# Patient Record
Sex: Female | Born: 1980
Health system: Southern US, Community
[De-identification: ages and names within clinical notes are randomized; demographics above are authoritative.]

## PROBLEM LIST (undated history)

## (undated) DIAGNOSIS — F419 Anxiety disorder, unspecified: Secondary | ICD-10-CM

## (undated) DIAGNOSIS — F329 Major depressive disorder, single episode, unspecified: Secondary | ICD-10-CM

## (undated) DIAGNOSIS — I10 Essential (primary) hypertension: Secondary | ICD-10-CM

## (undated) DIAGNOSIS — F32A Depression, unspecified: Secondary | ICD-10-CM

## (undated) HISTORY — PX: SPINE SURGERY: SHX786

## (undated) HISTORY — DX: Major depressive disorder, single episode, unspecified: F32.9

## (undated) HISTORY — DX: Depression, unspecified: F32.A

## (undated) HISTORY — PX: DILATION AND CURETTAGE OF UTERUS: SHX78

## (undated) HISTORY — DX: Essential (primary) hypertension: I10

## (undated) HISTORY — DX: Anxiety disorder, unspecified: F41.9

---
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
## 2004-01-18 ENCOUNTER — Ambulatory Visit: Payer: Self-pay | Admitting: Family Medicine

## 2004-05-09 ENCOUNTER — Ambulatory Visit: Payer: Self-pay | Admitting: Family Medicine

## 2004-05-12 ENCOUNTER — Ambulatory Visit: Payer: Self-pay | Admitting: *Deleted

## 2004-08-19 ENCOUNTER — Ambulatory Visit: Payer: Self-pay | Admitting: Family Medicine

## 2004-10-01 ENCOUNTER — Ambulatory Visit: Payer: Self-pay | Admitting: Internal Medicine

## 2004-10-03 ENCOUNTER — Ambulatory Visit (HOSPITAL_COMMUNITY): Admission: RE | Admit: 2004-10-03 | Discharge: 2004-10-03 | Payer: Self-pay | Admitting: Internal Medicine

## 2004-10-10 ENCOUNTER — Ambulatory Visit: Payer: Self-pay | Admitting: Family Medicine

## 2005-03-05 ENCOUNTER — Other Ambulatory Visit: Admission: RE | Admit: 2005-03-05 | Discharge: 2005-03-05 | Payer: Self-pay | Admitting: Obstetrics and Gynecology

## 2005-04-24 ENCOUNTER — Encounter (INDEPENDENT_AMBULATORY_CARE_PROVIDER_SITE_OTHER): Payer: Self-pay | Admitting: *Deleted

## 2005-04-24 ENCOUNTER — Ambulatory Visit (HOSPITAL_COMMUNITY): Admission: RE | Admit: 2005-04-24 | Discharge: 2005-04-24 | Payer: Self-pay | Admitting: Obstetrics and Gynecology

## 2005-08-25 ENCOUNTER — Ambulatory Visit: Payer: Self-pay | Admitting: Family Medicine

## 2005-10-22 ENCOUNTER — Ambulatory Visit: Payer: Self-pay | Admitting: Family Medicine

## 2006-02-10 ENCOUNTER — Ambulatory Visit: Payer: Self-pay | Admitting: Internal Medicine

## 2006-02-12 ENCOUNTER — Ambulatory Visit: Payer: Self-pay | Admitting: Family Medicine

## 2006-12-23 ENCOUNTER — Ambulatory Visit: Payer: Self-pay | Admitting: Family Medicine

## 2007-09-23 ENCOUNTER — Ambulatory Visit (HOSPITAL_COMMUNITY): Admission: RE | Admit: 2007-09-23 | Discharge: 2007-09-23 | Payer: Self-pay | Admitting: Obstetrics and Gynecology

## 2007-10-06 ENCOUNTER — Ambulatory Visit (HOSPITAL_COMMUNITY): Admission: RE | Admit: 2007-10-06 | Discharge: 2007-10-06 | Payer: Self-pay | Admitting: Obstetrics and Gynecology

## 2007-10-06 ENCOUNTER — Encounter (HOSPITAL_COMMUNITY): Payer: Self-pay | Admitting: Obstetrics and Gynecology

## 2008-06-22 ENCOUNTER — Emergency Department (HOSPITAL_COMMUNITY): Admission: EM | Admit: 2008-06-22 | Discharge: 2008-06-22 | Payer: Self-pay | Admitting: Family Medicine

## 2008-08-06 ENCOUNTER — Emergency Department (HOSPITAL_COMMUNITY): Admission: EM | Admit: 2008-08-06 | Discharge: 2008-08-06 | Payer: Self-pay | Admitting: Family Medicine

## 2009-02-04 ENCOUNTER — Emergency Department (HOSPITAL_COMMUNITY): Admission: EM | Admit: 2009-02-04 | Discharge: 2009-02-04 | Payer: Self-pay | Admitting: Family Medicine

## 2010-04-20 LAB — POCT URINALYSIS DIP (DEVICE)
Bilirubin Urine: NEGATIVE
Glucose, UA: NEGATIVE mg/dL
Hgb urine dipstick: NEGATIVE
Ketones, ur: NEGATIVE mg/dL
Nitrite: NEGATIVE
Protein, ur: NEGATIVE mg/dL
Specific Gravity, Urine: 1.025 (ref 1.005–1.030)
Urobilinogen, UA: 0.2 mg/dL (ref 0.0–1.0)
pH: 5.5 (ref 5.0–8.0)

## 2010-04-20 LAB — TSH: TSH: 2.052 u[IU]/mL (ref 0.350–4.500)

## 2010-04-20 LAB — DIFFERENTIAL
Basophils Absolute: 0 10*3/uL (ref 0.0–0.1)
Basophils Relative: 1 % (ref 0–1)
Eosinophils Absolute: 0.4 10*3/uL (ref 0.0–0.7)
Eosinophils Relative: 9 % — ABNORMAL HIGH (ref 0–5)
Lymphocytes Relative: 41 % (ref 12–46)
Lymphs Abs: 2.1 10*3/uL (ref 0.7–4.0)
Monocytes Absolute: 0.4 10*3/uL (ref 0.1–1.0)
Monocytes Relative: 9 % (ref 3–12)
Neutro Abs: 2.1 10*3/uL (ref 1.7–7.7)
Neutrophils Relative %: 41 % — ABNORMAL LOW (ref 43–77)

## 2010-04-20 LAB — CBC
HCT: 38.3 % (ref 36.0–46.0)
Hemoglobin: 13 g/dL (ref 12.0–15.0)
MCHC: 34 g/dL (ref 30.0–36.0)
MCV: 88.7 fL (ref 78.0–100.0)
Platelets: 214 10*3/uL (ref 150–400)
RBC: 4.32 MIL/uL (ref 3.87–5.11)
RDW: 13.9 % (ref 11.5–15.5)
WBC: 5.1 10*3/uL (ref 4.0–10.5)

## 2010-04-20 LAB — GC/CHLAMYDIA PROBE AMP, GENITAL
Chlamydia, DNA Probe: NEGATIVE
GC Probe Amp, Genital: NEGATIVE

## 2010-04-20 LAB — WET PREP, GENITAL
Clue Cells Wet Prep HPF POC: NONE SEEN
Trich, Wet Prep: NONE SEEN
WBC, Wet Prep HPF POC: NONE SEEN
Yeast Wet Prep HPF POC: NONE SEEN

## 2010-04-20 LAB — POCT PREGNANCY, URINE: Preg Test, Ur: NEGATIVE

## 2010-05-11 LAB — GC/CHLAMYDIA PROBE AMP, GENITAL
Chlamydia, DNA Probe: NEGATIVE
GC Probe Amp, Genital: POSITIVE — AB

## 2010-05-11 LAB — POCT PREGNANCY, URINE: Preg Test, Ur: NEGATIVE

## 2010-05-11 LAB — WET PREP, GENITAL: Trich, Wet Prep: NONE SEEN

## 2010-05-13 LAB — POCT PREGNANCY, URINE: Preg Test, Ur: NEGATIVE

## 2010-05-13 LAB — POCT RAPID STREP A (OFFICE): Streptococcus, Group A Screen (Direct): NEGATIVE

## 2010-06-17 NOTE — Op Note (Signed)
NAMESHAVON, ASHMORE                 ACCOUNT NO.:  [id number]   MEDICAL RECORD NO.:  [id number]          PATIENT TYPE:  AMB   LOCATION:  SDC                           FACILITY:  [facility]   PHYSICIAN:  Zelphia Cairo, MD    DATE OF BIRTH:  01/21/1981   DATE OF PROCEDURE:  DATE OF DISCHARGE:                               OPERATIVE REPORT   PREOPERATIVE DIAGNOSIS:  Anembryonic pregnancy.   POSTOPERATIVE DIAGNOSIS:  Anembryonic pregnancy.   PROCEDURES:  1. Cervical block.  2. Suction dilation and evacuation.   SURGEON:  Zelphia Cairo, MD   ANESTHESIA:  MAC and local.   SPECIMENS:  Products of conception to Pathology.   ESTIMATED BLOOD LOSS:  Minimal.   COMPLICATIONS:  None.   CONDITION:  Stable to recovery room.   PROCEDURE IN DETAIL:  The patient was taken to the operating room where  anesthesia was found to be adequate.  She was placed in the dorsal  lithotomy position using Allen stirrups. She was prepped and draped in  sterile fashion and a red rubber catheter was used to drain her bladder  for approximately 30 mL of clear urine.  Bivalve speculum was placed in  the vagina and a single-tooth tenaculum was placed on the anterior lip  of the cervix.  Cervical block was provided using Nesacaine.  The cervix  was then serially dilated with Shawnie Pons dilators and a 7-French suction  catheter was inserted into the uterine cavity and products of conception  were suctioned from the uterus.  A general curetting was then performed  to ensure uterine cry.  Suction catheter was reinserted to clear any  clots and debris.  Single-tooth tenaculum was removed from the cervix.  The cervix was found to be hemostatic.  Speculum was removed and the  patient was taken to the recovery room in stable condition.      Zelphia Cairo, MD  Electronically Signed     GA/MEDQ  D:  10/06/2007  T:  10/07/2007  Job:  [id number]

## 2010-06-20 NOTE — Op Note (Signed)
[name], [name]                 ACCOUNT NO.:  [id number]   MEDICAL RECORD NO.:  [id number]          PATIENT TYPE:  AMB   LOCATION:  SDC                           [facility]ACILITY:  [facility]   PHYSICIAN:  Zelphia Cairo, MD    DATE OF BIRTH:  08-05-1980   DATE OF PROCEDURE:  04/24/2005  DATE OF DISCHARGE:  04/24/2005                                 OPERATIVE REPORT   PREOPERATIVE DIAGNOSES:  1.  Menorrhagia.  2.  Endometrial polyp.   POSTOPERATIVE DIAGNOSES:  1.  Menorrhagia.  2.  Endometrial polyp.   OPERATION/PROCEDURE:  1.  Removal of NovaRing.  2.  Hysteroscopy.  3.  Dilatation and curettage.  4.  Polypectomy.   SURGEON:  Zelphia Cairo, M.D.   ASSISTANT:  None.   ANESTHESIA:  General.   SPECIMENS:  Endometrial curettage.   ESTIMATED BLOOD LOSS:  Minimal.   COMPLICATIONS:  None.   CONDITION:  Stable to recovery room.   DESCRIPTION OF PROCEDURE:  The patient was taken to the operating room where  MAC anesthesia was obtained.  During the sterile prep, the patient continued  to move and so general anesthesia was easily obtained with mask only.  She  was prepped in sterile fashion in the dorsal lithotomy position with Allen  stirrups.  She was sterilely draped and speculum was placed in the vagina  and NovaRing was visualized and removed.  Single-tooth tenaculum was placed  on the anterior lip of the cervix and the cervix sounded to 7.5 cm.  The  cervix was serially dilated using Pratt dilators.  Hysteroscope was then  inserted and the endometrial cavity was visualized.  There were two small  endometrial polyp noted on the lower cavity.  There were also several other  smaller polyps noted near the right fundus.  Both ostia were visualized and  appeared normal.  The hysteroscope was then removed and a curet was used to  do a D&C.  The hysteroscope was then reinserted.  There was one polyp  remaining towards the left.  The  hysteroscope was removed and the polyp forceps were  used to remove the polyp  without difficulty.  The patient tolerated the procedure well. Tenaculum and  speculum were removed from the vagina.  Excellent hemostasis was noted at  the tenaculum site.  She was taken to the recovery room in satisfactory  condition.      Zelphia Cairo, MD  Electronically Signed     GA/MEDQ  D:  04/24/2005  T:  04/27/2005  Job:  [id number]

## 2010-11-05 LAB — CBC
HCT: 32.8 — ABNORMAL LOW
Hemoglobin: 11 — ABNORMAL LOW
MCHC: 33.5
MCV: 87.4
Platelets: 215
RBC: 3.76 — ABNORMAL LOW
RDW: 14.5
WBC: 5.9

## 2011-11-11 ENCOUNTER — Ambulatory Visit: Payer: Self-pay | Admitting: Internal Medicine

## 2011-11-25 ENCOUNTER — Ambulatory Visit (INDEPENDENT_AMBULATORY_CARE_PROVIDER_SITE_OTHER): Payer: 59 | Admitting: Internal Medicine

## 2011-11-25 ENCOUNTER — Other Ambulatory Visit: Payer: Self-pay | Admitting: Internal Medicine

## 2011-11-25 ENCOUNTER — Encounter: Payer: Self-pay | Admitting: Internal Medicine

## 2011-11-25 ENCOUNTER — Ambulatory Visit (HOSPITAL_BASED_OUTPATIENT_CLINIC_OR_DEPARTMENT_OTHER)
Admission: RE | Admit: 2011-11-25 | Discharge: 2011-11-25 | Disposition: A | Discharge status: Home / Self Care | Payer: 59 | Source: Ambulatory Visit | Attending: Internal Medicine | Admitting: Internal Medicine

## 2011-11-25 VITALS — BP 170/118 | HR 99 | Temp 99.4°F | Resp 16 | Wt 165.0 lb

## 2011-11-25 DIAGNOSIS — E042 Nontoxic multinodular goiter: Secondary | ICD-10-CM

## 2011-11-25 DIAGNOSIS — L309 Dermatitis, unspecified: Secondary | ICD-10-CM

## 2011-11-25 DIAGNOSIS — N841 Polyp of cervix uteri: Secondary | ICD-10-CM

## 2011-11-25 DIAGNOSIS — E041 Nontoxic single thyroid nodule: Secondary | ICD-10-CM | POA: Insufficient documentation

## 2011-11-25 DIAGNOSIS — G4700 Insomnia, unspecified: Secondary | ICD-10-CM

## 2011-11-25 DIAGNOSIS — F418 Other specified anxiety disorders: Secondary | ICD-10-CM

## 2011-11-25 DIAGNOSIS — R3915 Urgency of urination: Secondary | ICD-10-CM

## 2011-11-25 DIAGNOSIS — Z23 Encounter for immunization: Secondary | ICD-10-CM

## 2011-11-25 DIAGNOSIS — L259 Unspecified contact dermatitis, unspecified cause: Secondary | ICD-10-CM

## 2011-11-25 DIAGNOSIS — I10 Essential (primary) hypertension: Secondary | ICD-10-CM

## 2011-11-25 DIAGNOSIS — F411 Generalized anxiety disorder: Secondary | ICD-10-CM

## 2011-11-25 LAB — CBC WITH DIFFERENTIAL/PLATELET
Basophils Absolute: 0 10*3/uL (ref 0.0–0.1)
Basophils Relative: 1 % (ref 0–1)
Eosinophils Absolute: 0.5 10*3/uL (ref 0.0–0.7)
Eosinophils Relative: 11 % — ABNORMAL HIGH (ref 0–5)
HCT: 35 % — ABNORMAL LOW (ref 36.0–46.0)
Hemoglobin: 12 g/dL (ref 12.0–15.0)
Lymphocytes Relative: 37 % (ref 12–46)
Lymphs Abs: 1.8 10*3/uL (ref 0.7–4.0)
MCH: 29.3 pg (ref 26.0–34.0)
MCHC: 34.3 g/dL (ref 30.0–36.0)
MCV: 85.6 fL (ref 78.0–100.0)
Monocytes Absolute: 0.3 10*3/uL (ref 0.1–1.0)
Monocytes Relative: 7 % (ref 3–12)
Neutro Abs: 2.2 10*3/uL (ref 1.7–7.7)
Neutrophils Relative %: 44 % (ref 43–77)
Platelets: 217 10*3/uL (ref 150–400)
RBC: 4.09 MIL/uL (ref 3.87–5.11)
RDW: 12.9 % (ref 11.5–15.5)
WBC: 4.9 10*3/uL (ref 4.0–10.5)

## 2011-11-25 LAB — COMPREHENSIVE METABOLIC PANEL
ALT: 12 U/L (ref 0–35)
AST: 15 U/L (ref 0–37)
Albumin: 4.2 g/dL (ref 3.5–5.2)
Alkaline Phosphatase: 45 U/L (ref 39–117)
BUN: 21 mg/dL (ref 6–23)
CO2: 26 mEq/L (ref 19–32)
Calcium: 9.4 mg/dL (ref 8.4–10.5)
Chloride: 110 mEq/L (ref 96–112)
Creat: 0.63 mg/dL (ref 0.50–1.10)
Glucose, Bld: 79 mg/dL (ref 70–99)
Potassium: 3.9 mEq/L (ref 3.5–5.3)
Sodium: 141 mEq/L (ref 135–145)
Total Bilirubin: 0.3 mg/dL (ref 0.3–1.2)
Total Protein: 7.1 g/dL (ref 6.0–8.3)

## 2011-11-25 LAB — T3, FREE: T3, Free: 2.6 pg/mL (ref 2.3–4.2)

## 2011-11-25 LAB — POCT URINALYSIS DIPSTICK
Bilirubin, UA: NEGATIVE
Blood, UA: NEGATIVE
Glucose, UA: NEGATIVE
Ketones, UA: NEGATIVE
Leukocytes, UA: NEGATIVE
Nitrite, UA: NEGATIVE
Protein, UA: NEGATIVE
Spec Grav, UA: 1.015
Urobilinogen, UA: NEGATIVE
pH, UA: 6

## 2011-11-25 LAB — LIPID PANEL
Cholesterol: 203 mg/dL — ABNORMAL HIGH (ref 0–200)
HDL: 56 mg/dL (ref >39)
LDL Cholesterol: 137 mg/dL — ABNORMAL HIGH (ref 0–99)
Total CHOL/HDL Ratio: 3.6 Ratio
Triglycerides: 48 mg/dL (ref <150)
VLDL: 10 mg/dL (ref 0–40)

## 2011-11-25 LAB — TSH: TSH: 0.772 u[IU]/mL (ref 0.350–4.500)

## 2011-11-25 LAB — T4, FREE: Free T4: 1.13 ng/dL (ref 0.80–1.80)

## 2011-11-25 MED ORDER — DILTIAZEM HCL ER COATED BEADS 300 MG PO CP24: 300.0000 mg | ORAL_CAPSULE | Freq: Every day | ORAL | Status: DC

## 2011-11-25 NOTE — Progress Notes (Signed)
  Subjective:    Patient ID: Candace [name], female    DOB: [birth year], 31 y.o.   MRN: [id number]  HPI New pt here for first visit.  Bernadene works for [employer] Former care Dr. Renaldo Fiddler GYN.  PMH of situational anxiety, insomnia, urinary urgency, eczema, and cervical polyp  Pt is concerned over elevated BP.  She denies chest pain, headache, dizziness or Le edema.  She reports she has had her BP checked at work and the "top number has been 160-170"  Father and MGM both have htn.  She also has urinary urgency but no dysuria.  No flank pain  She also needs a pap smear.  She has a new sexual partner and has been tested neg for HIV   No Known Allergies Past Medical History  Diagnosis Date  . Depression   . Anxiety   . Hypertension    Past Surgical History  Procedure Date  . Dilation and curettage of uterus    History   Social History  . Marital Status: Single    Spouse Name: N/A    Number of Children: N/A  . Years of Education: N/A   Occupational History  . Not on file.   Social History Main Topics  . Smoking status: Never Smoker   . Smokeless tobacco: Not on file  . Alcohol Use: No  . Drug Use: No  . Sexually Active: Yes    Birth Control/ Protection: IUD   Other Topics Concern  . Not on file   Social History Narrative  . No narrative on file   Family History  Problem Relation Age of Onset  . Diabetes Father   . Hypertension Father   . Hypertension Sister   . Diabetes Maternal Grandmother   . Hypertension Maternal Grandmother    Patient Active Problem List  Diagnosis  . Essential hypertension, benign   Current Outpatient Prescriptions on File Prior to Visit  Medication Sig Dispense Refill  . zolpidem (AMBIEN) 5 MG tablet Take 5 mg by mouth at bedtime as needed.            Review of Systems    see HPI Objective:   Physical Exam  Physical Exam  Nursing note and vitals reviewed.  Constitutional: She is oriented to person, place, and time. She appears  well-developed and well-nourished.  HENT:  Head: Normocephalic and atraumatic.  Neck: slight R sided thyroid enlargement.  No palpable nodules Cardiovascular: Normal rate and regular rhythm. Exam reveals no gallop and no friction rub.  No murmur heard.  Pulmonary/Chest: Breath sounds normal. She has no wheezes. She has no rales.  Neurological: She is alert and oriented to person, place, and time.  Skin: Skin is warm and dry.  Psychiatric: She has a normal mood and affect. Her behavior is normal.  Ext:  No edema            Assessment & Plan:  HTN:  New onset  Will start Cardizem  300 mg daily.  Advised DASH diet.  Recheck in 4-6 weeks.  Will need EKG at that time  Urinary urgency  Urine exam today is normal  R sided thyroid enlargement will check all labs today and get thryroid ultrasound  Eczema  Situational anxiety inactive now.    Insomnia  Likely related to above  Will need CPE .  Advised with cervical polyps to see her GYN and sign for those notes  Flu vaccine today

## 2011-11-25 NOTE — Patient Instructions (Addendum)
Flu vaccine given today  Take your BP pill everyday  See me in 2-3 weeks for pap smear

## 2011-11-26 ENCOUNTER — Telehealth: Payer: Self-pay | Admitting: Internal Medicine

## 2011-11-26 DIAGNOSIS — R3915 Urgency of urination: Secondary | ICD-10-CM | POA: Insufficient documentation

## 2011-11-26 DIAGNOSIS — F418 Other specified anxiety disorders: Secondary | ICD-10-CM | POA: Insufficient documentation

## 2011-11-26 DIAGNOSIS — N841 Polyp of cervix uteri: Secondary | ICD-10-CM | POA: Insufficient documentation

## 2011-11-26 DIAGNOSIS — G4700 Insomnia, unspecified: Secondary | ICD-10-CM | POA: Insufficient documentation

## 2011-11-26 DIAGNOSIS — E042 Nontoxic multinodular goiter: Secondary | ICD-10-CM | POA: Insufficient documentation

## 2011-11-26 DIAGNOSIS — L309 Dermatitis, unspecified: Secondary | ICD-10-CM | POA: Insufficient documentation

## 2011-11-26 LAB — VITAMIN D 25 HYDROXY (VIT D DEFICIENCY, FRACTURES): Vit D, 25-Hydroxy: 18 ng/mL — ABNORMAL LOW (ref 30–89)

## 2011-11-26 LAB — RPR

## 2011-11-26 NOTE — Telephone Encounter (Signed)
Candace [name]  Call labs and add an RPR to labs done yesterday

## 2011-11-27 NOTE — Telephone Encounter (Signed)
RPR added to labs

## 2011-11-30 ENCOUNTER — Telehealth: Payer: Self-pay | Admitting: *Deleted

## 2011-11-30 ENCOUNTER — Encounter: Payer: Self-pay | Admitting: *Deleted

## 2011-11-30 NOTE — Telephone Encounter (Signed)
Message copied by Mathews Robinsons on Mon Nov 30, 2011  4:24 PM ------      Message from: Raechel Chute D      Created: Fri Nov 27, 2011 10:18 AM       Ok to mail labs to pt            Write on letter that pt should take 2000 units of vitamin  D daily and I will recheck when she comes in for her physical

## 2011-11-30 NOTE — Telephone Encounter (Signed)
Copy of labs sent to pts home address with instructions to take Vit D 2000 units daily

## 2011-12-01 ENCOUNTER — Telehealth: Payer: Self-pay | Admitting: Internal Medicine

## 2011-12-01 DIAGNOSIS — E042 Nontoxic multinodular goiter: Secondary | ICD-10-CM | POA: Insufficient documentation

## 2011-12-01 NOTE — Telephone Encounter (Signed)
Left message on home number to return call

## 2011-12-01 NOTE — Telephone Encounter (Signed)
Spoke with pt. And informed of all labs tests and thyroid ultrasound  Will check ultrasound once a year  Advised pt to get her BP med today as she has not started on medication as yet.  Advised to see me in 4-6 weeks  She voices understanding

## 2011-12-29 ENCOUNTER — Telehealth: Payer: Self-pay | Admitting: *Deleted

## 2011-12-29 NOTE — Telephone Encounter (Signed)
Immunization record faxed to [facility] Art Nursing Secretary will take to pt

## 2012-01-20 ENCOUNTER — Encounter: Payer: Self-pay | Admitting: *Deleted

## 2012-01-21 NOTE — Progress Notes (Signed)
Patient attended the [facility]: Hypertension/High Cholesterol nutrition class on 01/20/12.  Topics covered include:   1. Complications of Hyperlipidemia and/or Hypertension. 2. Ways to reduce risk of heart disease.  3. Identifying fat and sodium content on food labels. 4. Ways to decrease sodium intake. 5. Optimal amount of daily saturated fat intake. 6. Optimal amount of daily sodium intake.  7. Foods to limit/avoid on a heart healthy diet.  Patient to follow-up with [facility] prn. 

## 2012-02-18 ENCOUNTER — Telehealth: Payer: Self-pay | Admitting: Internal Medicine

## 2012-02-18 NOTE — Telephone Encounter (Signed)
Pt states she has questions about a medication that prescribe to her.. She ask if someone can call her on her cell [phone] (feel free to leave a message)... Or you can try her at work.. [phone]

## 2012-02-22 ENCOUNTER — Telehealth: Payer: Self-pay | Admitting: *Deleted

## 2012-02-22 NOTE — Telephone Encounter (Signed)
Made appt for pt to discuss BP meds

## 2012-02-24 ENCOUNTER — Ambulatory Visit: Payer: 59 | Admitting: Internal Medicine

## 2012-03-02 ENCOUNTER — Ambulatory Visit: Payer: 59 | Admitting: Internal Medicine

## 2012-04-27 ENCOUNTER — Encounter: Payer: Self-pay | Admitting: Internal Medicine

## 2012-04-27 ENCOUNTER — Ambulatory Visit (INDEPENDENT_AMBULATORY_CARE_PROVIDER_SITE_OTHER): Payer: 59 | Admitting: Internal Medicine

## 2012-04-27 VITALS — BP 150/100 | HR 84 | Temp 98.0°F | Resp 18 | Wt 174.0 lb

## 2012-04-27 DIAGNOSIS — I10 Essential (primary) hypertension: Secondary | ICD-10-CM

## 2012-04-27 DIAGNOSIS — R600 Localized edema: Secondary | ICD-10-CM | POA: Insufficient documentation

## 2012-04-27 DIAGNOSIS — R609 Edema, unspecified: Secondary | ICD-10-CM

## 2012-04-27 MED ORDER — LOSARTAN POTASSIUM-HCTZ 50-12.5 MG PO TABS: 1.0000 | ORAL_TABLET | Freq: Every day | ORAL | Status: DC

## 2012-04-27 NOTE — Progress Notes (Signed)
  Subjective:    Patient ID: Candace [name], female    DOB: 08-13-1980, 32 y.o.   MRN: [id number]  HPI [name] is here for follow up  I have not seen her since October.  She reports she took 3 days of her Cardizem and felt that it gave her a headache so she quit taking it.  She has not seen me since despite my counsel to return for recheck  She denies headache, chest pain  .  She does have LE bilateral edema off and on  No Known Allergies Past Medical History  Diagnosis Date  . Depression   . Anxiety   . Hypertension    Past Surgical History  Procedure Laterality Date  . Dilation and curettage of uterus    . Spine surgery     History   Social History  . Marital Status: Single    Spouse Name: N/A    Number of Children: N/A  . Years of Education: N/A   Occupational History  . Not on file.   Social History Main Topics  . Smoking status: Never Smoker   . Smokeless tobacco: Not on file  . Alcohol Use: No  . Drug Use: No  . Sexually Active: Yes    Birth Control/ Protection: IUD   Other Topics Concern  . Not on file   Social History Narrative  . No narrative on file   Family History  Problem Relation Age of Onset  . Diabetes Father   . Hypertension Father   . Hypertension Sister   . Diabetes Maternal Grandmother   . Hypertension Maternal Grandmother    Patient Active Problem List  Diagnosis  . Essential hypertension, benign  . Situational anxiety  . Insomnia  . Urinary urgency  . Cervical polyp  . Eczema  . Multiple thyroid nodules  . Multinodular thyroid   Current Outpatient Prescriptions on File Prior to Visit  Medication Sig Dispense Refill  . ALPRAZolam (XANAX) 1 MG tablet Take 1 mg by mouth at bedtime as needed.      Marland Kitchen BEE POLLEN PO Take 83 mg by mouth 2 (two) times daily.      . Multiple Vitamin (MULTIVITAMIN) capsule Take 1 capsule by mouth daily.      Marland Kitchen zolpidem (AMBIEN) 5 MG tablet Take 5 mg by mouth at bedtime as needed.       No current  facility-administered medications on file prior to visit.       Review of Systems See HPI    Objective:   Physical Exam Physical Exam  Nursing note and vitals reviewed.  Constitutional: She is oriented to person, place, and time. She appears well-developed and well-nourished.  HENT:  Head: Normocephalic and atraumatic.  Cardiovascular: Normal rate and regular rhythm. Exam reveals no gallop and no friction rub.  No murmur heard.  Pulmonary/Chest: Breath sounds normal. She has no wheezes. She has no rales.  Neurological: She is alert and oriented to person, place, and time.  Skin: Skin is warm and dry.  Ext  Trace edema Psychiatric: She has a normal mood and affect. Her behavior is normal.        Assessment & Plan:  HTN   Will give Hyzaar 50/12.5  Again counseled of complications of untreated HTN including death and the importance of taking daily meds  LE edema  See above  She is to see me in 3-4 weeks

## 2012-05-18 ENCOUNTER — Ambulatory Visit: Payer: 59 | Admitting: Internal Medicine

## 2012-06-15 ENCOUNTER — Encounter: Payer: Self-pay | Admitting: Internal Medicine

## 2012-06-15 ENCOUNTER — Ambulatory Visit (INDEPENDENT_AMBULATORY_CARE_PROVIDER_SITE_OTHER): Payer: 59 | Admitting: Internal Medicine

## 2012-06-15 VITALS — BP 138/86 | HR 88 | Temp 98.7°F | Resp 16 | Wt 179.0 lb

## 2012-06-15 DIAGNOSIS — E042 Nontoxic multinodular goiter: Secondary | ICD-10-CM

## 2012-06-15 DIAGNOSIS — I10 Essential (primary) hypertension: Secondary | ICD-10-CM

## 2012-06-15 NOTE — Progress Notes (Signed)
Subjective:    Patient ID: Candace [name], female    DOB: 05-Mar-1980, 32 y.o.   MRN: [id number]  HPI  [name] is here for follow up.  I last saw her in March and placed her on Losartan/HCTZ.  She tells me she that she has been on her medication for 3-4 weeks only.  She does not keep her appointments as advised.  She tells me "it is probably my fault as I work a lot of overtime"    She denies headache, visual changes, numbness or muscle weakness  No Known Allergies Past Medical History  Diagnosis Date  . Depression   . Anxiety   . Hypertension    Past Surgical History  Procedure Laterality Date  . Dilation and curettage of uterus    . Spine surgery     History   Social History  . Marital Status: Single    Spouse Name: N/A    Number of Children: N/A  . Years of Education: N/A   Occupational History  . Not on file.   Social History Main Topics  . Smoking status: Never Smoker   . Smokeless tobacco: Not on file  . Alcohol Use: No  . Drug Use: No  . Sexually Active: Yes    Birth Control/ Protection: IUD   Other Topics Concern  . Not on file   Social History Narrative  . No narrative on file   Family History  Problem Relation Age of Onset  . Diabetes Father   . Hypertension Father   . Hypertension Sister   . Diabetes Maternal Grandmother   . Hypertension Maternal Grandmother    Patient Active Problem List   Diagnosis Date Noted  . Edema extremities 04/27/2012  . Multinodular thyroid 12/01/2011  . Situational anxiety 11/26/2011  . Insomnia 11/26/2011  . Urinary urgency 11/26/2011  . Cervical polyp 11/26/2011  . Eczema 11/26/2011  . Multiple thyroid nodules 11/26/2011  . Essential hypertension, benign 11/25/2011   Current Outpatient Prescriptions on File Prior to Visit  Medication Sig Dispense Refill  . ALPRAZolam (XANAX) 1 MG tablet Take 1 mg by mouth at bedtime as needed.      Marland Kitchen BEE POLLEN PO Take 83 mg by mouth 2 (two) times daily.      Marland Kitchen  losartan-hydrochlorothiazide (HYZAAR) 50-12.5 MG per tablet Take 1 tablet by mouth daily.  90 tablet  3  . zolpidem (AMBIEN) 5 MG tablet Take 5 mg by mouth at bedtime as needed.      . Multiple Vitamin (MULTIVITAMIN) capsule Take 1 capsule by mouth daily.       No current facility-administered medications on file prior to visit.      Review of Systems See HPI    Objective:   Physical Exam Physical Exam  Nursing note and vitals reviewed.  Constitutional: She is oriented to person, place, and time. She appears well-developed and well-nourished.  HENT:  Head: Normocephalic and atraumatic.  Cardiovascular: Normal rate and regular rhythm. Exam reveals no gallop and no friction rub.  No murmur heard.  Pulmonary/Chest: Breath sounds normal. She has no wheezes. She has no rales.  Neurological: She is alert and oriented to person, place, and time.  Skin: Skin is warm and dry.  Psychiatric: She has a normal mood and affect. Her behavior is normal.         Assessment & Plan:  HTN:  EKG today  Shows LAE  Counseled again to take medication every day. Continue meds See me in  3-4 months  Left atrial enlargement on EKG

## 2012-09-14 ENCOUNTER — Ambulatory Visit: Payer: 59 | Admitting: Internal Medicine

## 2012-09-14 DIAGNOSIS — Z09 Encounter for follow-up examination after completed treatment for conditions other than malignant neoplasm: Secondary | ICD-10-CM

## 2012-11-21 ENCOUNTER — Telehealth: Payer: Self-pay | Admitting: *Deleted

## 2012-11-21 ENCOUNTER — Ambulatory Visit (INDEPENDENT_AMBULATORY_CARE_PROVIDER_SITE_OTHER): Payer: 59 | Admitting: Internal Medicine

## 2012-11-21 ENCOUNTER — Encounter: Payer: Self-pay | Admitting: Internal Medicine

## 2012-11-21 VITALS — BP 148/96 | HR 88 | Temp 98.4°F | Resp 16 | Wt 192.0 lb

## 2012-11-21 DIAGNOSIS — F418 Other specified anxiety disorders: Secondary | ICD-10-CM

## 2012-11-21 DIAGNOSIS — E041 Nontoxic single thyroid nodule: Secondary | ICD-10-CM

## 2012-11-21 DIAGNOSIS — F411 Generalized anxiety disorder: Secondary | ICD-10-CM

## 2012-11-21 DIAGNOSIS — F4321 Adjustment disorder with depressed mood: Secondary | ICD-10-CM | POA: Insufficient documentation

## 2012-11-21 DIAGNOSIS — I10 Essential (primary) hypertension: Secondary | ICD-10-CM

## 2012-11-21 LAB — CBC WITH DIFFERENTIAL/PLATELET
Basophils Absolute: 0 10*3/uL (ref 0.0–0.1)
Basophils Relative: 1 % (ref 0–1)
Eosinophils Absolute: 0.5 10*3/uL (ref 0.0–0.7)
Eosinophils Relative: 10 % — ABNORMAL HIGH (ref 0–5)
HCT: 37.6 % (ref 36.0–46.0)
Hemoglobin: 13 g/dL (ref 12.0–15.0)
Lymphocytes Relative: 32 % (ref 12–46)
Lymphs Abs: 1.5 10*3/uL (ref 0.7–4.0)
MCH: 30.1 pg (ref 26.0–34.0)
MCHC: 34.6 g/dL (ref 30.0–36.0)
MCV: 87 fL (ref 78.0–100.0)
Monocytes Absolute: 0.4 10*3/uL (ref 0.1–1.0)
Monocytes Relative: 8 % (ref 3–12)
Neutro Abs: 2.3 10*3/uL (ref 1.7–7.7)
Neutrophils Relative %: 49 % (ref 43–77)
Platelets: 247 10*3/uL (ref 150–400)
RBC: 4.32 MIL/uL (ref 3.87–5.11)
RDW: 13.4 % (ref 11.5–15.5)
WBC: 4.6 10*3/uL (ref 4.0–10.5)

## 2012-11-21 LAB — COMPREHENSIVE METABOLIC PANEL
ALT: 10 U/L (ref 0–35)
AST: 14 U/L (ref 0–37)
Albumin: 4.3 g/dL (ref 3.5–5.2)
Alkaline Phosphatase: 54 U/L (ref 39–117)
BUN: 20 mg/dL (ref 6–23)
CO2: 26 mEq/L (ref 19–32)
Calcium: 9.2 mg/dL (ref 8.4–10.5)
Chloride: 105 mEq/L (ref 96–112)
Creat: 0.75 mg/dL (ref 0.50–1.10)
Glucose, Bld: 90 mg/dL (ref 70–99)
Potassium: 3.9 mEq/L (ref 3.5–5.3)
Sodium: 138 mEq/L (ref 135–145)
Total Bilirubin: 0.3 mg/dL (ref 0.3–1.2)
Total Protein: 7.3 g/dL (ref 6.0–8.3)

## 2012-11-21 LAB — LIPID PANEL
Cholesterol: 179 mg/dL (ref 0–200)
HDL: 58 mg/dL (ref >39)
LDL Cholesterol: 110 mg/dL — ABNORMAL HIGH (ref 0–99)
Total CHOL/HDL Ratio: 3.1 Ratio
Triglycerides: 54 mg/dL (ref <150)
VLDL: 11 mg/dL (ref 0–40)

## 2012-11-21 MED ORDER — METOPROLOL-HYDROCHLOROTHIAZIDE 50-25 MG PO TABS: 1.0000 | ORAL_TABLET | Freq: Every day | ORAL | Status: DC

## 2012-11-21 MED ORDER — ESZOPICLONE 2 MG PO TABS: ORAL_TABLET | ORAL | Status: DC

## 2012-11-21 MED ORDER — BUPROPION HCL 75 MG PO TABS: ORAL_TABLET | ORAL | Status: DC

## 2012-11-21 NOTE — Patient Instructions (Addendum)
Take your blood pressure pill every day  Metoprolol/HCTZ    Dr. Evelene Croon :  [phone] Dr. Nolen Mu  [phone]  Can ask for appt with nurse practitioner  Vonna Kotyk :  [phone]  Therapist Jeannie Done:  [phone]  Therapist  See me in 2 weeks 30 min  Stop by xray to schedule thyroid ultrasound

## 2012-11-21 NOTE — Telephone Encounter (Signed)
Notified pt of Dr Lonell Face offer to refer pt to [facility] and to write a doctors note pt will call back with an answer

## 2012-11-21 NOTE — Telephone Encounter (Signed)
Pt states that she has called and that appt's she has tried are 4-5 weeks. And she feels like she needs some time off from work. Pt would like for Dr Constance Goltz would reconsider signing FMLA papers

## 2012-11-21 NOTE — Progress Notes (Signed)
Subjective:    Patient ID: Candace [name], female    DOB: May 22, 1980, 32 y.o.   MRN: [id number]  HPI Candace [name] is here for follow up  She has not had her BP pill in 2 months  She is asymptomatic  She reports symptoms of depression. Grandmother died approx 4 weeks ago of advanced dementia.  Conflict with mother - mother not speaking to her.  Daily sad mood for the past 2-3 weeks,  Difficulty concentrating at work,  Feels guilt,  Insomnia  And "cannot get started - don't want to go to work"  No S/H  Ideation, plan or intent  She works on [employer] team and does not want to see an MD affilliated with behavioral health  She is aware she can connect with hospice bereavement team  She is due for a repeat thyroid ultraosund  No Known Allergies Past Medical History  Diagnosis Date  . Depression   . Anxiety   . Hypertension    Past Surgical History  Procedure Laterality Date  . Dilation and curettage of uterus    . Spine surgery     History   Social History  . Marital Status: Single    Spouse Name: N/A    Number of Children: N/A  . Years of Education: N/A   Occupational History  . Not on file.   Social History Main Topics  . Smoking status: Never Smoker   . Smokeless tobacco: Not on file  . Alcohol Use: No  . Drug Use: No  . Sexual Activity: Yes    Birth Control/ Protection: IUD   Other Topics Concern  . Not on file   Social History Narrative  . No narrative on file   Family History  Problem Relation Age of Onset  . Diabetes Father   . Hypertension Father   . Hypertension Sister   . Diabetes Maternal Grandmother   . Hypertension Maternal Grandmother    Patient Active Problem List   Diagnosis Date Noted  . Edema extremities 04/27/2012  . Multinodular thyroid 12/01/2011  . Situational anxiety 11/26/2011  . Insomnia 11/26/2011  . Urinary urgency 11/26/2011  . Cervical polyp 11/26/2011  . Eczema 11/26/2011  . Multiple thyroid nodules 11/26/2011  .  Essential hypertension, benign 11/25/2011   Current Outpatient Prescriptions on File Prior to Visit  Medication Sig Dispense Refill  . ALPRAZolam (XANAX) 1 MG tablet Take 1 mg by mouth at bedtime as needed.      Marland Kitchen BEE POLLEN PO Take 83 mg by mouth 2 (two) times daily.      Marland Kitchen losartan-hydrochlorothiazide (HYZAAR) 50-12.5 MG per tablet Take 1 tablet by mouth daily.  90 tablet  3  . Multiple Vitamin (MULTIVITAMIN) capsule Take 1 capsule by mouth daily.      Marland Kitchen zolpidem (AMBIEN) 5 MG tablet Take 5 mg by mouth at bedtime as needed.       No current facility-administered medications on file prior to visit.       Review of Systems See HPI    Objective:   Physical Exam Physical Exam  Nursing note and vitals reviewed.  Constitutional: She is oriented to person, place, and time. She appears well-developed and well-nourished.  HENT:  Head: Normocephalic and atraumatic.  Cardiovascular: Normal rate and regular rhythm. Exam reveals no gallop and no friction rub.  No murmur heard.  Pulmonary/Chest: Breath sounds normal. She has no wheezes. She has no rales.  Neurological: She is alert and oriented to person, place, and  time.  Skin: Skin is warm and dry.  Psychiatric: She has a normal mood and affect. Her behavior is normal.        Assessment & Plan:  Situational depression/Bereavement    She has no history of  Seizure disorder and would like to try Wellbutrin.  Will start at 75 mg dose for one week and then increase to 150 mg.  I gave number to both Dr. Evelene Croon and Dr. Nolen Mu and advised pt to call for appt.  I also gave number to two therapists and advised pt to call along with calling local Hospice for bereavement counseling.    Check TSH and all labs today  2)  HTN  I again counseled of the importance of not running out of med   3)  Nodular thyroid  Will schedule thyroid U/S  4)  Insomnia related to #1  Will give Lunesta 2 mg  #12 advised to take 2-3 times per week and no more  often  See me in two weeks

## 2012-11-21 NOTE — Telephone Encounter (Signed)
Karen Kitchens  Let pt know that we can refer her to [facility] if this is quicker for her.    If she has worsening symptoms she needs to be evaluated by urgent care or ER.   I can write an MD note that she can have a few days off of work  But I do not do psychiatric FMLA

## 2012-11-22 ENCOUNTER — Encounter: Payer: Self-pay | Admitting: *Deleted

## 2012-11-22 LAB — TSH: TSH: 1.991 u[IU]/mL (ref 0.350–4.500)

## 2012-11-22 LAB — VITAMIN D 25 HYDROXY (VIT D DEFICIENCY, FRACTURES): Vit D, 25-Hydroxy: 23 ng/mL — ABNORMAL LOW (ref 30–89)

## 2012-11-30 ENCOUNTER — Telehealth: Payer: Self-pay | Admitting: *Deleted

## 2012-11-30 NOTE — Telephone Encounter (Signed)
Called and left Evan a message

## 2012-11-30 NOTE — Telephone Encounter (Signed)
Called Candace [name] and left her a message about the follow up US of her neck that was ordered and needs to be scheduled.  I left her Radiology's # to call and schedule her appt at her convenience.

## 2012-12-07 ENCOUNTER — Ambulatory Visit: Payer: 59 | Admitting: Internal Medicine

## 2012-12-14 ENCOUNTER — Ambulatory Visit: Payer: 59 | Admitting: Internal Medicine

## 2013-01-01 ENCOUNTER — Encounter: Payer: Self-pay | Admitting: Internal Medicine

## 2013-01-02 ENCOUNTER — Telehealth: Payer: Self-pay | Admitting: Internal Medicine

## 2013-01-02 NOTE — Telephone Encounter (Signed)
Candace [name]  Call pt and give her a follow up appt for her depression - I note she cancelled her last two follow up appts  I referred her to a psychiatrist  -  Document in chart if she made an appt  I also ordered a  thyroid ultrasound  ( this is a repeat exam to follow from 1 year ago)  I do not see where an appt was made  .   Please schedule one for her

## 2013-01-03 NOTE — Telephone Encounter (Signed)
Called pt to remind her of follow up appt regarding her thyroid US and follow up/ depression

## 2013-02-08 ENCOUNTER — Encounter (HOSPITAL_COMMUNITY): Payer: Self-pay | Admitting: Pharmacist

## 2013-02-08 ENCOUNTER — Encounter (HOSPITAL_COMMUNITY): Payer: Self-pay | Admitting: *Deleted

## 2013-02-08 ENCOUNTER — Ambulatory Visit: Payer: 59 | Admitting: Internal Medicine

## 2013-02-09 MED ORDER — DEXTROSE 5 % IV SOLN
2.0000 g | INTRAVENOUS | Status: AC
  Administered 2013-02-10: 2 g via INTRAVENOUS
  Filled 2013-02-09: qty 2

## 2013-02-09 NOTE — H&P (Addendum)
[age] yo G3P0 with missed Ab presents for surgical mngt  PMHx: CHTN,  PSHx: D&E, polypectomy All:  None Meds:  Losartan, wellbutrin, ambien SHx: neg tobacco, + etoh FHx: n/c  AF, VSS Gen - NAD ABd - soft, NT CV - RRR Lungs - clear PV - deferred  US - IUP without FHT, 7wks by CRL  A/P:  Missed Ab D&E R/b/a discussed, questions answered, informed consent

## 2013-02-10 ENCOUNTER — Ambulatory Visit (HOSPITAL_COMMUNITY): Payer: 59

## 2013-02-10 ENCOUNTER — Ambulatory Visit (HOSPITAL_COMMUNITY)
Admission: RE | Admit: 2013-02-10 | Discharge: 2013-02-10 | Disposition: A | Discharge status: Home / Self Care | Payer: 59 | Source: Ambulatory Visit | Attending: Obstetrics and Gynecology | Admitting: Obstetrics and Gynecology

## 2013-02-10 ENCOUNTER — Encounter (HOSPITAL_COMMUNITY)
Admission: RE | Disposition: A | Discharge status: Home / Self Care | Payer: Self-pay | Source: Ambulatory Visit | Attending: Obstetrics and Gynecology

## 2013-02-10 ENCOUNTER — Encounter (HOSPITAL_COMMUNITY): Payer: Self-pay | Admitting: *Deleted

## 2013-02-10 ENCOUNTER — Ambulatory Visit (HOSPITAL_COMMUNITY): Payer: 59 | Admitting: Anesthesiology

## 2013-02-10 ENCOUNTER — Encounter (HOSPITAL_COMMUNITY): Payer: 59 | Admitting: Anesthesiology

## 2013-02-10 DIAGNOSIS — O021 Missed abortion: Secondary | ICD-10-CM | POA: Insufficient documentation

## 2013-02-10 DIAGNOSIS — F17200 Nicotine dependence, unspecified, uncomplicated: Secondary | ICD-10-CM | POA: Insufficient documentation

## 2013-02-10 HISTORY — PX: DILATION AND EVACUATION: SHX1459

## 2013-02-10 LAB — BASIC METABOLIC PANEL
BUN: 17 mg/dL (ref 6–23)
CO2: 25 mEq/L (ref 19–32)
Calcium: 9.9 mg/dL (ref 8.4–10.5)
Chloride: 100 mEq/L (ref 96–112)
Creatinine, Ser: 0.66 mg/dL (ref 0.50–1.10)
GFR calc Af Amer: >90 mL/min (ref >90)
GFR calc non Af Amer: >90 mL/min (ref >90)
Glucose, Bld: 103 mg/dL — ABNORMAL HIGH (ref 70–99)
Potassium: 3.8 mEq/L (ref 3.7–5.3)
Sodium: 138 mEq/L (ref 137–147)

## 2013-02-10 LAB — CBC
HCT: 35.6 % — ABNORMAL LOW (ref 36.0–46.0)
Hemoglobin: 12.1 g/dL (ref 12.0–15.0)
MCH: 29.2 pg (ref 26.0–34.0)
MCHC: 34 g/dL (ref 30.0–36.0)
MCV: 85.8 fL (ref 78.0–100.0)
Platelets: 202 10*3/uL (ref 150–400)
RBC: 4.15 MIL/uL (ref 3.87–5.11)
RDW: 12.9 % (ref 11.5–15.5)
WBC: 4.8 10*3/uL (ref 4.0–10.5)

## 2013-02-10 SURGERY — DILATION AND EVACUATION, UTERUS
Anesthesia: Monitor Anesthesia Care

## 2013-02-10 MED ORDER — FENTANYL CITRATE 0.05 MG/ML IJ SOLN
INTRAMUSCULAR | Status: DC | PRN
  Administered 2013-02-10: 25 ug via INTRAVENOUS
  Administered 2013-02-10 (×2): 50 ug via INTRAVENOUS
  Administered 2013-02-10: 25 ug via INTRAVENOUS

## 2013-02-10 MED ORDER — PROMETHAZINE HCL 25 MG/ML IJ SOLN: 6.2500 mg | INTRAMUSCULAR | Status: DC | PRN

## 2013-02-10 MED ORDER — HYDROCODONE-IBUPROFEN 7.5-200 MG PO TABS: 1.0000 | ORAL_TABLET | Freq: Four times a day (QID) | ORAL | Status: DC | PRN

## 2013-02-10 MED ORDER — GLYCOPYRROLATE 0.2 MG/ML IJ SOLN
INTRAMUSCULAR | Status: DC | PRN
  Administered 2013-02-10 (×2): 0.1 mg via INTRAVENOUS

## 2013-02-10 MED ORDER — FENTANYL CITRATE 0.05 MG/ML IJ SOLN
INTRAMUSCULAR | Status: AC
  Filled 2013-02-10: qty 5

## 2013-02-10 MED ORDER — MIDAZOLAM HCL 2 MG/2ML IJ SOLN
INTRAMUSCULAR | Status: AC
  Filled 2013-02-10: qty 2

## 2013-02-10 MED ORDER — LACTATED RINGERS IV SOLN
INTRAVENOUS | Status: DC
  Administered 2013-02-10 (×2): via INTRAVENOUS

## 2013-02-10 MED ORDER — MEPERIDINE HCL 25 MG/ML IJ SOLN: 6.2500 mg | INTRAMUSCULAR | Status: DC | PRN

## 2013-02-10 MED ORDER — ONDANSETRON HCL 4 MG/2ML IJ SOLN
INTRAMUSCULAR | Status: DC | PRN
  Administered 2013-02-10: 4 mg via INTRAVENOUS

## 2013-02-10 MED ORDER — MIDAZOLAM HCL 2 MG/2ML IJ SOLN
INTRAMUSCULAR | Status: DC | PRN
  Administered 2013-02-10: 2 mg via INTRAVENOUS

## 2013-02-10 MED ORDER — KETOROLAC TROMETHAMINE 30 MG/ML IJ SOLN
INTRAMUSCULAR | Status: DC | PRN
  Administered 2013-02-10: 30 mg via INTRAVENOUS

## 2013-02-10 MED ORDER — PROPOFOL 10 MG/ML IV EMUL
INTRAVENOUS | Status: AC
  Filled 2013-02-10: qty 40

## 2013-02-10 MED ORDER — METHYLERGONOVINE MALEATE 0.2 MG PO TABS: 0.2000 mg | ORAL_TABLET | Freq: Three times a day (TID) | ORAL | Status: DC

## 2013-02-10 MED ORDER — LIDOCAINE HCL (CARDIAC) 20 MG/ML IV SOLN
INTRAVENOUS | Status: AC
  Filled 2013-02-10: qty 5

## 2013-02-10 MED ORDER — ONDANSETRON HCL 4 MG/2ML IJ SOLN
INTRAMUSCULAR | Status: AC
  Filled 2013-02-10: qty 2

## 2013-02-10 MED ORDER — CHLOROPROCAINE HCL 1 % IJ SOLN
INTRAMUSCULAR | Status: AC
  Filled 2013-02-10: qty 30

## 2013-02-10 MED ORDER — KETOROLAC TROMETHAMINE 30 MG/ML IJ SOLN: 15.0000 mg | Freq: Once | INTRAMUSCULAR | Status: DC | PRN

## 2013-02-10 MED ORDER — FENTANYL CITRATE 0.05 MG/ML IJ SOLN: 25.0000 ug | INTRAMUSCULAR | Status: DC | PRN

## 2013-02-10 MED ORDER — CHLOROPROCAINE HCL 1 % IJ SOLN
INTRAMUSCULAR | Status: DC | PRN
  Administered 2013-02-10: 10 mL

## 2013-02-10 MED ORDER — PROPOFOL 10 MG/ML IV BOLUS
INTRAVENOUS | Status: DC | PRN
  Administered 2013-02-10 (×7): 20 mg via INTRAVENOUS
  Administered 2013-02-10: 10 mg via INTRAVENOUS
  Administered 2013-02-10 (×3): 20 mg via INTRAVENOUS

## 2013-02-10 MED ORDER — KETOROLAC TROMETHAMINE 30 MG/ML IJ SOLN
INTRAMUSCULAR | Status: AC
  Filled 2013-02-10: qty 1

## 2013-02-10 MED ORDER — MIDAZOLAM HCL 2 MG/2ML IJ SOLN: 0.5000 mg | Freq: Once | INTRAMUSCULAR | Status: DC | PRN

## 2013-02-10 MED ORDER — DEXAMETHASONE SODIUM PHOSPHATE 10 MG/ML IJ SOLN
INTRAMUSCULAR | Status: AC
  Filled 2013-02-10: qty 1

## 2013-02-10 SURGICAL SUPPLY — 20 items
CATH ROBINSON RED A/P 16FR (CATHETERS) ×2 IMPLANT
CLOTH BEACON ORANGE TIMEOUT ST (SAFETY) ×2 IMPLANT
DECANTER SPIKE VIAL GLASS SM (MISCELLANEOUS) ×2 IMPLANT
GLOVE BIO SURGEON STRL SZ 6.5 (GLOVE) ×2 IMPLANT
GLOVE BIOGEL PI IND STRL 7.0 (GLOVE) ×1 IMPLANT
GLOVE BIOGEL PI INDICATOR 7.0 (GLOVE) ×1
GOWN STRL REIN XL XLG (GOWN DISPOSABLE) ×4 IMPLANT
KIT BERKELEY 1ST TRIMESTER 3/8 (MISCELLANEOUS) ×2 IMPLANT
NEEDLE SPNL 22GX3.5 QUINCKE BK (NEEDLE) ×2 IMPLANT
NS IRRIG 1000ML POUR BTL (IV SOLUTION) ×2 IMPLANT
PACK VAGINAL MINOR WOMEN LF (CUSTOM PROCEDURE TRAY) ×2 IMPLANT
PAD OB MATERNITY 4.3X12.25 (PERSONAL CARE ITEMS) ×2 IMPLANT
PAD PREP 24X48 CUFFED NSTRL (MISCELLANEOUS) ×2 IMPLANT
SET BERKELEY SUCTION TUBING (SUCTIONS) ×2 IMPLANT
SYR CONTROL 10ML LL (SYRINGE) ×2 IMPLANT
TOWEL OR 17X24 6PK STRL BLUE (TOWEL DISPOSABLE) ×4 IMPLANT
VACURETTE 10 RIGID CVD (CANNULA) IMPLANT
VACURETTE 7MM CVD STRL WRAP (CANNULA) IMPLANT
VACURETTE 8 RIGID CVD (CANNULA) ×2 IMPLANT
VACURETTE 9 RIGID CVD (CANNULA) IMPLANT

## 2013-02-10 NOTE — Anesthesia Postprocedure Evaluation (Signed)
Anesthesia Post Note  Patient: Candace [name]  Procedure(s) Performed: Procedure(s) (LRB): DILATATION AND EVACUATION (N/A)  Anesthesia type: MAC  Patient location: PACU  Post pain: Pain level controlled  Post assessment: Post-op Vital signs reviewed  Last Vitals:  Filed Vitals:   02/10/13 0607  Pulse: 81  Temp: 36.8 C  Resp: 20    Post vital signs: Reviewed  Level of consciousness: sedated  Complications: No apparent anesthesia complications

## 2013-02-10 NOTE — Op Note (Signed)
Candace [name], Candace [name]                 ACCOUNT NO.:  [id number]  MEDICAL RECORD NO.:  [id number]  LOCATION:  [facility]                          FACILITY:  [facility]  PHYSICIAN:  Zelphia CairoGretchen Dilynn Munroe, MD    DATE OF BIRTH:  [birth year]  DATE OF PROCEDURE:  02/10/2013 DATE OF DISCHARGE:  02/10/2013                              OPERATIVE REPORT   PREOPERATIVE DIAGNOSIS:  Missed abortion.  POSTOPERATIVE DIAGNOSIS:  Missed abortion.  PROCEDURE: 1. Cervical block. 2. Dilation and evacuation with ultrasound guidance.  PHYSICIAN:  Zelphia CairoGretchen Tonna Palazzi, MD  SPECIMEN:  Products of conception.  DESCRIPTION OF PROCEDURE:  The patient was taken to the operating room. After informed consent was obtained, she was given MAC anesthesia, placed in the dorsal lithotomy position using Allen stirrups.  She was prepped and draped in sterile fashion.  Bivalve speculum was placed in the vagina and 1% Nesacaine was used to perform a cervical block. Single-tooth tenaculum was attached to the anterior lip of the cervix and the cervix was serially dilated using Pratt dilators.  An 8-French suction catheter was inserted into the endometrial cavity, however, because she was significantly anteverted, I had difficulty getting to the fundus.  Because of my concern for perforation, I asked ultrasound to come into the room to help aid with visualization.  Abdominal ultrasound was performed during the procedure.  An 8-French suction catheter was used for suction curettage, products of conception, and then a gentle curetting was performed with a ring curette.  Once uterine cry was noted, suction curette was reintroduced one last time to remove any clots and debris.  The patient tolerated the procedure well.  All instruments were removed from the cervix and the uterus.  Cervix was hemostatic.  Speculum was removed.  She was taken to the recovery room in stable condition.  Sponge, lap, needle, and instrument counts were correct  x2.     Zelphia CairoGretchen Marlinda Miranda, MD     GA/MEDQ  D:  02/10/2013  T:  02/10/2013  Job:  [id number]

## 2013-02-10 NOTE — Discharge Instructions (Signed)
[facility] office 2-3 weeks for postop appointment.  Call the office [phone] for an appointment. ° °Personal Hygiene: °Use pads not tampons x 1week °You may shower, no tub baths or pools for 2-3 weeks °Wipe from front to back when using restroom ° °Activity: °Do not drive or operate any equipment for 24 hrs.   °Do not rest in bed all day °Walking is encouraged °Walk up and down stairs slowly °You may return to your normal activity in 1-2 days ° °Sexual Activity:  No intercourse for 2 weeks after the procedure. ° °Diet: Eat a light meal as desired this evening.  You may resume your usual diet tomorrow. ° °Return to work:  You may resume your work activities after 1-2 days ° °What to expect:  Expect to have vaginal bleeding/discharge for 2-3 days and spotting for 10-14 days.  It is not unusual to have soreness for 1-2 weeks.  You may have a slight burning sensation when you urinate for the first few days.  You may start your menses in 2-6 weeks.  Mild cramps may continue for a couple of days.   ° °Call your doctor:   °Excessive bleeding, saturating a pad every hour °Inability to urinate 6 hours after discharge °Pain not relieved with pain medications °Fever of 100.4 or greater °DISCHARGE INSTRUCTIONS: D&C / D&E °The following instructions have been prepared to help you care for yourself upon your return home. °  °Personal hygiene: °• Use sanitary pads for vaginal drainage, not tampons. °• Shower the day after your procedure. °• NO tub baths, pools or Jacuzzis for 2-3 weeks. °• Wipe front to back after using the bathroom. ° °Activity and limitations: °• Do NOT drive or operate any equipment for 24 hours. The effects of anesthesia are still present and drowsiness may result. °• Do NOT rest in bed all day. °• Walking is encouraged. °• Walk up and down stairs slowly. °• You may resume your normal activity in one to two days or as indicated by your physician. ° °Sexual activity: NO intercourse for at least 2 weeks after the  procedure, or as indicated by your physician. ° °Diet: Eat a light meal as desired this evening. You may resume your usual diet tomorrow. ° °Return to work: You may resume your work activities in one to two days or as indicated by your doctor. ° °What to expect after your surgery: Expect to have vaginal bleeding/discharge for 2-3 days and spotting for up to 10 days. It is not unusual to have soreness for up to 1-2 weeks. You may have a slight burning sensation when you urinate for the first day. Mild cramps may continue for a couple of days. You may have a regular period in 2-6 weeks. ° °Call your doctor for any of the following: °• Excessive vaginal bleeding, saturating and changing one pad every hour. °• Inability to urinate 6 hours after discharge from hospital. °• Pain not relieved by pain medication. °• Fever of 100.4° F or greater. °• Unusual vaginal discharge or odor. ° ° Call for an appointment:  ° ° °Patient’s signature: ______________________ ° °Nurse’s signature ________________________ ° °Support person's signature______________________ ° ° ° °

## 2013-02-10 NOTE — Progress Notes (Signed)
Per Dr. Renaldo FiddlerAdkins no abo needed.. Pt is b+

## 2013-02-10 NOTE — Transfer of Care (Signed)
Immediate Anesthesia Transfer of Care Note  Patient: Candace [name]  Procedure(s) Performed: Procedure(s) with comments: DILATATION AND EVACUATION (N/A) - with intra operative us  Patient Location: PACU  Anesthesia Type:MAC  Level of Consciousness: awake, oriented and patient cooperative  Airway & Oxygen Therapy: Patient Spontanous Breathing and Patient connected to nasal cannula oxygen  Post-op Assessment: Report given to PACU RN and Post -op Vital signs reviewed and stable  Post vital signs: stable  Complications: No apparent anesthesia complications

## 2013-02-10 NOTE — Anesthesia Preprocedure Evaluation (Signed)
Anesthesia Evaluation  Patient identified by MRN, date of birth, ID band Patient awake    Reviewed: Allergy & Precautions, H&P , Patient's Chart, lab work & pertinent test results, reviewed documented beta blocker date and time   History of Anesthesia Complications Negative for: history of anesthetic complications  Airway Mallampati: II TM Distance: >3 FB Neck ROM: full    Dental   Pulmonary  breath sounds clear to auscultation        Cardiovascular Exercise Tolerance: Good hypertension, Rhythm:regular Rate:Normal     Neuro/Psych negative psych ROS   GI/Hepatic   Endo/Other    Renal/GU      Musculoskeletal   Abdominal   Peds  Hematology   Anesthesia Other Findings   Reproductive/Obstetrics                           Anesthesia Physical Anesthesia Plan  ASA: II  Anesthesia Plan: MAC   Post-op Pain Management:    Induction:   Airway Management Planned:   Additional Equipment:   Intra-op Plan:   Post-operative Plan:   Informed Consent: I have reviewed the patients History and Physical, chart, labs and discussed the procedure including the risks, benefits and alternatives for the proposed anesthesia with the patient or authorized representative who has indicated his/her understanding and acceptance.   Dental Advisory Given  Plan Discussed with: CRNA, Surgeon and Anesthesiologist  Anesthesia Plan Comments:         Anesthesia Quick Evaluation

## 2013-02-13 ENCOUNTER — Encounter (HOSPITAL_COMMUNITY): Payer: Self-pay | Admitting: Obstetrics and Gynecology

## 2013-03-15 ENCOUNTER — Ambulatory Visit: Payer: 59 | Admitting: Internal Medicine

## 2013-04-12 ENCOUNTER — Ambulatory Visit (INDEPENDENT_AMBULATORY_CARE_PROVIDER_SITE_OTHER): Payer: 59 | Admitting: Internal Medicine

## 2013-04-12 ENCOUNTER — Encounter: Payer: Self-pay | Admitting: Internal Medicine

## 2013-04-12 VITALS — BP 152/96 | HR 86 | Temp 99.0°F | Resp 16 | Wt 191.0 lb

## 2013-04-12 DIAGNOSIS — I10 Essential (primary) hypertension: Secondary | ICD-10-CM

## 2013-04-12 DIAGNOSIS — E042 Nontoxic multinodular goiter: Secondary | ICD-10-CM

## 2013-04-12 MED ORDER — METOPROLOL-HYDROCHLOROTHIAZIDE 50-25 MG PO TABS: 1.0000 | ORAL_TABLET | Freq: Every day | ORAL | Status: DC

## 2013-04-12 MED ORDER — ESZOPICLONE 3 MG PO TABS: ORAL_TABLET | ORAL | Status: DC

## 2013-04-12 NOTE — Patient Instructions (Signed)
See me in 4 weeks  30 min  To have thyroid ultrasound stop by xray today

## 2013-04-12 NOTE — Progress Notes (Signed)
Subjective:    Patient ID: Candace [name], female    DOB: 1980/07/03, 33 y.o.   MRN: [id number]  HPI  Candace [name] is here for follow up.  I have not seen her in quite some time.   She has cancelled her last 4 follow up appts with me.   Since last visit with me she has had a missed abortion S/P D & C Dr. Renaldo FiddlerAdkins  See BP  Pt reports she has not taken her BP pill.    She has rare headache otherwise asymtpomatic.  When asked why she is not taking her medications ,  Pt justs shrugs her shoulders and states  "I don't know"  She did not see  A psychiatrist as recommended.   "I took some time off work"  She still has occasional insomnia.    She has not gone for her follow up thyroid ultrasound for multiple nodules.  This was ordered back in October.    No Known Allergies Past Medical History  Diagnosis Date  . Hypertension     does not take BP med regularly, instructed to take med nest 3 days.  . Anxiety     no meds  . Depression     no meds   Past Surgical History  Procedure Laterality Date  . Dilation and curettage of uterus      x 2 polyps removed  . Spine surgery      patient denies having spine surgery  . Dilation and evacuation N/A 02/10/2013    Procedure: DILATATION AND EVACUATION;  Surgeon: Zelphia CairoGretchen Adkins, MD;  Location: [facility];  Service: Gynecology;  Laterality: N/A;  with intra operative us   History   Social History  . Marital Status: Single    Spouse Name: N/A    Number of Children: N/A  . Years of Education: N/A   Occupational History  . Not on file.   Social History Main Topics  . Smoking status: Never Smoker   . Smokeless tobacco: Never Used  . Alcohol Use: 1.5 oz/week    3 drink(s) per week     Comment: weekends - mixed drinks  . Drug Use: No  . Sexual Activity: Yes    Birth Control/ Protection: None     Comment: [redacted] weeks gestation   Other Topics Concern  . Not on file   Social History Narrative  . No narrative on file   Family History  Problem  Relation Age of Onset  . Diabetes Father   . Hypertension Father   . Hypertension Sister   . Diabetes Maternal Grandmother   . Hypertension Maternal Grandmother    Patient Active Problem List   Diagnosis Date Noted  . Situational depression 11/21/2012  . Edema extremities 04/27/2012  . Multinodular thyroid 12/01/2011  . Situational anxiety 11/26/2011  . Insomnia 11/26/2011  . Urinary urgency 11/26/2011  . Cervical polyp 11/26/2011  . Eczema 11/26/2011  . Multiple thyroid nodules 11/26/2011  . Essential hypertension, benign 11/25/2011   Current Outpatient Prescriptions on File Prior to Visit  Medication Sig Dispense Refill  . metoprolol-hydrochlorothiazide (LOPRESSOR HCT) 50-25 MG per tablet Take 1 tablet by mouth daily.  30 tablet  1  . Prenatal Vit-Fe Fumarate-FA (MULTIVITAMIN-PRENATAL) 27-0.8 MG TABS tablet Take 1 tablet by mouth daily at 12 noon.       No current facility-administered medications on file prior to visit.    a   Review of Systems See HPI    Objective:   Physical  Exam Physical Exam  Nursing note and vitals reviewed.  Constitutional: She is oriented to person, place, and time. She appears well-developed and well-nourished.  HENT:  Head: Normocephalic and atraumatic.  Cardiovascular: Normal rate and regular rhythm. Exam reveals no gallop and no friction rub.  No murmur heard.  Pulmonary/Chest: Breath sounds normal. She has no wheezes. She has no rales.  Neurological: She is alert and oriented to person, place, and time.  Skin: Skin is warm and dry.  Psychiatric: She has a normal mood and affect. Her behavior is normal.         Assessment & Plan:  HTN uncontrolled:  I again stressed the life threatening complications of untreated HTN.  Not sure what is behind her non-adherence.  Advised to take med daily and re-ordered medication.  Clonidine 0.1 mg gibven in office  Thyroid nodule mm size:  Advised pt to go to xray to day to schedule her ultrasound   I re-ordered this test  Insomnia  Ok to take [place] but only 3 times per week  She is to see me in 4 weeks

## 2013-05-10 ENCOUNTER — Ambulatory Visit: Payer: 59 | Admitting: Internal Medicine

## 2013-05-10 DIAGNOSIS — Z09 Encounter for follow-up examination after completed treatment for conditions other than malignant neoplasm: Secondary | ICD-10-CM

## 2013-10-22 ENCOUNTER — Ambulatory Visit (INDEPENDENT_AMBULATORY_CARE_PROVIDER_SITE_OTHER): Payer: 59 | Admitting: Family Medicine

## 2013-10-22 VITALS — BP 135/92 | HR 84 | Temp 98.1°F | Resp 16 | Ht 64.5 in | Wt 195.0 lb

## 2013-10-22 DIAGNOSIS — R21 Rash and other nonspecific skin eruption: Secondary | ICD-10-CM

## 2013-10-22 MED ORDER — DOXYCYCLINE HYCLATE 100 MG PO TABS: 100.0000 mg | ORAL_TABLET | Freq: Two times a day (BID) | ORAL | Status: DC

## 2013-10-22 MED ORDER — FLUCONAZOLE 150 MG PO TABS: 150.0000 mg | ORAL_TABLET | Freq: Once | ORAL | Status: DC

## 2013-10-22 NOTE — Patient Instructions (Signed)
MRSA Infection MRSA stands for methicillin-resistant Staphylococcus aureus. This type of infection is caused by Staphylococcus aureus bacteria that are no longer affected by the medicines used to kill them (drug resistant). Staphylococcus (staph) bacteria are normally found on the skin or in the nose of healthy people. In most cases, these bacteria do not cause infection. But if these resistant bacteria enter your body through a cut or sore, they can cause a serious infection on your skin or in other parts of your body. There is a slight chance that the staph on your skin or in your nose is MRSA. There are two types of MRSA infections:  Hospital-acquired MRSA is bacteria that you get in the hospital.  Community-acquired MRSA is bacteria that you get somewhere other than in a hospital. RISK FACTORS Hospital-acquired MRSA is more common. You could be at risk for this infection if you are in the hospital and you:  Have surgery or a procedure.  Have an IV access or a catheter tube placed in your body.  Have weak resistance to germs (weakened immune system).  Are elderly.  Are on kidney dialysis. You could be at risk for community-acquired MRSA if you have a break in your skin and come into contact with MRSA. This may happen if you:  Play sports where there is skin-to-skin contact.  Live in a crowded setting, like a dormitory or a military barracks.  Share towels, razors, or sports equipment with other people. SYMPTOMS  Symptoms of hospital-acquired MRSA depend on where MRSA has spread. Symptoms may include:  Wound infection.  Skin infection.  Rash.  Pneumonia.  Fever and chills.  Difficulty breathing.  Chest pain. Community-acquired MRSA is most likely to start as a scratch or cut that becomes infected. Symptoms may include:  A pus-filled pimple.  A boil on your skin.  Pus draining from your skin.  A sore (abscess) under your skin or somewhere in your body.  Fever  with or without chills. DIAGNOSIS  The diagnosis of MRSA is made by taking a sample from an infected area and sending it to a lab for testing. A lab technician can grow (culture) MRSA and check it under a microscope. The cultured MRSA can be tested to see which type of antibiotic medicine will work to treat it. Newer tests can identify MRSA more quickly by testing bacteria samples for MRSA genes. Your health care provider can diagnose MRSA using samples from:   Cuts or wounds in infected areas.  Nasal swabs.  Saliva or cough specimens from deep in the lungs (sputum).  Urine.  Blood. You may also have:  Imaging studies (such as X-ray or MRI) to check if the infection has spread to the lungs, bones, or joints.  A culture and sensitivity test of blood or fluids from inside the joints. TREATMENT  Treatment depends on how severe, deep, or extensive the infection is. Very bad infections may require a hospital stay.  Some skin infections, such as a small boil or sore (abscess), may be treated by draining pus from the site of the infection.  More extensive surgery to drain pus may be necessary for deeper or more widespread soft tissue infections.  You may then have to take antibiotic medicine given by mouth or through a vein. You may start antibiotic treatment right away or after testing can be done to see what antibiotic medicine should be used. HOME CARE INSTRUCTIONS   Take your antibiotics as directed by your health care provider. Take   the medicine as prescribed until it is finished.  Avoid close contact with those around you as much as possible. Do not use towels, razors, toothbrushes, bedding, or other items that will be used by others.  Wash your hands frequently for 15 seconds with soap and water. Dry your hands with a clean or disposable towel.  When you are not able to wash your hands, use hand sanitizer that is more than 60 percent alcohol.  Wash towels, sheets, or clothes in  the washing machine with detergent and hot water. Dry them in a hot dryer.  Follow your health care provider's instructions for wound care. Wash your hands before and after changing your bandages.  Always shower after exercising.  Keep all cuts and scrapes clean and covered with a bandage.  Be sure to tell all your health care providers that you have MRSA so they are aware of your infection. SEEK MEDICAL CARE IF:  You have a cut, scrape, pimple, or boil that becomes red, swollen, or painful or has pus in it.  You have pus draining from your skin.  You have an abscess under your skin or somewhere in your body. SEEK IMMEDIATE MEDICAL CARE IF:   You have symptoms of a skin infection with a fever or chills.  You have trouble breathing.  You have chest pain.  You have a skin wound and you become nauseous or start vomiting. MAKE SURE YOU:  Understand these instructions.  Will watch your condition.  Will get help right away if you are not doing well or get worse. Document Released: 01/19/2005 Document Revised: 01/24/2013 Document Reviewed: 11/11/2012 Exit[employer] Patient Information 2015 [employer]. This information is not intended to replace advice given to you by your health care provider. Make sure you discuss any questions you have with your health care provider.  

## 2013-10-22 NOTE — Progress Notes (Signed)
33 year old woman who works in [employer], is accompanied by her boyfriend today, complains of rash on her 4 extremities and her back. She developed this after spending a week at [place]. The area on her back is festering and draining a little bit. The others are more like raised indurated papules. They are itchy. She has used hydrocortisone cream on them.  Objective: No acute distress Examination of the rash reveals crusting papules diffusely. She has a half centimeter open draining furuncle on her right shoulder.  Assessment: Rash Doxy and diflucan in case Signed, Elvina Sidle, MD

## 2013-12-18 ENCOUNTER — Encounter: Payer: Self-pay | Admitting: Internal Medicine

## 2013-12-18 ENCOUNTER — Ambulatory Visit (INDEPENDENT_AMBULATORY_CARE_PROVIDER_SITE_OTHER): Payer: 59 | Admitting: Internal Medicine

## 2013-12-18 VITALS — BP 138/84 | HR 90 | Resp 16 | Ht 64.0 in | Wt 197.0 lb

## 2013-12-18 DIAGNOSIS — I10 Essential (primary) hypertension: Secondary | ICD-10-CM

## 2013-12-18 LAB — LIPID PANEL
Cholesterol: 186 mg/dL (ref 0–200)
HDL: 59 mg/dL (ref >39)
LDL Cholesterol: 117 mg/dL — ABNORMAL HIGH (ref 0–99)
Total CHOL/HDL Ratio: 3.2 Ratio
Triglycerides: 52 mg/dL (ref <150)
VLDL: 10 mg/dL (ref 0–40)

## 2013-12-18 LAB — COMPREHENSIVE METABOLIC PANEL
ALT: 11 U/L (ref 0–35)
AST: 13 U/L (ref 0–37)
Albumin: 4.3 g/dL (ref 3.5–5.2)
Alkaline Phosphatase: 55 U/L (ref 39–117)
BUN: 15 mg/dL (ref 6–23)
CO2: 27 mEq/L (ref 19–32)
Calcium: 9.2 mg/dL (ref 8.4–10.5)
Chloride: 102 mEq/L (ref 96–112)
Creat: 0.73 mg/dL (ref 0.50–1.10)
Glucose, Bld: 95 mg/dL (ref 70–99)
Potassium: 3.8 mEq/L (ref 3.5–5.3)
Sodium: 136 mEq/L (ref 135–145)
Total Bilirubin: 0.4 mg/dL (ref 0.2–1.2)
Total Protein: 7.8 g/dL (ref 6.0–8.3)

## 2013-12-18 LAB — CBC WITH DIFFERENTIAL/PLATELET
Basophils Absolute: 0 10*3/uL (ref 0.0–0.1)
Basophils Relative: 1 % (ref 0–1)
Eosinophils Absolute: 0.5 10*3/uL (ref 0.0–0.7)
Eosinophils Relative: 11 % — ABNORMAL HIGH (ref 0–5)
HCT: 37.8 % (ref 36.0–46.0)
Hemoglobin: 12.9 g/dL (ref 12.0–15.0)
Lymphocytes Relative: 38 % (ref 12–46)
Lymphs Abs: 1.7 10*3/uL (ref 0.7–4.0)
MCH: 29.3 pg (ref 26.0–34.0)
MCHC: 34.1 g/dL (ref 30.0–36.0)
MCV: 85.9 fL (ref 78.0–100.0)
MPV: 10.4 fL (ref 9.4–12.4)
Monocytes Absolute: 0.2 10*3/uL (ref 0.1–1.0)
Monocytes Relative: 5 % (ref 3–12)
Neutro Abs: 2 10*3/uL (ref 1.7–7.7)
Neutrophils Relative %: 45 % (ref 43–77)
Platelets: 216 10*3/uL (ref 150–400)
RBC: 4.4 MIL/uL (ref 3.87–5.11)
RDW: 13.6 % (ref 11.5–15.5)
WBC: 4.5 10*3/uL (ref 4.0–10.5)

## 2013-12-18 LAB — TSH: TSH: 1.95 u[IU]/mL (ref 0.350–4.500)

## 2013-12-18 MED ORDER — METOPROLOL-HYDROCHLOROTHIAZIDE 50-25 MG PO TABS: 1.0000 | ORAL_TABLET | Freq: Every day | ORAL | Status: DC

## 2013-12-18 NOTE — Progress Notes (Signed)
   Subjective:    Patient ID: Candace [name], female    DOB: [birth year], 33 y.o.   MRN: [id number]    HPI Candace [name] is here for follow up.  Still working in [employer]   HTN:  She tells me she ran out of her lopressor/hctz so is taking an old RX of BP med  She did not get her repeat thyroid U/S due to finances     Review of Systems     Objective:   Physical Exam  Physical Exam  Nursing note and vitals reviewed.   Repeat  138/84 Constitutional: She is oriented to person, place, and time. She appears well-developed and well-nourished.  HENT:  Head: Normocephalic and atraumatic.  Cardiovascular: Normal rate and regular rhythm. Exam reveals no gallop and no friction rub.  No murmur heard.  Pulmonary/Chest: Breath sounds normal. She has no wheezes. She has no rales.  Neurological: She is alert and oriented to person, place, and time.  Skin: Skin is warm and dry.  Psychiatric: She has a normal mood and affect. Her behavior is normal.            Assessment & Plan:  HTN  Repeat bp improved advised daily medication  Will refill check all labs today .  Advised birth control to discuss with GYN  She prefers to get her CPE with her GYN   Will check alll labs today   Mm sized thyroid nodules.  Did not get U/S due to expense  Will re-order    She declines CPE as she has one scheduled with Dr. Renaldo FiddlerAdkins.

## 2013-12-19 ENCOUNTER — Other Ambulatory Visit: Payer: Self-pay | Admitting: Internal Medicine

## 2013-12-19 LAB — VITAMIN D 25 HYDROXY (VIT D DEFICIENCY, FRACTURES): Vit D, 25-Hydroxy: 19 ng/mL — ABNORMAL LOW (ref 30–100)

## 2013-12-19 MED ORDER — VITAMIN D (ERGOCALCIFEROL) 1.25 MG (50000 UNIT) PO CAPS: ORAL_CAPSULE | ORAL | Status: DC

## 2013-12-19 NOTE — Progress Notes (Signed)
Patient is aware of her lab results. I mailed a copy to her home.-eh

## 2013-12-20 ENCOUNTER — Ambulatory Visit (HOSPITAL_BASED_OUTPATIENT_CLINIC_OR_DEPARTMENT_OTHER): Admission: RE | Admit: 2013-12-20 | Payer: 59 | Source: Ambulatory Visit

## 2013-12-21 ENCOUNTER — Other Ambulatory Visit: Payer: Self-pay | Admitting: Obstetrics and Gynecology

## 2013-12-25 LAB — CYTOLOGY - PAP

## 2014-01-30 ENCOUNTER — Ambulatory Visit (HOSPITAL_BASED_OUTPATIENT_CLINIC_OR_DEPARTMENT_OTHER)
Admission: RE | Admit: 2014-01-30 | Discharge: 2014-01-30 | Disposition: A | Discharge status: Home / Self Care | Payer: 59 | Source: Ambulatory Visit | Attending: Internal Medicine | Admitting: Internal Medicine

## 2014-01-30 DIAGNOSIS — E042 Nontoxic multinodular goiter: Secondary | ICD-10-CM | POA: Diagnosis present

## 2014-02-05 ENCOUNTER — Telehealth: Payer: Self-pay | Admitting: *Deleted

## 2014-02-05 NOTE — Telephone Encounter (Signed)
I spoke with Candace [name]  And gave her the Thyroid U/S results-eh

## 2014-02-05 NOTE — Telephone Encounter (Signed)
-----   Message from Kendrick Ranch, MD sent at 02/01/2014  9:22 AM EST ----- Call pt and let her know that her thyroid nodules are stable is size and have not increased.

## 2014-03-05 HISTORY — PX: WISDOM TOOTH EXTRACTION: SHX21

## 2014-04-25 ENCOUNTER — Encounter: Payer: 59 | Admitting: Internal Medicine

## 2014-05-16 ENCOUNTER — Other Ambulatory Visit: Payer: Self-pay | Admitting: Internal Medicine

## 2014-05-16 NOTE — Telephone Encounter (Signed)
Refill request

## 2014-05-27 NOTE — Progress Notes (Signed)
Subjective:    Patient ID: Candace [name], female    DOB: [birth year], 34 y.o.   MRN: [id number]  HPI  12/2013 note Assessment & Plan:  HTN Repeat bp improved advised daily medication Will refill check all labs today . Advised birth control to discuss with GYN She prefers to get her CPE with her GYN   Will check alll labs today   Mm sized thyroid nodules. Did not get U/S due to expense Will re-order   She declines CPE as she has one scheduled with Dr. Renaldo Fiddler.        TODAY  Candace [name] is here for CPE  HM  < 1 pack year smoker while in college  Pap done by [facility]  02/2013 had miscarriage  Pt reports unplanned pregnancy  HTN   Reports she remembers to take her medication most weekdays and forgets on weekends  MNG  Tiny bilateral thyroid nodules that have benign radiologic appearance.  On recent U/S   Pt reports easy distractibility  Wondering about adult ADD      No Known Allergies Past Medical History  Diagnosis Date  . Hypertension     does not take BP med regularly, instructed to take med nest 3 days.  . Anxiety     no meds  . Depression     no meds   Past Surgical History  Procedure Laterality Date  . Dilation and curettage of uterus      x 2 polyps removed  . Spine surgery      patient denies having spine surgery  . Dilation and evacuation N/A 02/10/2013    Procedure: DILATATION AND EVACUATION;  Surgeon: Zelphia Cairo, MD;  Location: [facility];  Service: Gynecology;  Laterality: N/A;  with intra operative [place]   History   Social History  . Marital Status: Single    Spouse Name: N/A  . Number of Children: N/A  . Years of Education: N/A   Occupational History  . Not on file.   Social History Main Topics  . Smoking status: Never Smoker   . Smokeless tobacco: Never Used  . Alcohol Use: 1.5 oz/week    3 drink(s) per week     Comment: weekends - mixed drinks  . Drug Use: No  . Sexual Activity: Yes    Birth Control/ Protection: None     Comment: [redacted]  weeks gestation   Other Topics Concern  . Not on file   Social History Narrative   Family History  Problem Relation Age of Onset  . Diabetes Father   . Hypertension Father   . Hypertension Sister   . Diabetes Maternal Grandmother   . Hypertension Maternal Grandmother    Patient Active Problem List   Diagnosis Date Noted  . Situational depression 11/21/2012  . Edema extremities 04/27/2012  . Multinodular thyroid 12/01/2011  . Situational anxiety 11/26/2011  . Insomnia 11/26/2011  . Urinary urgency 11/26/2011  . Cervical polyp 11/26/2011  . Eczema 11/26/2011  . Multiple thyroid nodules 11/26/2011  . Essential hypertension, benign 11/25/2011   Current Outpatient Prescriptions on File Prior to Visit  Medication Sig Dispense Refill  . buPROPion (WELLBUTRIN) 75 MG tablet TAKE 1 TABLET BY MOUTH EVERY MORNING FOR 7 DAYS, THEN TAKE 2 TABLETS EVERY MORNING. 180 tablet 1  . metoprolol-hydrochlorothiazide (LOPRESSOR HCT) 50-25 MG per tablet Take 1 tablet by mouth daily. 90 tablet 2  . Omega-3 Fatty Acids (FISH OIL) 1000 MG CAPS Take by mouth.    . Prenatal  Vit-Fe Fumarate-FA (MULTIVITAMIN-PRENATAL) 27-0.8 MG TABS tablet Take 1 tablet by mouth daily at 12 noon.    . Vitamin D, Ergocalciferol, (DRISDOL) 50000 UNITS CAPS capsule Take one capsule once a week for 12 weeks then 1000 units daily 12 capsule 0   No current facility-administered medications on file prior to visit.      Review of Systems  Respiratory: Negative for cough, choking, chest tightness, shortness of breath and wheezing.   Cardiovascular: Negative for chest pain, palpitations and leg swelling.       Objective:   Physical Exam  Physical Exam  Nursing note and vitals reviewed.  Constitutional: She is oriented to person, place, and time. She appears well-developed and well-nourished.  HENT:  Head: Normocephalic and atraumatic.  Right Ear: Tympanic membrane and ear canal normal. No drainage. Tympanic membrane is  not injected and not erythematous.  Left Ear: Tympanic membrane and ear canal normal. No drainage. Tympanic membrane is not injected and not erythematous.  Nose: Nose normal. Right sinus exhibits no maxillary sinus tenderness and no frontal sinus tenderness. Left sinus exhibits no maxillary sinus tenderness and no frontal sinus tenderness.  Mouth/Throat: Oropharynx is clear and moist. No oral lesions. No oropharyngeal exudate.  Eyes: Conjunctivae and EOM are normal. Pupils are equal, round, and reactive to light.  Neck: Normal range of motion. Neck supple. No JVD present. Carotid bruit is not present. No mass and no thyromegaly present.  Cardiovascular: Normal rate, regular rhythm, S1 normal, S2 normal and intact distal pulses. Exam reveals no gallop and no friction rub.  No murmur heard.  Pulses:  Carotid pulses are 2+ on the right side, and 2+ on the left side.  Dorsalis pedis pulses are 2+ on the right side, and 2+ on the left side.  No carotid bruit. No LE edema  Pulmonary/Chest: Breath sounds normal. She has no wheezes. She has no rales. She exhibits no tenderness.   Breast no discrete mass no nipple discharge no axillary adenopathy bilaterally Abdominal: Soft. Bowel sounds are normal. She exhibits no distension and no mass. There is no hepatosplenomegaly. There is no tenderness. There is no CVA tenderness.  Musculoskeletal: Normal range of motion.  No active synovitis to joints.  Lymphadenopathy:  She has no cervical adenopathy.  She has no axillary adenopathy.  Right: No inguinal and no supraclavicular adenopathy present.  Left: No inguinal and no supraclavicular adenopathy present.  Neurological: She is alert and oriented to person, place, and time. She has normal strength and normal reflexes. She displays no tremor. No cranial nerve deficit or sensory deficit. Coordination and gait normal.  Skin: Skin is warm and dry. No rash noted. No cyanosis. Nails show no clubbing.  Psychiatric:  She has a normal mood and affect. Her speech is normal and behavior is normal. Cognition and memory are normal.         Assessment & Plan:  HM advised 3D mm age 34  < 1 pack yer smoker  Pap done GYN office  HTN advised to be sure to take meds daily  Bilateral thyroid nodules.  Minimal risk for Thyroid CA  No FH,  No irradiation  Ok to repeat in 2-3 years   Vitamin D defic  Completed high dose  Advised 1000 units daily  Recheck today   Disractibility  Given number to Dr. Matheny SinkBraden psychiatry for eval   Pt has received letter of my departure and advised to make appt with PCP of choice

## 2014-05-28 ENCOUNTER — Ambulatory Visit (INDEPENDENT_AMBULATORY_CARE_PROVIDER_SITE_OTHER): Payer: 59 | Admitting: Internal Medicine

## 2014-05-28 ENCOUNTER — Encounter: Payer: Self-pay | Admitting: Internal Medicine

## 2014-05-28 VITALS — BP 136/88 | HR 82 | Resp 16 | Ht 64.0 in | Wt 193.0 lb

## 2014-05-28 DIAGNOSIS — E559 Vitamin D deficiency, unspecified: Secondary | ICD-10-CM | POA: Diagnosis not present

## 2014-05-28 DIAGNOSIS — Z0000 Encounter for general adult medical examination without abnormal findings: Secondary | ICD-10-CM

## 2014-05-28 DIAGNOSIS — I10 Essential (primary) hypertension: Secondary | ICD-10-CM

## 2014-05-28 DIAGNOSIS — E042 Nontoxic multinodular goiter: Secondary | ICD-10-CM

## 2014-05-28 LAB — POCT URINALYSIS DIPSTICK
Bilirubin, UA: NEGATIVE
Blood, UA: NEGATIVE
Glucose, UA: NEGATIVE
Ketones, UA: NEGATIVE
Leukocytes, UA: NEGATIVE
Nitrite, UA: NEGATIVE
Protein, UA: NEGATIVE
Spec Grav, UA: 1.01
Urobilinogen, UA: NEGATIVE
pH, UA: 6.5

## 2014-05-29 ENCOUNTER — Encounter: Payer: Self-pay | Admitting: *Deleted

## 2014-06-11 DIAGNOSIS — L659 Nonscarring hair loss, unspecified: Secondary | ICD-10-CM | POA: Insufficient documentation

## 2014-08-03 ENCOUNTER — Ambulatory Visit: Payer: 59 | Admitting: Family Medicine

## 2014-08-12 ENCOUNTER — Ambulatory Visit (INDEPENDENT_AMBULATORY_CARE_PROVIDER_SITE_OTHER): Payer: 59 | Admitting: Family Medicine

## 2014-08-12 VITALS — BP 118/82 | HR 97 | Temp 98.3°F | Resp 16 | Ht 64.5 in | Wt 190.0 lb

## 2014-08-12 DIAGNOSIS — R109 Unspecified abdominal pain: Secondary | ICD-10-CM | POA: Diagnosis not present

## 2014-08-12 DIAGNOSIS — R350 Frequency of micturition: Secondary | ICD-10-CM | POA: Diagnosis not present

## 2014-08-12 DIAGNOSIS — N390 Urinary tract infection, site not specified: Secondary | ICD-10-CM | POA: Diagnosis not present

## 2014-08-12 DIAGNOSIS — R8281 Pyuria: Secondary | ICD-10-CM

## 2014-08-12 LAB — CBC WITH DIFFERENTIAL/PLATELET
Basophils Absolute: 0.1 10*3/uL (ref 0.0–0.1)
Basophils Relative: 1 % (ref 0–1)
Eosinophils Absolute: 0.4 10*3/uL (ref 0.0–0.7)
Eosinophils Relative: 7 % — ABNORMAL HIGH (ref 0–5)
HCT: 38.5 % (ref 36.0–46.0)
Hemoglobin: 13.3 g/dL (ref 12.0–15.0)
Lymphocytes Relative: 37 % (ref 12–46)
Lymphs Abs: 1.9 10*3/uL (ref 0.7–4.0)
MCH: 29.6 pg (ref 26.0–34.0)
MCHC: 34.5 g/dL (ref 30.0–36.0)
MCV: 85.7 fL (ref 78.0–100.0)
MPV: 10.6 fL (ref 8.6–12.4)
Monocytes Absolute: 0.5 10*3/uL (ref 0.1–1.0)
Monocytes Relative: 9 % (ref 3–12)
Neutro Abs: 2.4 10*3/uL (ref 1.7–7.7)
Neutrophils Relative %: 46 % (ref 43–77)
Platelets: 219 10*3/uL (ref 150–400)
RBC: 4.49 MIL/uL (ref 3.87–5.11)
RDW: 13.3 % (ref 11.5–15.5)
WBC: 5.2 10*3/uL (ref 4.0–10.5)

## 2014-08-12 LAB — COMPLETE METABOLIC PANEL WITH GFR
ALT: 9 U/L (ref 0–35)
AST: 13 U/L (ref 0–37)
Albumin: 4.3 g/dL (ref 3.5–5.2)
Alkaline Phosphatase: 52 U/L (ref 39–117)
BUN: 15 mg/dL (ref 6–23)
CO2: 29 mEq/L (ref 19–32)
Calcium: 9.1 mg/dL (ref 8.4–10.5)
Chloride: 97 mEq/L (ref 96–112)
Creat: 0.85 mg/dL (ref 0.50–1.10)
GFR, Est African American: >89 mL/min
GFR, Est Non African American: >89 mL/min
Glucose, Bld: 97 mg/dL (ref 70–99)
Potassium: 3.1 mEq/L — ABNORMAL LOW (ref 3.5–5.3)
Sodium: 133 mEq/L — ABNORMAL LOW (ref 135–145)
Total Bilirubin: 0.7 mg/dL (ref 0.2–1.2)
Total Protein: 7.2 g/dL (ref 6.0–8.3)

## 2014-08-12 LAB — POCT UA - MICROSCOPIC ONLY
Casts, Ur, LPF, POC: NEGATIVE
Crystals, Ur, HPF, POC: NEGATIVE
Mucus, UA: NEGATIVE
Yeast, UA: NEGATIVE

## 2014-08-12 LAB — POCT URINALYSIS DIPSTICK
Bilirubin, UA: NEGATIVE
Glucose, UA: NEGATIVE
Ketones, UA: NEGATIVE
Nitrite, UA: POSITIVE
Protein, UA: >=300
Spec Grav, UA: 1.02
Urobilinogen, UA: 1
pH, UA: 6

## 2014-08-12 MED ORDER — HYDROCODONE-ACETAMINOPHEN 5-325 MG PO TABS: 1.0000 | ORAL_TABLET | Freq: Four times a day (QID) | ORAL | Status: DC | PRN

## 2014-08-12 MED ORDER — CIPROFLOXACIN HCL 500 MG PO TABS: 500.0000 mg | ORAL_TABLET | Freq: Two times a day (BID) | ORAL | Status: DC

## 2014-08-12 NOTE — Patient Instructions (Signed)
I suspect you have a kidney stone and a urinary infection. You have evidence for both.  I'm expecting that he'll feel much better in 24 hours. I will you start the antibiotic medicine right away and return if pain persists or worsens over the next 48 hours.  Any vomiting, high fever or uncontrollable pain or signs that you needed to go to the emergency room.

## 2014-08-12 NOTE — Progress Notes (Signed)
Subjective:    Patient ID: Candace [name], female    DOB: February 21, 1980, 34 y.o.   MRN: [id number] This chart was scribed for Candace Sidle, MD by Littie Deeds, Medical Scribe. This patient was seen in Room 12 and the patient's care was started at 8:37 AM.   HPI HPI Comments: Candace [name] is a 34 y.o. female who presents to the [facility] complaining of sudden onset left flank pain that started yesterday afternoon around 3:00 PM. She has had some difficulty sleeping last night due to the pain. The pain is worsened with ambulating, which she notes caused the pain to become a shooting pain. She did take some hydrocodone leftover from a prior surgery with relief to the pain. Patient reports having associated nausea and urinary urgency that started yesterday as well as urinary frequency that started about 2 weeks ago. She has taken OTC Azo for her symptoms. Patient denies hematuria and vomiting. She reports history of hypertension.  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  Review of Systems  Gastrointestinal: Positive for nausea. Negative for vomiting.  Genitourinary: Positive for urgency, frequency and flank pain. Negative for hematuria.       Objective:   Physical Exam CONSTITUTIONAL: Well developed/well nourished HEAD: Normocephalic/atraumatic EYES: EOM/PERRL ENMT: Mucous membranes moist NECK: supple no meningeal signs SPINE: entire spine nontender CV: S1/S2 noted, no murmurs/rubs/gallops noted LUNGS: Lungs are clear to auscultation bilaterally, no apparent distress Abdomen: Patient has mild tenderness in the left lower quadrant as well as left CVA area. There are no masses, no guarding, no rebound, no HSM NEURO: Pt is awake/alert, moves all extremitiesx4 EXTREMITIES: pulses normal, full ROM SKIN: warm, color normal PSYCH: no abnormalities of mood noted  Results for orders placed or performed in visit on  08/12/14  POCT UA - Microscopic Only  Result Value Ref Range   WBC, Ur, HPF, POC TNTC    RBC, urine, microscopic TNTC    Bacteria, U Microscopic 2+    Mucus, UA neg    Epithelial cells, urine per micros 2-4    Crystals, Ur, HPF, POC neg    Casts, Ur, LPF, POC neg    Yeast, UA neg   POCT urinalysis dipstick  Result Value Ref Range   Color, UA orange    Clarity, UA sl cloudy    Glucose, UA neg    Bilirubin, UA neg    Ketones, UA neg    Spec Grav, UA 1.020    Blood, UA large    pH, UA 6.0    Protein, UA >=300    Urobilinogen, UA 1.0    Nitrite, UA positive    Leukocytes, UA large (3+) (A) Negative       Assessment & Plan:   This chart was scribed in my presence and reviewed by me personally.    ICD-9-CM ICD-10-CM   1. Urination frequency 788.41 R35.0 POCT UA - Microscopic Only     POCT urinalysis dipstick     Urine culture     CBC with Differential/Platelet     COMPLETE METABOLIC PANEL WITH GFR     ciprofloxacin (CIPRO) 500 MG tablet  2. Left flank pain 789.09 R10.9 POCT UA - Microscopic Only     POCT urinalysis dipstick     Urine culture     CBC with Differential/Platelet     COMPLETE METABOLIC PANEL WITH GFR     HYDROcodone-acetaminophen (NORCO) 5-325  MG per tablet  3. Pyuria 791.9 N39.0 ciprofloxacin (CIPRO) 500 MG tablet   I suspect patient does have a stone given the blood in your urine. She's been instructed to push fluids and return if pain worsens or does not resolve in 2 days.  Signed, Candace SidleKurt Lauenstein, MD

## 2014-08-14 ENCOUNTER — Other Ambulatory Visit: Payer: Self-pay | Admitting: Family Medicine

## 2014-08-14 LAB — URINE CULTURE: Colony Count: >=100000

## 2014-08-14 MED ORDER — POTASSIUM CHLORIDE CRYS ER 20 MEQ PO TBCR: 20.0000 meq | EXTENDED_RELEASE_TABLET | Freq: Two times a day (BID) | ORAL | Status: DC

## 2014-12-25 ENCOUNTER — Ambulatory Visit (INDEPENDENT_AMBULATORY_CARE_PROVIDER_SITE_OTHER): Payer: 59 | Admitting: Family Medicine

## 2014-12-25 VITALS — BP 130/90 | HR 88 | Temp 99.0°F | Resp 17 | Ht 64.0 in | Wt 197.0 lb

## 2014-12-25 DIAGNOSIS — L089 Local infection of the skin and subcutaneous tissue, unspecified: Secondary | ICD-10-CM | POA: Diagnosis not present

## 2014-12-25 DIAGNOSIS — B372 Candidiasis of skin and nail: Secondary | ICD-10-CM | POA: Diagnosis not present

## 2014-12-25 DIAGNOSIS — I10 Essential (primary) hypertension: Secondary | ICD-10-CM | POA: Diagnosis not present

## 2014-12-25 DIAGNOSIS — R21 Rash and other nonspecific skin eruption: Secondary | ICD-10-CM

## 2014-12-25 DIAGNOSIS — L309 Dermatitis, unspecified: Secondary | ICD-10-CM

## 2014-12-25 DIAGNOSIS — T148XXA Other injury of unspecified body region, initial encounter: Secondary | ICD-10-CM

## 2014-12-25 MED ORDER — TRIAMCINOLONE ACETONIDE 0.1 % EX CREA
1.0000 | TOPICAL_CREAM | Freq: Two times a day (BID) | CUTANEOUS | Status: DC | PRN
Start: 2014-12-25 — End: 2015-12-30

## 2014-12-25 MED ORDER — NYSTATIN 100000 UNIT/GM EX POWD: Freq: Two times a day (BID) | CUTANEOUS | Status: DC | PRN

## 2014-12-25 MED ORDER — MUPIROCIN 2 % EX OINT: TOPICAL_OINTMENT | CUTANEOUS | Status: DC

## 2014-12-25 NOTE — Progress Notes (Signed)
Chief Complaint:  Chief Complaint  Patient presents with  . armpit rash    HPI: Candace [name] is a 34 y.o. female who reports to [facility] today complaining of rash bilaterally under armpits.  She has had this for several days and has tried shaving and also using under arm deodorant and it hurts . She has tried using her triamcinolone cream which she has for eczema and it lso burn. She denies DM She has been scratching it. No fevers or chills but there has been some clear dc . No pus, no LAD.  + HTN , she has been on metoprolol and HCTZ. She doe snot take the medicine regular. She works in [employer] and depending where she is at she ahs to urinate and she does not have control over it. She did take her medicine today. She does not want to try anything new, no norvasc due to swelling. She was on what sounds like an ACEI but was taken off due to concerns of childbearing age but she is not palnnign tohave a baby, currently does not want to change.    Past Medical History  Diagnosis Date  . Hypertension     does not take BP med regularly, instructed to take med nest 3 days.  . Anxiety     no meds  . Depression     no meds   Past Surgical History  Procedure Laterality Date  . Dilation and curettage of uterus      x 2 polyps removed  . Spine surgery      patient denies having spine surgery  . Dilation and evacuation N/A 02/10/2013    Procedure: DILATATION AND EVACUATION;  Surgeon: Marylynn Pearson, MD;  Location: [facility];  Service: Gynecology;  Laterality: N/A;  with intra operative [place]  . Wisdom tooth extraction  03/2014   Social History   Social History  . Marital Status: Single    Spouse Name: N/A  . Number of Children: N/A  . Years of Education: N/A   Social History Main Topics  . Smoking status: Never Smoker   . Smokeless tobacco: Never Used  . Alcohol Use: 1.5 oz/week    3 drink(s) per week     Comment: weekends - mixed drinks  . Drug Use: No  . Sexual Activity: Yes    Birth  Control/ Protection: None     Comment: [redacted] weeks gestation   Other Topics Concern  . None   Social History Narrative   Family History  Problem Relation Age of Onset  . Diabetes Father   . Hypertension Father   . Hypertension Sister   . Diabetes Maternal Grandmother   . Hypertension Maternal Grandmother    No Known Allergies Prior to Admission medications   Medication Sig Start Date End Date Taking? Authorizing Provider  metoprolol-hydrochlorothiazide (LOPRESSOR HCT) 50-25 MG per tablet Take 1 tablet by mouth daily. 12/18/13  Yes Lanice Shirts, MD  Probiotic Product (PROBIOTIC DAILY PO) Take by mouth.   Yes Historical Provider, MD  HYDROcodone-acetaminophen (NORCO) 5-325 MG per tablet Take 1 tablet by mouth every 6 (six) hours as needed for moderate pain. Patient not taking: Reported on 12/25/2014 08/12/14   Robyn Haber, MD  Omega-3 Fatty Acids (FISH OIL) 1000 MG CAPS Take by mouth.    Historical Provider, MD     ROS: The patient denies fevers, chills, night sweats, unintentional weight loss, chest pain, palpitations, wheezing, dyspnea on exertion, nausea, vomiting, abdominal  pain, dysuria, hematuria, melena, numbness, weakness, or tingling.   All other systems have been reviewed and were otherwise negative with the exception of those mentioned in the HPI and as above.    PHYSICAL EXAM: Filed Vitals:   12/25/14 2011  BP: 130/90  Pulse: 88  Temp: 99 F (37.2 C)  Resp: 17   Body mass index is 33.8 kg/(m^2).   General: Alert, no acute distress, overweight HEENT:  Normocephalic, atraumatic, oropharynx patent. EOMI, PERRLA Cardiovascular:  Regular rate and rhythm, no rubs murmurs or gallops.  No Carotid bruits, radial pulse intact. No pedal edema.  Respiratory: Clear to auscultation bilaterally.  No wheezes, rales, or rhonchi.  No cyanosis, no use of accessory musculature Abdominal: No organomegaly, abdomen is soft and non-tender, positive bowel sounds. No  masses. Skin: + skin tear along axilla, no e/o cellulitis or abscess, she has some minmal candida along breast /bra line, + eczema  Neurologic: Facial musculature symmetric. Psychiatric: Patient acts appropriately throughout our interaction. Lymphatic: No cervical or submandibular lymphadenopathy Musculoskeletal: Gait intact. No edema, tenderness   LABS: Results for orders placed or performed in visit on 08/12/14  Urine culture  Result Value Ref Range   Culture ESCHERICHIA COLI    Colony Count >=100,000 COLONIES/ML    Organism ID, Bacteria ESCHERICHIA COLI       Susceptibility   Escherichia coli -  (no method available)    AMPICILLIN <=2 Sensitive     AMOX/CLAVULANIC <=2 Sensitive     AMPICILLIN/SULBACTAM <=2 Sensitive     PIP/TAZO <=4 Sensitive     IMIPENEM <=0.25 Sensitive     CEFAZOLIN <=4 Sensitive     CEFTRIAXONE <=1 Sensitive     CEFTAZIDIME <=1 Sensitive     CEFEPIME <=1 Sensitive     GENTAMICIN <=1 Sensitive     TOBRAMYCIN <=1 Sensitive     CIPROFLOXACIN <=0.25 Sensitive     LEVOFLOXACIN <=0.12 Sensitive     NITROFURANTOIN 32 Sensitive     TRIMETH/SULFA <=20 Sensitive   CBC with Differential/Platelet  Result Value Ref Range   WBC 5.2 4.0 - 10.5 K/uL   RBC 4.49 3.87 - 5.11 MIL/uL   Hemoglobin 13.3 12.0 - 15.0 g/dL   HCT 38.5 36.0 - 46.0 %   MCV 85.7 78.0 - 100.0 fL   MCH 29.6 26.0 - 34.0 pg   MCHC 34.5 30.0 - 36.0 g/dL   RDW 13.3 11.5 - 15.5 %   Platelets 219 150 - 400 K/uL   MPV 10.6 8.6 - 12.4 fL   Neutrophils Relative % 46 43 - 77 %   Neutro Abs 2.4 1.7 - 7.7 K/uL   Lymphocytes Relative 37 12 - 46 %   Lymphs Abs 1.9 0.7 - 4.0 K/uL   Monocytes Relative 9 3 - 12 %   Monocytes Absolute 0.5 0.1 - 1.0 K/uL   Eosinophils Relative 7 (H) 0 - 5 %   Eosinophils Absolute 0.4 0.0 - 0.7 K/uL   Basophils Relative 1 0 - 1 %   Basophils Absolute 0.1 0.0 - 0.1 K/uL   Smear Review Criteria for review not met   COMPLETE METABOLIC PANEL WITH GFR  Result Value Ref Range    Sodium 133 (L) 135 - 145 mEq/L   Potassium 3.1 (L) 3.5 - 5.3 mEq/L   Chloride 97 96 - 112 mEq/L   CO2 29 19 - 32 mEq/L   Glucose, Bld 97 70 - 99 mg/dL   BUN 15 6 - 23 mg/dL  Creat 0.85 0.50 - 1.10 mg/dL   Total Bilirubin 0.7 0.2 - 1.2 mg/dL   Alkaline Phosphatase 52 39 - 117 U/L   AST 13 0 - 37 U/L   ALT 9 0 - 35 U/L   Total Protein 7.2 6.0 - 8.3 g/dL   Albumin 4.3 3.5 - 5.2 g/dL   Calcium 9.1 8.4 - 10.5 mg/dL   GFR, Est African American >89 mL/min   GFR, Est Non African American >89 mL/min  POCT UA - Microscopic Only  Result Value Ref Range   WBC, Ur, HPF, POC TNTC    RBC, urine, microscopic TNTC    Bacteria, U Microscopic 2+    Mucus, UA neg    Epithelial cells, urine per micros 2-4    Crystals, Ur, HPF, POC neg    Casts, Ur, LPF, POC neg    Yeast, UA neg   POCT urinalysis dipstick  Result Value Ref Range   Color, UA orange    Clarity, UA sl cloudy    Glucose, UA neg    Bilirubin, UA neg    Ketones, UA neg    Spec Grav, UA 1.020    Blood, UA large    pH, UA 6.0    Protein, UA >=300    Urobilinogen, UA 1.0    Nitrite, UA positive    Leukocytes, UA large (3+) (A) Negative     EKG/XRAY:   Primary read interpreted by Dr. Marin Comment at [facility].   ASSESSMENT/PLAN: Encounter Diagnoses  Name Primary?  . Rash Yes  . Essential hypertension   . Eczema   . Infected skin tear   . Candidal intertrigo     Advise to monitor BP and pulse, consider changing meds to ACEI/ARB and chlorthalidon eif patient interested, she is not at this time. Does not want to try it at thsi time. She is noncompliant with meds due to diuretics Rx Nystatin powder for breast Rx bactroban since localized skin tears in axilla Rx Triamcinolone cream for eczema Fu prn    Gross sideeffects, risk and benefits, and alternatives of medications d/w patient. Patient is aware that all medications have potential sideeffects and we are unable to predict every sideeffect or drug-drug interaction that may  occur.  Thao Le DO  12/26/2014 9:13 AM

## 2015-02-20 ENCOUNTER — Telehealth: Payer: Self-pay | Admitting: Family Medicine

## 2015-02-20 NOTE — Telephone Encounter (Signed)
VM from pt 1/17 5:00pm to est as new pt with our office, left msg for her to call us back to schedule

## 2015-02-22 DIAGNOSIS — H10413 Chronic giant papillary conjunctivitis, bilateral: Secondary | ICD-10-CM | POA: Diagnosis not present

## 2015-02-22 DIAGNOSIS — H179 Unspecified corneal scar and opacity: Secondary | ICD-10-CM | POA: Diagnosis not present

## 2015-02-22 DIAGNOSIS — H1013 Acute atopic conjunctivitis, bilateral: Secondary | ICD-10-CM | POA: Diagnosis not present

## 2015-02-22 MED FILL — LOTEMAX 0.5% EYE DROPS: 0.5 | 12 days supply | Qty: 5 | Fill #0

## 2015-03-15 DIAGNOSIS — H10413 Chronic giant papillary conjunctivitis, bilateral: Secondary | ICD-10-CM | POA: Diagnosis not present

## 2015-03-15 DIAGNOSIS — H04123 Dry eye syndrome of bilateral lacrimal glands: Secondary | ICD-10-CM | POA: Diagnosis not present

## 2015-03-15 DIAGNOSIS — H1013 Acute atopic conjunctivitis, bilateral: Secondary | ICD-10-CM | POA: Diagnosis not present

## 2015-03-15 MED FILL — LOTEMAX 0.5% EYE DROPS: 0.5 | 12 days supply | Qty: 5 | Fill #0

## 2015-04-26 MED FILL — LIDOCAINE 5% OINTMENT: 5 | 10 days supply | Qty: 30 | Fill #0

## 2015-04-26 MED FILL — LOTEMAX 0.5% EYE DROPS: 0.5 | 12 days supply | Qty: 5 | Fill #0

## 2015-05-03 DIAGNOSIS — H10413 Chronic giant papillary conjunctivitis, bilateral: Secondary | ICD-10-CM | POA: Diagnosis not present

## 2015-05-17 MED FILL — ESZOPICLONE 3 MG TABLET: 3 | 30 days supply | Qty: 30 | Fill #0

## 2015-05-17 MED FILL — DEXTROAMP-AMPHET ER 10 MG C: 10 | 30 days supply | Qty: 60 | Fill #0

## 2015-05-17 MED FILL — DUREZOL 0.05% EYE DROPS: 0.05 | 16 days supply | Qty: 5 | Fill #0

## 2015-05-20 DIAGNOSIS — H10413 Chronic giant papillary conjunctivitis, bilateral: Secondary | ICD-10-CM | POA: Diagnosis not present

## 2015-06-21 MED FILL — ESZOPICLONE 3 MG TABLET: 3 | 30 days supply | Qty: 30 | Fill #1

## 2015-06-21 MED FILL — TRIAMCINOLONE 0.1% CREAM: 0.1 | 14 days supply | Qty: 30 | Fill #1

## 2015-06-21 MED FILL — metroNIDAZOLE 0.75 % GEL: 0.75 | 5 days supply | Qty: 70 | Fill #1

## 2015-07-15 ENCOUNTER — Ambulatory Visit (INDEPENDENT_AMBULATORY_CARE_PROVIDER_SITE_OTHER): Payer: 59 | Admitting: Physician Assistant

## 2015-07-15 VITALS — BP 134/90 | HR 97 | Temp 98.3°F | Resp 18 | Ht 64.0 in | Wt 189.4 lb

## 2015-07-15 DIAGNOSIS — R350 Frequency of micturition: Secondary | ICD-10-CM | POA: Diagnosis not present

## 2015-07-15 DIAGNOSIS — I10 Essential (primary) hypertension: Secondary | ICD-10-CM

## 2015-07-15 DIAGNOSIS — R109 Unspecified abdominal pain: Secondary | ICD-10-CM | POA: Diagnosis not present

## 2015-07-15 DIAGNOSIS — B373 Candidiasis of vulva and vagina: Secondary | ICD-10-CM

## 2015-07-15 DIAGNOSIS — R1030 Lower abdominal pain, unspecified: Secondary | ICD-10-CM | POA: Diagnosis not present

## 2015-07-15 DIAGNOSIS — B3731 Acute candidiasis of vulva and vagina: Secondary | ICD-10-CM

## 2015-07-15 DIAGNOSIS — R3915 Urgency of urination: Secondary | ICD-10-CM | POA: Diagnosis not present

## 2015-07-15 LAB — POCT WET + KOH PREP
Trich by wet prep: ABSENT
Yeast by wet prep: ABSENT

## 2015-07-15 LAB — POCT URINALYSIS DIP (MANUAL ENTRY)
Bilirubin, UA: NEGATIVE
Blood, UA: NEGATIVE
Glucose, UA: NEGATIVE
Leukocytes, UA: NEGATIVE
Nitrite, UA: NEGATIVE
Protein Ur, POC: NEGATIVE
Spec Grav, UA: 1.02
Urobilinogen, UA: 0.2
pH, UA: 6

## 2015-07-15 LAB — POCT CBC
Granulocyte percent: 53.3 %G (ref 37–80)
HCT, POC: 36.2 % — AB (ref 37.7–47.9)
Hemoglobin: 12.4 g/dL (ref 12.2–16.2)
Lymph, poc: 2.3 (ref 0.6–3.4)
MCH, POC: 29.2 pg (ref 27–31.2)
MCHC: 34.2 g/dL (ref 31.8–35.4)
MCV: 85.3 fL (ref 80–97)
MID (cbc): 0.4 (ref 0–0.9)
MPV: 8.6 fL (ref 0–99.8)
POC Granulocyte: 3 (ref 2–6.9)
POC LYMPH PERCENT: 39.9 %L (ref 10–50)
POC MID %: 6.8 %M (ref 0–12)
Platelet Count, POC: 179 10*3/uL (ref 142–424)
RBC: 4.24 M/uL (ref 4.04–5.48)
RDW, POC: 12.6 %
WBC: 5.7 10*3/uL (ref 4.6–10.2)

## 2015-07-15 LAB — POC MICROSCOPIC URINALYSIS (UMFC): Mucus: ABSENT

## 2015-07-15 LAB — POCT URINE PREGNANCY: Preg Test, Ur: NEGATIVE

## 2015-07-15 MED ORDER — FLUCONAZOLE 150 MG PO TABS: 150.0000 mg | ORAL_TABLET | Freq: Once | ORAL | Status: DC

## 2015-07-15 MED ORDER — LISINOPRIL 5 MG PO TABS: 5.0000 mg | ORAL_TABLET | Freq: Every day | ORAL | Status: DC

## 2015-07-15 NOTE — Patient Instructions (Addendum)
If pain becomes constant, worsens or fever or vomiting develop go to the ED. If pain continues after treatment return to this office and we will order pelvic US.    IF you received an x-ray today, you will receive an invoice from [facility]. Please contact [facility] at [phone] with questions or concerns regarding your invoice.   IF you received labwork today, you will receive an invoice from [facility]. Please contact [facility] at [phone] with questions or concerns regarding your invoice.   Our billing staff will not be able to assist you with questions regarding bills from these companies.  You will be contacted with the lab results as soon as they are available. The fastest way to get your results is to activate your My Chart account. Instructions are located on the last page of this paperwork. If you have not heard from us regarding the results in 2 weeks, please contact this office.

## 2015-07-15 NOTE — Progress Notes (Signed)
Subjective:    Patient ID: Candace [name], female    DOB: [birth year], 35 y.o.   MRN: [id number]  HPI  Patient is a 35 yo female who presents today with complaints of RLQ pain.  She describes a 8/10 sharp pain, which started  2 nights ago and was constant throughout the night until the next morning, it has been intermittent since that time, occuring 3-4x hour.  Admits it is worse with eating, but she was still able to walk 3 mi today.  Denies fever, nausea, vomiting, diarrhea, constipation, headache, chest pain, heartburn, anorexia, dysuria, hematuria, and discharge. Lat BM yesterday.  Currently sexual active with one female partner, does not use contraception, does not believe she is pregnant or could have an STI.    Admits to UTI 3 weeks ago, "I could feel my bladder contracting", was prescribed Macrobid x5days, only took 3days worth b/c she felt improved.  History chronic bacterial vaginosis, associated with menses, prescribed Flaygl by GYN, 1 dose to be taken when symptoms arise.  History of urinary urgency, nocturia 3-4x nightly for years, GYN prescirbed something for it, can not remember what, she notes no improvement so stopped taking it.  Stopped taking hypertension medication because it caused urinary incontinence, "As soon as it hit me that I had to go , I would go."  History of ovarian cyst found on [place], no treatment necessary,; history of cervical polys, surgically removed; history of 2 first trimester miscarriages, requiring D&C.  LMP:06/20/15 - History of irregular cycle.    Review of Systems All others negative except those listed in HPI.  Patient Active Problem List   Diagnosis Date Noted  . Alopecia 06/11/2014  . Situational depression 11/21/2012  . Edema extremities 04/27/2012  . Multinodular thyroid 12/01/2011  . Situational anxiety 11/26/2011  . Insomnia 11/26/2011  . Urinary urgency 11/26/2011  . Cervical polyp 11/26/2011  . Eczema 11/26/2011  . Multiple thyroid  nodules 11/26/2011  . Essential hypertension, benign 11/25/2011   Current Outpatient Prescriptions on File Prior to Visit  Medication Sig Dispense Refill  . mupirocin ointment (BACTROBAN) 2 % aplly to affected area BID prn 30 g 1  . nystatin (MYCOSTATIN) powder Apply topically 2 (two) times daily as needed. 30 g 0  . triamcinolone cream (KENALOG) 0.1 % Apply 1 application topically 2 (two) times daily as needed. Not more than 2 weeks at a time 30 g 5   No current facility-administered medications on file prior to visit.   No Known Allergies     Objective:   Physical Exam  Constitutional: She is oriented to person, place, and time. She appears well-developed and well-nourished.  Cardiovascular: Normal rate, regular rhythm, normal heart sounds and intact distal pulses.   Pulmonary/Chest: Effort normal and breath sounds normal.  Abdominal: Soft. Bowel sounds are normal. She exhibits no distension and no mass. There is no hepatosplenomegaly. There is tenderness in the right lower quadrant and suprapubic area. There is rebound and tenderness at McBurney's point. There is no rigidity, no guarding and no CVA tenderness.  Negative Rovsing sign, negative Obturator sign,   Genitourinary: Uterus normal. Uterus is not tender. Cervix exhibits no motion tenderness, no discharge and no friability. Right adnexum displays no tenderness. Left adnexum displays no tenderness. No tenderness in the vagina. No signs of injury around the vagina. Vaginal discharge found.  Neurological: She is alert and oriented to person, place, and time.  Skin: Skin is warm and dry.  Psychiatric: She has a  normal mood and affect. Her behavior is normal. Judgment and thought content normal.   Microscopic wet-mount exam, KOH done, yeast present.      Assessment & Plan: BP 134/90 mmHg  Pulse 97  Temp(Src) 98.3 F (36.8 C) (Oral)  Resp 18  Ht 5\' 4"  (1.626 m)  Wt 189 lb 6.4 oz (85.911 kg)  BMI 32.49 kg/m2  SpO2 98%  LMP  06/20/2015  1. Lower abdominal pain - POCT urinalysis dipstick - POCT Microscopic Urinalysis ([facility]) - Comprehensive metabolic panel - POCT urine pregnancy - POCT CBC - POCT Wet + KOH Prep CBC and urinalysis results within normal limits. No signs of infection. Pregnancy test negative, and yeast found on wet prep. Patient treated for yeast infection. She as instructed to go to emergency room if pain becomes constant, worsens, or fever or vomiting develop.  If intermittent pain continues following treatment, will order pelvic US for further evaluation.  2. Urination frequency 3. Urinary urgency 4. Vaginal yeast infection - fluconazole (DIFLUCAN) 150 MG tablet; Take 1 tablet (150 mg total) by mouth once. Repeat if needed  Dispense: 2 tablet; Refill: 0 Yeast present on US and likely causing her urinary symptoms.  Patient instructed to take 1 fluconazole today and if symptoms persist in a week take the other dose.  Call the office if symptoms persist.  5. Essential hypertension, benign Discussed hypertension management and incontience casued by chronic urgency and diuretic use.   Patient prescribed lisinopril 5mg  daily for hypertension. Instructed to follow up in 3-4 months for further management.  Contact office if cough develops or if patient has any questions or concerns. - lisinopril (PRINIVIL,ZESTRIL) 5 MG tablet; Take 1 tablet (5 mg total) by mouth daily.  Dispense: 90 tablet; Refill: 3  Amry Cathy P. Sayan Aldava, PA-S

## 2015-07-15 NOTE — Progress Notes (Signed)
Patient ID: Candace [name], female    DOB: Mar 12, 1980, 35 y.o.   MRN: [id number]  PCP: Levon Hedger, MD  Subjective:   Chief Complaint  Patient presents with  . Abdominal Pain    shooting pain in lower abdomen since Saturday  . Medication Refill    metoprolol    HPI Presents for evaluation of RLQ abdominal pain x 2 days.  Describes it as sharp, 8/10 pain. Intermittent, with episodes every 3-4 hours. Worse with eating. Not worse with exertion-able to walk 3 miles today.  No fever, chills, nausea, vomiting, diarrhea. Last BM was yesterday and was normal for her. No heartburn, indigestion. No urinary changes. No vaginal itching or discharge.  Had a UTI several weeks ago, treated with nitrofurantoin prescribed by a coworker. She only took 3 of the 5 day course because she was feeling so much better. Long history of recurrent BV, for which she takes metronidazole x 1 dose after each menstrual bleed. Several year history of nocturia, for which she was prescribed a medication that she does not recall, but that didn't work.  She has a history of HTN, for which she was prescribed an HCTZ-containing product, but she stopped it due to urinary incontinence.  Previous ovarian cyst.  Sexually active with one monogamous female partner. No contraception. She has a history of miscarriage x 2.    Review of Systems As above.    Patient Active Problem List   Diagnosis Date Noted  . Alopecia 06/11/2014  . Situational depression 11/21/2012  . Edema extremities 04/27/2012  . Multinodular thyroid 12/01/2011  . Situational anxiety 11/26/2011  . Insomnia 11/26/2011  . Urinary urgency 11/26/2011  . Cervical polyp 11/26/2011  . Eczema 11/26/2011  . Multiple thyroid nodules 11/26/2011  . Essential hypertension, benign 11/25/2011     Prior to Admission medications   Medication Sig Start Date End Date Taking? Authorizing Provider  DUREZOL 0.05 % EMUL  05/17/15  Yes Historical  Provider, MD  lidocaine (XYLOCAINE) 5 % ointment  04/26/15  Yes Historical Provider, MD  mupirocin ointment (BACTROBAN) 2 % aplly to affected area BID prn 12/25/14  Yes Thao P Le, DO  nystatin (MYCOSTATIN) powder Apply topically 2 (two) times daily as needed. 12/25/14  Yes Thao P Le, DO  triamcinolone cream (KENALOG) 0.1 % Apply 1 application topically 2 (two) times daily as needed. Not more than 2 weeks at a time 12/25/14  Yes Thao P Le, DO  metoprolol-hydrochlorothiazide (LOPRESSOR HCT) 50-25 MG per tablet Take 1 tablet by mouth daily. Patient not taking: Reported on 07/15/2015 12/18/13   Kendrick Ranch, MD  metroNIDAZOLE (METROGEL) 0.75 % vaginal gel  06/21/15   Historical Provider, MD     No Known Allergies     Objective:  Physical Exam  Constitutional: She is oriented to person, place, and time. She appears well-developed and well-nourished. She is active and cooperative. No distress.  BP 134/90 mmHg  Pulse 97  Temp(Src) 98.3 F (36.8 C) (Oral)  Resp 18  Ht 5\' 4"  (1.626 m)  Wt 189 lb 6.4 oz (85.911 kg)  BMI 32.49 kg/m2  SpO2 98%  LMP 06/20/2015  HENT:  Head: Normocephalic and atraumatic.  Right Ear: Hearing normal.  Left Ear: Hearing normal.  Eyes: Conjunctivae are normal. No scleral icterus.  Neck: Normal range of motion. Neck supple. No thyromegaly present.  Cardiovascular: Normal rate, regular rhythm and normal heart sounds.   Pulses:      Radial pulses are 2+ on  the right side, and 2+ on the left side.  Pulmonary/Chest: Effort normal and breath sounds normal.  Abdominal: Normal appearance and bowel sounds are normal. She exhibits no distension and no mass. There is no hepatosplenomegaly. There is tenderness in the right lower quadrant and suprapubic area. There is no rigidity, no guarding, no CVA tenderness, no tenderness at McBurney's point and negative Murphy's sign. Rebound: RLQ.  Lymphadenopathy:       Head (right side): No tonsillar, no preauricular, no  posterior auricular and no occipital adenopathy present.       Head (left side): No tonsillar, no preauricular, no posterior auricular and no occipital adenopathy present.    She has no cervical adenopathy.       Right: No supraclavicular adenopathy present.       Left: No supraclavicular adenopathy present.  Neurological: She is alert and oriented to person, place, and time. No sensory deficit.  Skin: Skin is warm, dry and intact. No rash noted. No cyanosis or erythema. Nails show no clubbing.  Psychiatric: She has a normal mood and affect. Her speech is normal and behavior is normal.     Results for orders placed or performed in visit on 07/15/15  POCT urinalysis dipstick  Result Value Ref Range   Color, UA yellow yellow   Clarity, UA clear clear   Glucose, UA negative negative   Bilirubin, UA negative negative   Ketones, POC UA trace (5) (A) negative   Spec Grav, UA 1.020    Blood, UA negative negative   pH, UA 6.0    Protein Ur, POC negative negative   Urobilinogen, UA 0.2    Nitrite, UA Negative Negative   Leukocytes, UA Negative Negative  POCT Microscopic Urinalysis ([facility])  Result Value Ref Range   WBC,UR,HPF,POC None None WBC/hpf   RBC,UR,HPF,POC None None RBC/hpf   Bacteria None None, Too numerous to count   Mucus Absent Absent   Epithelial Cells, UR Per Microscopy Few (A) None, Too numerous to count cells/hpf  POCT urine pregnancy  Result Value Ref Range   Preg Test, Ur Negative Negative  POCT CBC  Result Value Ref Range   WBC 5.7 4.6 - 10.2 K/uL   Lymph, poc 2.3 0.6 - 3.4   POC LYMPH PERCENT 39.9 10 - 50 %L   MID (cbc) 0.4 0 - 0.9   POC MID % 6.8 0 - 12 %M   POC Granulocyte 3.0 2 - 6.9   Granulocyte percent 53.3 37 - 80 %G   RBC 4.24 4.04 - 5.48 M/uL   Hemoglobin 12.4 12.2 - 16.2 g/dL   HCT, POC 16.1 (A) 09.6 - 47.9 %   MCV 85.3 80 - 97 fL   MCH, POC 29.2 27 - 31.2 pg   MCHC 34.2 31.8 - 35.4 g/dL   RDW, POC 04.5 %   Platelet Count, POC 179 142 - 424 K/uL     MPV 8.6 0 - 99.8 fL  POCT Wet + KOH Prep  Result Value Ref Range   Yeast by KOH Present Present, Absent   Yeast by wet prep Absent Present, Absent   WBC by wet prep None None, Few, Too numerous to count   Clue Cells Wet Prep HPF POC Few (A) None, Too numerous to count   Trich by wet prep Absent Present, Absent   Bacteria Wet Prep HPF POC None None, Few, Too numerous to count   Epithelial Cells By [employer] Pref ([facility]) Many (A) None, Few, Too numerous  to count   RBC,UR,HPF,POC None None RBC/hpf         Assessment & Plan:   1. Lower abdominal pain No cause for this symptom. Possibly ovulatory pain. Normal CBC and no peritoneal signs on exam. Doubt surgical abdomen. Possibly recurrent ovarian cyst. If pain worsens, RTC or go to the ED. If persists, plan US. - POCT urinalysis dipstick - POCT Microscopic Urinalysis ([facility]) - Comprehensive metabolic panel - POCT urine pregnancy - POCT CBC - POCT Wet + KOH Prep  2. Urination frequency 3. Urinary urgency 5. Vaginal yeast infection The urinary symptoms are chronic. Doubt this is the cause of her pain. Treat vaginal yeast. - fluconazole (DIFLUCAN) 150 MG tablet; Take 1 tablet (150 mg total) by mouth once. Repeat if needed  Dispense: 2 tablet; Refill: 0   4. Essential hypertension, benign Uncontrolled. Avoid diuretic due to chronic nocturia and history of urgency and incontinence with diuretic previously. Trial of lisinopril. RTC 4-6 weeks to recheck benefit and BMET. - lisinopril (PRINIVIL,ZESTRIL) 5 MG tablet; Take 1 tablet (5 mg total) by mouth daily.  Dispense: 90 tablet; Refill: 3    Fernande Brashelle S. Jonas Goh, PA-C Physician Assistant-Certified [facility]

## 2015-07-16 LAB — COMPREHENSIVE METABOLIC PANEL
ALT: 9 U/L (ref 6–29)
AST: 13 U/L (ref 10–30)
Albumin: 4.2 g/dL (ref 3.6–5.1)
Alkaline Phosphatase: 60 U/L (ref 33–115)
BUN: 19 mg/dL (ref 7–25)
CO2: 27 mmol/L (ref 20–31)
Calcium: 9 mg/dL (ref 8.6–10.2)
Chloride: 101 mmol/L (ref 98–110)
Creat: 0.75 mg/dL (ref 0.50–1.10)
Glucose, Bld: 86 mg/dL (ref 65–99)
Potassium: 3.1 mmol/L — ABNORMAL LOW (ref 3.5–5.3)
Sodium: 139 mmol/L (ref 135–146)
Total Bilirubin: 0.4 mg/dL (ref 0.2–1.2)
Total Protein: 7 g/dL (ref 6.1–8.1)

## 2015-07-17 DIAGNOSIS — L700 Acne vulgaris: Secondary | ICD-10-CM | POA: Diagnosis not present

## 2015-07-17 DIAGNOSIS — L218 Other seborrheic dermatitis: Secondary | ICD-10-CM | POA: Diagnosis not present

## 2015-07-29 DIAGNOSIS — H40013 Open angle with borderline findings, low risk, bilateral: Secondary | ICD-10-CM | POA: Diagnosis not present

## 2015-07-29 DIAGNOSIS — H04123 Dry eye syndrome of bilateral lacrimal glands: Secondary | ICD-10-CM | POA: Diagnosis not present

## 2015-07-29 DIAGNOSIS — H10413 Chronic giant papillary conjunctivitis, bilateral: Secondary | ICD-10-CM | POA: Diagnosis not present

## 2015-08-12 ENCOUNTER — Encounter: Payer: Self-pay | Admitting: Internal Medicine

## 2015-08-12 ENCOUNTER — Other Ambulatory Visit (INDEPENDENT_AMBULATORY_CARE_PROVIDER_SITE_OTHER): Payer: 59

## 2015-08-12 ENCOUNTER — Ambulatory Visit (INDEPENDENT_AMBULATORY_CARE_PROVIDER_SITE_OTHER): Payer: 59 | Admitting: Internal Medicine

## 2015-08-12 VITALS — BP 156/116 | HR 89 | Temp 98.6°F | Resp 16 | Ht 64.0 in | Wt 185.0 lb

## 2015-08-12 DIAGNOSIS — E559 Vitamin D deficiency, unspecified: Secondary | ICD-10-CM | POA: Diagnosis not present

## 2015-08-12 DIAGNOSIS — E042 Nontoxic multinodular goiter: Secondary | ICD-10-CM

## 2015-08-12 DIAGNOSIS — F418 Other specified anxiety disorders: Secondary | ICD-10-CM

## 2015-08-12 DIAGNOSIS — I10 Essential (primary) hypertension: Secondary | ICD-10-CM

## 2015-08-12 LAB — COMPREHENSIVE METABOLIC PANEL
ALT: 9 U/L (ref 0–35)
AST: 13 U/L (ref 0–37)
Albumin: 4.3 g/dL (ref 3.5–5.2)
Alkaline Phosphatase: 53 U/L (ref 39–117)
BUN: 11 mg/dL (ref 6–23)
CO2: 26 mEq/L (ref 19–32)
Calcium: 9.4 mg/dL (ref 8.4–10.5)
Chloride: 102 mEq/L (ref 96–112)
Creatinine, Ser: 0.69 mg/dL (ref 0.40–1.20)
GFR: 124.67 mL/min (ref >60.00)
Glucose, Bld: 92 mg/dL (ref 70–99)
Potassium: 3.6 mEq/L (ref 3.5–5.1)
Sodium: 136 mEq/L (ref 135–145)
Total Bilirubin: 0.7 mg/dL (ref 0.2–1.2)
Total Protein: 7.5 g/dL (ref 6.0–8.3)

## 2015-08-12 LAB — TSH: TSH: 1.26 u[IU]/mL (ref 0.35–4.50)

## 2015-08-12 MED ORDER — NIFEDIPINE ER OSMOTIC RELEASE 60 MG PO TB24: 60.0000 mg | ORAL_TABLET | Freq: Every day | ORAL | Status: DC

## 2015-08-12 MED FILL — NIFEdipine ER 60 MG TB24: 60 | 30 days supply | Qty: 30 | Fill #0

## 2015-08-12 NOTE — Progress Notes (Signed)
Subjective:    Patient ID: Candace [name], female    DOB: [birth year], 35 y.o.   MRN: [id number]  HPI She is here to establish with a new pcp.    Hypertension: She is not taking her medication daily - she ran out of her medication. She is compliant with a low sodium diet.  She denies chest pain, palpitations, edema, shortness of breath and regular headaches. She is exercising regularly. She has lost weight.  She does monitor her blood pressure at home - 130-160/100-130.Marland Kitchen.    Anxiety, difficulty sleeping at times;  She takes xanax as needed - maybe 2-3 times a week.     Medications and allergies reviewed with patient and updated if appropriate.  Patient Active Problem List   Diagnosis Date Noted  . Alopecia 06/11/2014  . Situational depression 11/21/2012  . Edema extremities 04/27/2012  . Multinodular thyroid 12/01/2011  . Situational anxiety 11/26/2011  . Insomnia 11/26/2011  . Urinary urgency 11/26/2011  . Cervical polyp 11/26/2011  . Eczema 11/26/2011  . Multiple thyroid nodules 11/26/2011  . Essential hypertension, benign 11/25/2011    Current Outpatient Prescriptions on File Prior to Visit  Medication Sig Dispense Refill  . DUREZOL 0.05 % EMUL   1  . lidocaine (XYLOCAINE) 5 % ointment   0  . lisinopril (PRINIVIL,ZESTRIL) 5 MG tablet Take 1 tablet (5 mg total) by mouth daily. 90 tablet 3  . metroNIDAZOLE (METROGEL) 0.75 % vaginal gel   1  . mupirocin ointment (BACTROBAN) 2 % aplly to affected area BID prn 30 g 1  . nystatin (MYCOSTATIN) powder Apply topically 2 (two) times daily as needed. 30 g 0  . triamcinolone cream (KENALOG) 0.1 % Apply 1 application topically 2 (two) times daily as needed. Not more than 2 weeks at a time 30 g 5   No current facility-administered medications on file prior to visit.    Past Medical History  Diagnosis Date  . Hypertension     does not take BP med regularly, instructed to take med nest 3 days.  . Anxiety     no meds  . Depression      no meds    Past Surgical History  Procedure Laterality Date  . Dilation and curettage of uterus      x 2 polyps removed  . Spine surgery      patient denies having spine surgery  . Dilation and evacuation N/A 02/10/2013    Procedure: DILATATION AND EVACUATION;  Surgeon: Zelphia CairoGretchen Adkins, MD;  Location: [facility];  Service: Gynecology;  Laterality: N/A;  with intra operative us  . Wisdom tooth extraction  03/2014    Social History   Social History  . Marital Status: Single    Spouse Name: n/a  . Number of Children: 0  . Years of Education: N/A   Occupational History  . assessment counselor     [employer]   Social History Main Topics  . Smoking status: Never Smoker   . Smokeless tobacco: Never Used  . Alcohol Use: 1.8 oz/week    3 Standard drinks or equivalent per week     Comment: weekends - mixed drinks  . Drug Use: No  . Sexual Activity:    Partners: Male    Birth Control/ Protection: None   Other Topics Concern  . None   Social History Narrative    Family History  Problem Relation Age of Onset  . Diabetes Father   . Hypertension Father   .  Hypertension Sister   . Diabetes Maternal Grandmother   . Hypertension Maternal Grandmother     Review of Systems  Constitutional: Negative for fever.  Respiratory: Negative for cough, shortness of breath and wheezing.   Cardiovascular: Negative for chest pain, palpitations and leg swelling.  Neurological: Positive for headaches. Negative for dizziness and light-headedness.       Objective:   Filed Vitals:   08/12/15 1307  BP: 156/116  Pulse: 89  Temp: 98.6 F (37 C)  Resp: 16   Filed Weights   08/12/15 1307  Weight: 185 lb (83.915 kg)   Body mass index is 31.74 kg/(m^2).   Physical Exam Constitutional: Appears well-developed and well-nourished. No distress.  Neck: Neck supple. No tracheal deviation present.  thyromegaly present.  No carotid bruit. No cervical adenopathy.   Cardiovascular: Normal  rate, regular rhythm and normal heart sounds.   No murmur heard.  No edema Pulmonary/Chest: Effort normal and breath sounds normal. No respiratory distress. No wheezes.  Psych: normal mood and affect       Assessment & Plan:   See Problem List for Assessment and Plan of chronic medical problems.  F/u in 1-2 months for a physical

## 2015-08-12 NOTE — Assessment & Plan Note (Signed)
Taking xanax 2-3 times a week

## 2015-08-12 NOTE — Patient Instructions (Addendum)
Monitor your BP at home - goal BP is < 140/90.  Start taking a multivitamin and vitamin D 1000 units daily   Test(s) ordered today. Your results will be released to MyChart (or called to you) after review, usually within 72hours after test completion. If any changes need to be made, you will be notified at that same time.  Medications reviewed and updated.  Changes include starting nifedipine 60 mg daily  Your prescription(s) have been submitted to your pharmacy. Please take as directed and contact our office if you believe you are having problem(s) with the medication(s).   Please followup in 1-2 months

## 2015-08-12 NOTE — Progress Notes (Signed)
Pre visit review using our clinic review tool, if applicable. No additional management support is needed unless otherwise documented below in the visit note. 

## 2015-08-16 MED FILL — HYDROCORTISONE 2.5% CREAM: 2.5 | 15 days supply | Qty: 30 | Fill #0

## 2015-08-16 MED FILL — ALPRAZolam 0.25 MG TABS: 0.25 | 30 days supply | Qty: 90 | Fill #0

## 2015-08-29 ENCOUNTER — Telehealth: Payer: Self-pay | Admitting: Internal Medicine

## 2015-08-29 NOTE — Telephone Encounter (Signed)
Patient Name: Candace [name]  DOB: May 09, 1980    Initial Comment Needs to discuss her bp meds   Nurse Assessment  Nurse: Nicanor Bake, RN, Marylene Land Date/Time Lamount Cohen Time): 08/29/2015 5:20:22 PM  Confirm and document reason for call. If symptomatic, describe symptoms. You must click the next button to save text entered. ---Caller was changed from Lisinopril to Norvasc she believes. She states she has the same issues of increased urination. The doctor said this medication would not cause it. She continues to have the increased urination. BP is 183/104 - she has not taken her BP medication. She states she cant be at work and still urinate all the time so she did not take any more. She has not taken any BP medication in a week and a half.  Has the patient traveled out of the country within the last 30 days? ---No  Does the patient have any new or worsening symptoms? ---Yes  Will a triage be completed? ---Yes  Related visit to physician within the last 2 weeks? ---Yes  Does the PT have any chronic conditions? (i.e. diabetes, asthma, etc.) ---Yes  List chronic conditions. ---HTN  Is the patient pregnant or possibly pregnant? (Ask all females between the ages of 65-55) ---No  Is this a behavioral health or substance abuse call? ---No     Guidelines    Guideline Title Affirmed Question Affirmed Notes  High Blood Pressure BP ? 180/110    Final Disposition User   See Physician within 24 Hours [place], RN, Marylene Land    Comments  Attempted to make appt- none available with Dr. Cheryll Cockayne. Pt needs a call back re: BP medication causing increased urination. She did not take it for a week and half and now BP 183/104. No other symptoms. She will also call office tomorrow and when she gets off work she will take her BP medication.   Referrals  REFERRED TO PCP OFFICE   Disagree/Comply: Comply

## 2015-08-30 ENCOUNTER — Telehealth: Payer: Self-pay | Admitting: Internal Medicine

## 2015-08-30 NOTE — Telephone Encounter (Signed)
Patient Name: Candace [name]  DOB: [birth year]    Initial Comment Caller states bp meds are making her have urine incontinence   Nurse Assessment  Nurse: Sherilyn Cooter, RN, Thurmond Butts Date/Time Lamount Cohen Time): 08/30/2015 1:08:14 PM  Confirm and document reason for call. If symptomatic, describe symptoms. You must click the next button to save text entered. ---Caller states that she thinks her BP med is making her have urinary incontinence. She is taking Nifedipine ER 60mg  1 PO daily. this is not listed as a side effect on [employer]'s site. She is getting up to use the bathroom at night though. Sometimes as much as 5-6 times and has had "accidents" in her bed.  Has the patient traveled out of the country within the last 30 days? ---No  Does the patient have any new or worsening symptoms? ---Yes  Will a triage be completed? ---Yes  Related visit to physician within the last 2 weeks? ---Yes  Does the PT have any chronic conditions? (i.e. diabetes, asthma, etc.) ---Yes  List chronic conditions. ---HTN  Is the patient pregnant or possibly pregnant? (Ask all females between the ages of 55-55) ---No  Is this a behavioral health or substance abuse call? ---No     Guidelines    Guideline Title Affirmed Question Affirmed Notes  Urinary Symptoms [1] Can't control passage of urine (i.e., urinary incontinence) AND [2] new onset (< 2 weeks) or worsening    Final Disposition User   See Physician within 24 Hours Sherilyn Cooter, RN, Thurmond Butts    Comments  No appointments available at [facility]. Call would prefer to be seen at [facility]. Advised her that they will be open tomorrow, but there are not any appointments showing as of yet. She states she will call them herself to see if they can see her tomorrow. If not, she will call back for an appointment at [facility]. [facility] does have several appointments available today, but none for Shirline Frees FNP.   Referrals  REFERRED TO PCP OFFICE   Disagree/Comply: Comply

## 2015-09-23 ENCOUNTER — Encounter: Payer: Self-pay | Admitting: Sports Medicine

## 2015-09-23 ENCOUNTER — Ambulatory Visit (INDEPENDENT_AMBULATORY_CARE_PROVIDER_SITE_OTHER): Payer: 59 | Admitting: Sports Medicine

## 2015-09-23 ENCOUNTER — Ambulatory Visit (INDEPENDENT_AMBULATORY_CARE_PROVIDER_SITE_OTHER): Payer: 59

## 2015-09-23 DIAGNOSIS — M79671 Pain in right foot: Secondary | ICD-10-CM

## 2015-09-23 DIAGNOSIS — M545 Low back pain, unspecified: Secondary | ICD-10-CM | POA: Insufficient documentation

## 2015-09-23 DIAGNOSIS — M79672 Pain in left foot: Secondary | ICD-10-CM | POA: Diagnosis not present

## 2015-09-23 MED ORDER — MELOXICAM 15 MG PO TABS: ORAL_TABLET | ORAL | 3 refills | Status: DC

## 2015-09-23 MED FILL — ERYTHROMYCIN EYE OINTMENT: 5 | 7 days supply | Qty: 4 | Fill #0

## 2015-09-23 MED FILL — TRIAMCINOLONE 0.1% CREAM: 0.1 | 14 days supply | Qty: 30 | Fill #2

## 2015-09-23 NOTE — Assessment & Plan Note (Signed)
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 X-rays, meloxicam, formal physical therapy. Return in one month for this.

## 2015-09-23 NOTE — Progress Notes (Signed)
Subjective:    I'm seeing this patient as a consultation for:  Dr. Cheryll CockayneStacy Burns  CC: bilateral foot and L back pain   HPI: 35 yo presenting with bilateral foot and L low back pain.  Patient says she has had bilateral medial foot pain for the past 6 months, associated with exercise.  She says she gets a sharp, achy pain during the first 15 minutes that she exercises at the gym, and after that time her feet "go numb" and don't bother her anymore.  Pain does not radiate down to her toes or up to her ankles.  Pain resolves with rest.  She has not tried any pain medication, icing, or heat for pain.   She also complains of three weeks of L low back pain that radiates to her L upper buttocks.  She says pain is sharp, achy, and usually constant, although pain is less severe than normal today.  She says pain is worse when sitting and it takes her awhile to find a comfortable sitting position.  She says standing makes it feel better but if she stands for too long she needs to sit because of the pain.  She is having trouble sleeping because of the pain.  She has not tried any pain medication, icing, or heat for her back pain.   Past medical history, Surgical history, Family history not pertinant except as noted below, Social history, Allergies, and medications have been entered into the medical record, reviewed, and no changes needed.   Review of Systems: No headache, visual changes, nausea, vomiting, diarrhea, constipation, dizziness, abdominal pain, skin rash, fevers, chills, night sweats, weight loss, swollen lymph nodes, body aches, joint swelling, muscle aches, chest pain, shortness of breath, mood changes, visual or auditory hallucinations.   Objective:   General: Well Developed, well nourished, and in no acute distress.  Neuro/Psych: Alert and oriented x3, extra-ocular muscles intact, able to move all 4 extremities, sensation grossly intact. Skin: Warm and dry, no rashes noted.  Respiratory: Not  using accessory muscles, speaking in full sentences, trachea midline.  Cardiovascular: Pulses palpable, no extremity edema. Abdomen: Does not appear distended. Back Exam:  Inspection: Unremarkable  Motion: Flexion 45 deg, Extension 45 deg, Side Bending to 45 deg bilaterally,  Rotation to 45 deg bilaterally  SLR laying: Negative  XSLR laying: Negative  Palpable tenderness: None. FABER: negative. Sensory change: Gross sensation intact to all lumbar and sacral dermatomes.  Reflexes: 2+ at both patellar tendons, 2+ at achilles tendons, Babinski's downgoing.  Strength at foot  Plantar-flexion: 5/5 Dorsi-flexion: 5/5 Eversion: 5/5 Inversion: 5/5  Leg strength  Quad: 5/5 Hamstring: 5/5 Hip flexor: 5/5 Hip abductors: 5/5  Gait unremarkable.  R, L Foot: No visible erythema or swelling. Range of motion is full in all directions. Strength is 5/5 in all directions. No hallux valgus. No pes cavus or pes planus. No abnormal callus noted. No pain over the navicular prominence, or base of fifth metatarsal. No tenderness to palpation of the calcaneal insertion of plantar fascia. TTP over plantar tarso-metatarsal joint bilaterally. No pain at the Achilles insertion. No pain over the calcaneal bursa. No pain of the retrocalcaneal bursa. No tenderness to palpation over the tarsals, metatarsals, or phalanges. No hallux rigidus or limitus. No tenderness palpation over interphalangeal joints. No pain with compression of the metatarsal heads. Neurovascularly intact distally.    Impression and Recommendations:   This case required medical decision making of moderate complexity.  1. Bilateral foot pain - likely synovitis -  Meloxicam daily  -XRay bilateral feet -Return for orthotics appt   2. L back pain - likely degenerative disc disease  -Meloxicam daily  -XRay L spine  -Begin formal PT program -Return in one month for follow-up

## 2015-09-23 NOTE — Assessment & Plan Note (Signed)
Pain at the plantar tarsometatarsal joint. Meloxicam, bilateral x-rays, return for custom orthotics.

## 2015-09-30 ENCOUNTER — Encounter: Payer: 59 | Admitting: Sports Medicine

## 2015-10-27 NOTE — Progress Notes (Signed)
Subjective:    Patient ID: Candace [name], female    DOB: [birth year], 35 y.o.   MRN: [id number]  HPI No show     Medications and allergies reviewed with patient and updated if appropriate.  Patient Active Problem List   Diagnosis Date Noted  . Bilateral foot pain 09/23/2015  . Low back pain 09/23/2015  . Vitamin D deficiency 08/12/2015  . Alopecia 06/11/2014  . Situational depression 11/21/2012  . Edema extremities 04/27/2012  . Multinodular thyroid 12/01/2011  . Situational anxiety 11/26/2011  . Insomnia 11/26/2011  . Urinary urgency 11/26/2011  . Cervical polyp 11/26/2011  . Eczema 11/26/2011  . Essential hypertension, benign 11/25/2011    Current Outpatient Prescriptions on File Prior to Visit  Medication Sig Dispense Refill  . ALPRAZolam (XANAX) 1 MG tablet Take 1 mg by mouth at bedtime as needed for anxiety.    . DUREZOL 0.05 % EMUL   1  . lidocaine (XYLOCAINE) 5 % ointment   0  . meloxicam (MOBIC) 15 MG tablet One tab PO qAM with breakfast for 2 weeks, then daily prn pain. 30 tablet 3  . metroNIDAZOLE (METROGEL) 0.75 % vaginal gel   1  . mupirocin ointment (BACTROBAN) 2 % aplly to affected area BID prn 30 g 1  . NIFEdipine (PROCARDIA XL/ADALAT-CC) 60 MG 24 hr tablet Take 1 tablet (60 mg total) by mouth daily. 30 tablet 5  . nystatin (MYCOSTATIN) powder Apply topically 2 (two) times daily as needed. 30 g 0  . triamcinolone cream (KENALOG) 0.1 % Apply 1 application topically 2 (two) times daily as needed. Not more than 2 weeks at a time 30 g 5   No current facility-administered medications on file prior to visit.     Past Medical History:  Diagnosis Date  . Anxiety    no meds  . Depression    no meds  . Hypertension    does not take BP med regularly, instructed to take med nest 3 days.    Past Surgical History:  Procedure Laterality Date  . DILATION AND CURETTAGE OF UTERUS     x 2 polyps removed  . DILATION AND EVACUATION N/A 02/10/2013   Procedure:  DILATATION AND EVACUATION;  Surgeon: Zelphia Cairo, MD;  Location: [facility];  Service: Gynecology;  Laterality: N/A;  with intra operative [place]  . SPINE SURGERY     patient denies having spine surgery  . WISDOM TOOTH EXTRACTION  03/2014    Social History   Social History  . Marital status: Single    Spouse name: n/a  . Number of children: 0  . Years of education: N/A   Occupational History  . assessment counselor     [employer]   Social History Main Topics  . Smoking status: Never Smoker  . Smokeless tobacco: Never Used  . Alcohol use 1.8 oz/week    3 Standard drinks or equivalent per week     Comment: weekends - mixed drinks  . Drug use: No  . Sexual activity: Yes    Partners: Male    Birth control/ protection: None   Other Topics Concern  . Not on file   Social History Narrative   Works in ED - behavioral assessments      Exercise:  Zumba, walking    Family History  Problem Relation Age of Onset  . Diabetes Father   . Hypertension Father   . Hypertension Sister   . Diabetes Maternal Grandmother   .  Hypertension Maternal Grandmother   . Hypertension Brother     Review of Systems     Objective:  There were no vitals filed for this visit. There were no vitals filed for this visit. There is no height or weight on file to calculate BMI.   Physical Exam         Assessment & Plan:       This encounter was created in error - please disregard.

## 2015-10-28 ENCOUNTER — Encounter: Payer: 59 | Admitting: Internal Medicine

## 2015-11-21 ENCOUNTER — Ambulatory Visit: Payer: Self-pay | Admitting: Sports Medicine

## 2015-11-22 ENCOUNTER — Ambulatory Visit (INDEPENDENT_AMBULATORY_CARE_PROVIDER_SITE_OTHER): Payer: 59 | Admitting: Sports Medicine

## 2015-11-22 DIAGNOSIS — M545 Low back pain: Secondary | ICD-10-CM

## 2015-11-22 DIAGNOSIS — G8929 Other chronic pain: Secondary | ICD-10-CM

## 2015-11-22 DIAGNOSIS — M79672 Pain in left foot: Secondary | ICD-10-CM

## 2015-11-22 DIAGNOSIS — M79671 Pain in right foot: Secondary | ICD-10-CM | POA: Diagnosis not present

## 2015-11-22 NOTE — Assessment & Plan Note (Signed)
Custom orthotics as above.  Return as needed. 

## 2015-11-22 NOTE — Progress Notes (Signed)

## 2015-11-22 NOTE — Assessment & Plan Note (Addendum)
Resolved

## 2015-12-05 MED FILL — TRIAMCINOLONE 0.1% CREAM: 0.1 | 14 days supply | Qty: 30 | Fill #3

## 2015-12-05 MED FILL — EPIDUO FORTE 0.3-2.5% GEL P: 0.3-2.5 | 30 days supply | Qty: 45 | Fill #0

## 2015-12-19 MED FILL — FLUCONAZOLE 150 MG TABLET: 150 | 2 days supply | Qty: 2 | Fill #0

## 2015-12-19 MED FILL — TRIAMCINOLONE 0.1% CREAM: 0.1 | 14 days supply | Qty: 30 | Fill #4

## 2015-12-30 ENCOUNTER — Other Ambulatory Visit (INDEPENDENT_AMBULATORY_CARE_PROVIDER_SITE_OTHER): Payer: 59

## 2015-12-30 ENCOUNTER — Encounter: Payer: Self-pay | Admitting: Internal Medicine

## 2015-12-30 ENCOUNTER — Ambulatory Visit (INDEPENDENT_AMBULATORY_CARE_PROVIDER_SITE_OTHER): Payer: 59 | Admitting: Internal Medicine

## 2015-12-30 VITALS — BP 138/98 | HR 78 | Temp 98.3°F | Resp 16 | Ht 64.0 in | Wt 184.0 lb

## 2015-12-30 DIAGNOSIS — R21 Rash and other nonspecific skin eruption: Secondary | ICD-10-CM | POA: Diagnosis not present

## 2015-12-30 DIAGNOSIS — Z0000 Encounter for general adult medical examination without abnormal findings: Secondary | ICD-10-CM | POA: Diagnosis not present

## 2015-12-30 DIAGNOSIS — I10 Essential (primary) hypertension: Secondary | ICD-10-CM | POA: Diagnosis not present

## 2015-12-30 DIAGNOSIS — Z833 Family history of diabetes mellitus: Secondary | ICD-10-CM | POA: Diagnosis not present

## 2015-12-30 DIAGNOSIS — E042 Nontoxic multinodular goiter: Secondary | ICD-10-CM | POA: Diagnosis not present

## 2015-12-30 DIAGNOSIS — F418 Other specified anxiety disorders: Secondary | ICD-10-CM

## 2015-12-30 DIAGNOSIS — Z23 Encounter for immunization: Secondary | ICD-10-CM | POA: Diagnosis not present

## 2015-12-30 DIAGNOSIS — L309 Dermatitis, unspecified: Secondary | ICD-10-CM

## 2015-12-30 LAB — COMPREHENSIVE METABOLIC PANEL
ALT: 7 U/L (ref 0–35)
AST: 11 U/L (ref 0–37)
Albumin: 4.4 g/dL (ref 3.5–5.2)
Alkaline Phosphatase: 49 U/L (ref 39–117)
BUN: 15 mg/dL (ref 6–23)
CO2: 26 mEq/L (ref 19–32)
Calcium: 9.5 mg/dL (ref 8.4–10.5)
Chloride: 103 mEq/L (ref 96–112)
Creatinine, Ser: 0.68 mg/dL (ref 0.40–1.20)
GFR: 126.5 mL/min (ref >60.00)
Glucose, Bld: 89 mg/dL (ref 70–99)
Potassium: 3.5 mEq/L (ref 3.5–5.1)
Sodium: 137 mEq/L (ref 135–145)
Total Bilirubin: 0.3 mg/dL (ref 0.2–1.2)
Total Protein: 7.7 g/dL (ref 6.0–8.3)

## 2015-12-30 LAB — LIPID PANEL
Cholesterol: 204 mg/dL — ABNORMAL HIGH (ref 0–200)
HDL: 58.9 mg/dL (ref >39.00)
LDL Cholesterol: 131 mg/dL — ABNORMAL HIGH (ref 0–99)
NonHDL: 145.34
Total CHOL/HDL Ratio: 3
Triglycerides: 74 mg/dL (ref 0.0–149.0)
VLDL: 14.8 mg/dL (ref 0.0–40.0)

## 2015-12-30 LAB — CBC WITH DIFFERENTIAL/PLATELET
Basophils Absolute: 0 10*3/uL (ref 0.0–0.1)
Basophils Relative: 0.7 % (ref 0.0–3.0)
Eosinophils Absolute: 0.3 10*3/uL (ref 0.0–0.7)
Eosinophils Relative: 6 % — ABNORMAL HIGH (ref 0.0–5.0)
HCT: 35.8 % — ABNORMAL LOW (ref 36.0–46.0)
Hemoglobin: 12.2 g/dL (ref 12.0–15.0)
Lymphocytes Relative: 39.4 % (ref 12.0–46.0)
Lymphs Abs: 2.2 10*3/uL (ref 0.7–4.0)
MCHC: 34 g/dL (ref 30.0–36.0)
MCV: 86.5 fl (ref 78.0–100.0)
Monocytes Absolute: 0.4 10*3/uL (ref 0.1–1.0)
Monocytes Relative: 7.9 % (ref 3.0–12.0)
Neutro Abs: 2.5 10*3/uL (ref 1.4–7.7)
Neutrophils Relative %: 46 % (ref 43.0–77.0)
Platelets: 200 10*3/uL (ref 150.0–400.0)
RBC: 4.14 Mil/uL (ref 3.87–5.11)
RDW: 14.1 % (ref 11.5–15.5)
WBC: 5.5 10*3/uL (ref 4.0–10.5)

## 2015-12-30 LAB — HEMOGLOBIN A1C: Hgb A1c MFr Bld: 5.6 % (ref 4.6–6.5)

## 2015-12-30 LAB — TSH: TSH: 1.37 u[IU]/mL (ref 0.35–4.50)

## 2015-12-30 MED ORDER — METOPROLOL SUCCINATE ER 25 MG PO TB24: 25.0000 mg | ORAL_TABLET | Freq: Every day | ORAL | 3 refills | Status: DC

## 2015-12-30 MED ORDER — CLOBETASOL PROP EMOLLIENT BASE 0.05 % EX CREA: 1.0000 "application " | TOPICAL_CREAM | Freq: Two times a day (BID) | CUTANEOUS | 3 refills | Status: DC

## 2015-12-30 MED FILL — CLOBETASOL EMOLLIENT 0.05%: 0.05 | 14 days supply | Qty: 30 | Fill #0

## 2015-12-30 MED FILL — METOPROLOL SUCC ER 25 MG TA: 25 | 30 days supply | Qty: 30 | Fill #0

## 2015-12-30 NOTE — Assessment & Plan Note (Addendum)
B/l axilla region ? Related to deo Contact dermatitis versus eczema We will try clobetasol, which is strongly Triamcinolone If no more if rash recurs frequently she will see her dermatologist

## 2015-12-30 NOTE — Assessment & Plan Note (Signed)
Blood pressure not currently controlled Has not tolerated several medications including lisinopril,? Will start, hydrochlorothiazide, nifedipine She states she would have difficulty taking her medication more than once most likely. Continue low-sodium diet Continue regular exercise Advised weight loss Discussed that we need to try medication and if she has side effects we can change-advised her that if she continues to have urinary incontinence she may also need to see urology-this may not be related to the medication completely She will start monitoring her blood pressure at home and update me with her measurements so we can adjust or change medication CMP today

## 2015-12-30 NOTE — Assessment & Plan Note (Signed)
Active [place]ema [place] We'll start using a stronger steroid cream If no improvement or if eczema persists she will return for a dermatologist for further evaluation and treatment

## 2015-12-30 NOTE — Progress Notes (Signed)
Subjective:    Patient ID: Candace [name], female    DOB: 06-21-1980, 35 y.o.   MRN: [id number]  HPI She is here for a physical exam.   Hypertension:  She was taking nifedipine and it caused incontinence twice in one day.  She stopped the medication.  She was diltiazem in the past and thinks she was taken off due to being in the child bearing age.  She does want to have children at some point, but not time soon. She was on lisinopril, hydrochlorothiazide in the past and had side effects -she thinks they both caused problems with incontinence. She was on metoprolol in the past, but it appears that it was in combination with hydrochlorothiazide. She is usually compliant with a low sodium diet.  She is exericising regularly.   Rash: She has a rash under both armpits. She initially thought it was eczema because she does have eczema and has tried using her steroid cream. They did help, but never took it away completely. She also try Bactroban and that seemed to help a little as well. The skin in her axilla is darker since the rash started. She does have some active eczema on her chest and neck area. She has seen a dermatologist in the past, but not recently.  Medications and allergies reviewed with patient and updated if appropriate.  Patient Active Problem List   Diagnosis Date Noted  . Bilateral foot pain 09/23/2015  . Low back pain 09/23/2015  . Vitamin D deficiency 08/12/2015  . Alopecia 06/11/2014  . Situational depression 11/21/2012  . Edema extremities 04/27/2012  . Multinodular thyroid 12/01/2011  . Situational anxiety 11/26/2011  . Insomnia 11/26/2011  . Urinary urgency 11/26/2011  . Cervical polyp 11/26/2011  . Eczema 11/26/2011  . Essential hypertension, benign 11/25/2011    Current Outpatient Prescriptions on File Prior to Visit  Medication Sig Dispense Refill  . ALPRAZolam (XANAX) 1 MG tablet Take 1 mg by mouth at bedtime as needed for anxiety.    . DUREZOL 0.05 % EMUL   1   . lidocaine (XYLOCAINE) 5 % ointment   0  . meloxicam (MOBIC) 15 MG tablet One tab PO qAM with breakfast for 2 weeks, then daily prn pain. 30 tablet 3  . metroNIDAZOLE (METROGEL) 0.75 % vaginal gel   1  . mupirocin ointment (BACTROBAN) 2 % aplly to affected area BID prn 30 g 1  . NIFEdipine (PROCARDIA XL/ADALAT-CC) 60 MG 24 hr tablet Take 1 tablet (60 mg total) by mouth daily. 30 tablet 5  . nystatin (MYCOSTATIN) powder Apply topically 2 (two) times daily as needed. 30 g 0  . triamcinolone cream (KENALOG) 0.1 % Apply 1 application topically 2 (two) times daily as needed. Not more than 2 weeks at a time 30 g 5   No current facility-administered medications on file prior to visit.     Past Medical History:  Diagnosis Date  . Anxiety    no meds  . Depression    no meds  . Hypertension    does not take BP med regularly, instructed to take med nest 3 days.    Past Surgical History:  Procedure Laterality Date  . DILATION AND CURETTAGE OF UTERUS     x 2 polyps removed  . DILATION AND EVACUATION N/A 02/10/2013   Procedure: DILATATION AND EVACUATION;  Surgeon: Zelphia CairoGretchen Adkins, MD;  Location: [facility];  Service: Gynecology;  Laterality: N/A;  with intra operative us  . SPINE SURGERY  patient denies having spine surgery  . WISDOM TOOTH EXTRACTION  03/2014    Social History   Social History  . Marital status: Single    Spouse name: n/a  . Number of children: 0  . Years of education: N/A   Occupational History  . assessment counselor     [employer]   Social History Main Topics  . Smoking status: Never Smoker  . Smokeless tobacco: Never Used  . Alcohol use 1.8 oz/week    3 Standard drinks or equivalent per week     Comment: weekends - mixed drinks  . Drug use: No  . Sexual activity: Yes    Partners: Male    Birth control/ protection: None   Other Topics Concern  . Not on file   Social History Narrative   Works in ED - behavioral assessments      Exercise:  Zumba,  walking    Family History  Problem Relation Age of Onset  . Diabetes Father   . Hypertension Father   . Hypertension Sister   . Diabetes Maternal Grandmother   . Hypertension Maternal Grandmother   . Hypertension Brother     Review of Systems  Constitutional: Negative for chills and fever.  Eyes: Negative for visual disturbance.  Respiratory: Negative for cough, shortness of breath and wheezing.   Cardiovascular: Negative for chest pain, palpitations and leg swelling.  Gastrointestinal: Positive for constipation and nausea (occ with menses). Negative for abdominal pain, blood in stool and diarrhea.       No gerd  Genitourinary: Negative for difficulty urinating, dysuria and hematuria.  Musculoskeletal: Positive for back pain (lower back - saw ortho). Negative for arthralgias.  Skin: Positive for rash (b/l axillary areas).  Neurological: Positive for light-headedness (in morning) and headaches (in morning).  Psychiatric/Behavioral: Positive for dysphoric mood (occasional). The patient is nervous/anxious (occasional).        Objective:   Vitals:   12/30/15 1535  BP: (!) 138/98  Pulse: 78  Resp: 16  Temp: 98.3 F (36.8 C)   Filed Weights   12/30/15 1535  Weight: 184 lb (83.5 kg)   Body mass index is 31.58 kg/m.   Physical Exam Constitutional: She appears well-developed and well-nourished. No distress.  HENT:  Head: Normocephalic and atraumatic.  Right Ear: External ear normal. Normal ear canal and TM Left Ear: External ear normal.  Normal ear canal and TM Mouth/Throat: Oropharynx is clear and moist.  Eyes: Conjunctivae and EOM are normal.  Neck: Neck supple. No tracheal deviation present. thyromegaly present - nodules palpable.  No carotid bruit  Cardiovascular: Normal rate, regular rhythm and normal heart sounds.   No murmur heard.  No edema. Pulmonary/Chest: Effort normal and breath sounds normal. No respiratory distress. She has no wheezes. She has no rales.    Breast: deferred to Gyn Abdominal: Soft. She exhibits no distension. There is no tenderness.  Lymphadenopathy: She has no cervical adenopathy.  Skin: Skin is warm and dry. She is not diaphoretic.  mild papular's with areas of dryness chest and posterior neck consistent with eczema, similar papules and right axilla. Bilateral axillas with hyperpigmentation, but without papules or macules  Psychiatric: She has a normal mood and affect. Her behavior is normal.        Assessment & Plan:   Physical exam: Screening blood work  ordered Immunizations flu is up to date, tdap today Gyn Up to date -Has follow-up next month-needs blood pressure be controlled to restart birth control Exercise  -  regular Weight - overweight  Skin  - rash and eczema active-we will try clobetasol cream to see dermatology if no improvement Substance abuse - none  See Problem List for Assessment and Plan of chronic medical problems.

## 2015-12-30 NOTE — Progress Notes (Signed)
Pre visit review using our clinic review tool, if applicable. No additional management support is needed unless otherwise documented below in the visit note. 

## 2015-12-30 NOTE — Assessment & Plan Note (Signed)
Check TSH 

## 2015-12-30 NOTE — Assessment & Plan Note (Signed)
Rarely uses Xanax-has decreased her use

## 2015-12-30 NOTE — Patient Instructions (Addendum)
  Test(s) ordered today. Your results will be released to MyChart (or called to you) after review, usually within 72hours after test completion. If any changes need to be made, you will be notified at that same time.  All other Health Maintenance issues reviewed.   All recommended immunizations and age-appropriate screenings are up-to-date or discussed.  Tetanus immunization administered today.   Medications reviewed and updated.  Changes include starting metoprolol 25 mg daily for your blood pressure.  Also try clobetasol for your rash.   Your prescription(s) have been submitted to your pharmacy. Please take as directed and contact our office if you believe you are having problem(s) with the medication(s).   Please followup in 6 months

## 2016-01-13 ENCOUNTER — Encounter: Payer: Self-pay | Admitting: Internal Medicine

## 2016-01-16 MED ORDER — ESCITALOPRAM OXALATE 10 MG PO TABS: 10.0000 mg | ORAL_TABLET | Freq: Every day | ORAL | 1 refills | Status: DC

## 2016-01-16 NOTE — Addendum Note (Signed)
Addended by: Pincus SanesBURNS, Almena Hokenson J on: 01/16/2016 06:45 PM   Modules accepted: Orders

## 2016-01-23 DIAGNOSIS — Z6832 Body mass index (BMI) 32.0-32.9, adult: Secondary | ICD-10-CM | POA: Diagnosis not present

## 2016-01-23 DIAGNOSIS — D509 Iron deficiency anemia, unspecified: Secondary | ICD-10-CM | POA: Diagnosis not present

## 2016-01-23 DIAGNOSIS — Z01419 Encounter for gynecological examination (general) (routine) without abnormal findings: Secondary | ICD-10-CM | POA: Diagnosis not present

## 2016-02-12 ENCOUNTER — Encounter: Payer: Self-pay | Admitting: Internal Medicine

## 2016-02-16 ENCOUNTER — Encounter: Payer: Self-pay | Admitting: Internal Medicine

## 2016-02-19 MED ORDER — METOPROLOL SUCCINATE ER 50 MG PO TB24: 50.0000 mg | ORAL_TABLET | Freq: Every day | ORAL | 3 refills | Status: DC

## 2016-02-20 DIAGNOSIS — D649 Anemia, unspecified: Secondary | ICD-10-CM | POA: Diagnosis not present

## 2016-02-26 DIAGNOSIS — H40013 Open angle with borderline findings, low risk, bilateral: Secondary | ICD-10-CM | POA: Diagnosis not present

## 2016-02-26 DIAGNOSIS — H1013 Acute atopic conjunctivitis, bilateral: Secondary | ICD-10-CM | POA: Diagnosis not present

## 2016-02-26 DIAGNOSIS — H5213 Myopia, bilateral: Secondary | ICD-10-CM | POA: Diagnosis not present

## 2016-02-26 DIAGNOSIS — H04123 Dry eye syndrome of bilateral lacrimal glands: Secondary | ICD-10-CM | POA: Diagnosis not present

## 2016-02-26 MED FILL — METOPROLOL SUCC ER 25 MG TA: 25 | 90 days supply | Qty: 90 | Fill #1

## 2016-03-17 ENCOUNTER — Encounter: Payer: Self-pay | Admitting: Emergency Medicine

## 2016-03-18 DIAGNOSIS — Z0289 Encounter for other administrative examinations: Secondary | ICD-10-CM

## 2016-04-03 ENCOUNTER — Encounter: Payer: Self-pay | Admitting: Internal Medicine

## 2016-04-06 NOTE — Telephone Encounter (Signed)
LVM with pt to discuss over the phone rather than via email to minimize confusion.

## 2016-04-11 ENCOUNTER — Encounter: Payer: Self-pay | Admitting: Internal Medicine

## 2016-04-14 NOTE — Telephone Encounter (Signed)
Spoke with pt to clarify. Pt will be sending a new copy to fill out and send back to employer.

## 2016-04-23 ENCOUNTER — Ambulatory Visit: Payer: Self-pay | Admitting: *Deleted

## 2016-04-29 ENCOUNTER — Encounter: Payer: Self-pay | Admitting: Internal Medicine

## 2016-05-04 ENCOUNTER — Encounter: Payer: Self-pay | Admitting: Internal Medicine

## 2016-05-08 ENCOUNTER — Encounter: Payer: Self-pay | Admitting: Internal Medicine

## 2016-05-08 ENCOUNTER — Other Ambulatory Visit: Payer: Self-pay | Admitting: Internal Medicine

## 2016-05-08 MED ORDER — METOPROLOL SUCCINATE ER 50 MG PO TB24: 50.0000 mg | ORAL_TABLET | Freq: Every day | ORAL | 2 refills | Status: DC

## 2016-05-09 NOTE — Telephone Encounter (Signed)
Prescription sent 05/08/16

## 2016-05-12 ENCOUNTER — Telehealth: Payer: Self-pay | Admitting: Internal Medicine

## 2016-05-12 NOTE — Telephone Encounter (Signed)
Pt requesting refill of metoprolol succinate (TOPROL-XL) 50 MG to be sent to [facility] patient

## 2016-05-13 ENCOUNTER — Ambulatory Visit: Payer: Self-pay | Admitting: *Deleted

## 2016-05-13 MED ORDER — METOPROLOL SUCCINATE ER 50 MG PO TB24: 50.0000 mg | ORAL_TABLET | Freq: Every day | ORAL | 0 refills | Status: DC

## 2016-05-21 ENCOUNTER — Encounter: Payer: Self-pay | Admitting: Internal Medicine

## 2016-05-21 ENCOUNTER — Other Ambulatory Visit: Payer: Self-pay | Admitting: Internal Medicine

## 2016-05-22 ENCOUNTER — Other Ambulatory Visit: Payer: Self-pay | Admitting: Emergency Medicine

## 2016-05-22 ENCOUNTER — Other Ambulatory Visit: Payer: Self-pay | Admitting: Internal Medicine

## 2016-05-22 MED ORDER — METOPROLOL SUCCINATE ER 50 MG PO TB24: 50.0000 mg | ORAL_TABLET | Freq: Every day | ORAL | 0 refills | Status: DC

## 2016-05-22 MED FILL — METOPROLOL SUCC ER 50 MG TA: 50 | 90 days supply | Qty: 90 | Fill #0

## 2016-05-27 ENCOUNTER — Ambulatory Visit: Payer: Self-pay | Admitting: *Deleted

## 2016-06-25 NOTE — Progress Notes (Signed)
Subjective:    Patient ID: Candace [name], female    DOB: 03/10/1980, 36 y.o.   MRN: [id number]  HPI The patient is here for follow up.  Hypertension: She is not taking her medication daily. She does monitor her blood pressure at home intermittently and it is typically 128-138/92-98. Even when she takes the medication regularly or diastolic pressure is never less than 90. She does not like taking pills. She is compliant with a low sodium diet.  She denies chest pain, palpitations, edema, shortness of breath and regular headaches. She is exercising regularly, but not as much as she should - at least once a week.      Depression, anxiety: She is not taking her medication daily as prescribed. She denies any side effects from the medication, she just does not like taking pills. She still has anxiety depression and states she probably should be taking the medication.  She rarely takes xanax - 1 mg in the morning.  She will start therapy for her depression.   Obesity: She wonders about medication that would help her is weight. She is exercising some, but could do more.  Medications and allergies reviewed with patient and updated if appropriate.  Patient Active Problem List   Diagnosis Date Noted  . Rash and nonspecific skin eruption 12/30/2015  . Bilateral foot pain 09/23/2015  . Low back pain 09/23/2015  . Vitamin D deficiency 08/12/2015  . Alopecia 06/11/2014  . Situational depression 11/21/2012  . Multinodular thyroid 12/01/2011  . Situational anxiety 11/26/2011  . Insomnia 11/26/2011  . Urinary urgency 11/26/2011  . Cervical polyp 11/26/2011  . Eczema 11/26/2011  . Essential hypertension, benign 11/25/2011    Current Outpatient Prescriptions on File Prior to Visit  Medication Sig Dispense Refill  . Adapalene-Benzoyl Peroxide (EPIDUO FORTE) 0.3-2.5 % GEL Apply topically.    . ALPRAZolam (XANAX) 1 MG tablet Take 1 mg by mouth at bedtime as needed for anxiety.    . Clobetasol Prop  Emollient Base (CLOBETASOL PROPIONATE E) 0.05 % emollient cream Apply 1 application topically 2 (two) times daily. 30 g 3  . DUREZOL 0.05 % EMUL   1  . escitalopram (LEXAPRO) 10 MG tablet Take 1 tablet (10 mg total) by mouth daily. 90 tablet 1  . lidocaine (XYLOCAINE) 5 % ointment   0  . metoprolol succinate (TOPROL-XL) 50 MG 24 hr tablet Take 1 tablet (50 mg total) by mouth daily. Take with or immediately following a meal. 90 tablet 0  . metroNIDAZOLE (METROGEL) 0.75 % vaginal gel   1  . mupirocin ointment (BACTROBAN) 2 % aplly to affected area BID prn 30 g 1  . nystatin (MYCOSTATIN) powder Apply topically 2 (two) times daily as needed. 30 g 0   No current facility-administered medications on file prior to visit.     Past Medical History:  Diagnosis Date  . Anxiety    no meds  . Depression    no meds  . Hypertension    does not take BP med regularly, instructed to take med nest 3 days.    Past Surgical History:  Procedure Laterality Date  . DILATION AND CURETTAGE OF UTERUS     x 2 polyps removed  . DILATION AND EVACUATION N/A 02/10/2013   Procedure: DILATATION AND EVACUATION;  Surgeon: Zelphia CairoGretchen Adkins, MD;  Location: [facility];  Service: Gynecology;  Laterality: N/A;  with intra operative us  . SPINE SURGERY     patient denies having spine surgery  .  WISDOM TOOTH EXTRACTION  03/2014    Social History   Social History  . Marital status: Single    Spouse name: n/a  . Number of children: 0  . Years of education: N/A   Occupational History  . assessment counselor     [employer]   Social History Main Topics  . Smoking status: Never Smoker  . Smokeless tobacco: Never Used  . Alcohol use 1.8 oz/week    3 Standard drinks or equivalent per week     Comment: weekends - mixed drinks  . Drug use: No  . Sexual activity: Yes    Partners: Male    Birth control/ protection: None   Other Topics Concern  . None   Social History Narrative   Works in ED - behavioral assessments        Exercise:  Zumba, walking    Family History  Problem Relation Age of Onset  . Diabetes Father   . Hypertension Father   . Hypertension Sister   . Hypertension Brother   . Diabetes Maternal Grandmother   . Hypertension Maternal Grandmother     Review of Systems  Constitutional: Negative for chills and fever.  Respiratory: Negative for cough, shortness of breath and wheezing.   Cardiovascular: Positive for palpitations (stress related). Negative for chest pain and leg swelling.  Neurological: Positive for headaches (rarely). Negative for dizziness and light-headedness.  Psychiatric/Behavioral: Positive for dysphoric mood. Negative for suicidal ideas. The patient is nervous/anxious.        Objective:   Vitals:   06/26/16 1114  BP: (!) 142/94  Pulse: 97  Resp: 16  Temp: 98.7 F (37.1 C)   Wt Readings from Last 3 Encounters:  06/26/16 188 lb (85.3 kg)  12/30/15 184 lb (83.5 kg)  11/22/15 184 lb (83.5 kg)   Body mass index is 32.27 kg/m.   Physical Exam    Constitutional: Appears well-developed and well-nourished. No distress.  HENT:  Head: Normocephalic and atraumatic.  Neck: Neck supple. No tracheal deviation present. No thyromegaly present.  No cervical lymphadenopathy Cardiovascular: Normal rate, regular rhythm and normal heart sounds.   No murmur heard. No carotid bruit .  No edema Pulmonary/Chest: Effort normal and breath sounds normal. No respiratory distress. No has no wheezes. No rales.  Skin: Skin is warm and dry. Not diaphoretic.  Psychiatric: Normal mood and affect. Behavior is normal.      Assessment & Plan:    See Problem List for Assessment and Plan of chronic medical problems.

## 2016-06-26 ENCOUNTER — Encounter: Payer: Self-pay | Admitting: Internal Medicine

## 2016-06-26 ENCOUNTER — Ambulatory Visit (INDEPENDENT_AMBULATORY_CARE_PROVIDER_SITE_OTHER): Payer: 59 | Admitting: Internal Medicine

## 2016-06-26 VITALS — BP 142/94 | HR 97 | Temp 98.7°F | Resp 16 | Wt 188.0 lb

## 2016-06-26 DIAGNOSIS — T7840XD Allergy, unspecified, subsequent encounter: Secondary | ICD-10-CM

## 2016-06-26 DIAGNOSIS — I10 Essential (primary) hypertension: Secondary | ICD-10-CM

## 2016-06-26 DIAGNOSIS — F4321 Adjustment disorder with depressed mood: Secondary | ICD-10-CM

## 2016-06-26 DIAGNOSIS — Z6832 Body mass index (BMI) 32.0-32.9, adult: Secondary | ICD-10-CM | POA: Diagnosis not present

## 2016-06-26 DIAGNOSIS — E669 Obesity, unspecified: Secondary | ICD-10-CM | POA: Insufficient documentation

## 2016-06-26 DIAGNOSIS — E6609 Other obesity due to excess calories: Secondary | ICD-10-CM

## 2016-06-26 DIAGNOSIS — F418 Other specified anxiety disorders: Secondary | ICD-10-CM

## 2016-06-26 DIAGNOSIS — T7840XA Allergy, unspecified, initial encounter: Secondary | ICD-10-CM | POA: Insufficient documentation

## 2016-06-26 MED ORDER — AMLODIPINE BESYLATE 5 MG PO TABS: 5.0000 mg | ORAL_TABLET | Freq: Every day | ORAL | 3 refills | Status: DC

## 2016-06-26 MED ORDER — ALPRAZOLAM 1 MG PO TABS: 1.0000 mg | ORAL_TABLET | Freq: Every evening | ORAL | 0 refills | Status: DC | PRN

## 2016-06-26 NOTE — Assessment & Plan Note (Signed)
?   From deodorant Has seen dermatology Interested in knowing if she has an allergy-we will refer to allergy

## 2016-06-26 NOTE — Assessment & Plan Note (Signed)
Impression not ideally controlled We'll be seeing a therapist starting next week Recommended restarting Lexapro and taking daily Increase exercise

## 2016-06-26 NOTE — Assessment & Plan Note (Signed)
Blood pressure elevated She states it is now controlled on metoprolol and she is not taking Discontinue metoprolol Start amlodipine 5 mg daily Discussed consequences of uncontrolled blood pressure Increase exercise Work on weight loss Stressed taking the medication on a daily basis

## 2016-06-26 NOTE — Patient Instructions (Addendum)
Qsymia Belviq Contrave Saxenda - injections  Your goal BP is less than 130/80.   Test(s) ordered today. Your results will be released to MyChart (or called to you) after review, usually within 72hours after test completion. If any changes need to be made, you will be notified at that same time.   Medications reviewed and updated.  Changes include stop the metoprolol and start amlodipine 5 mg daily for your blood pressure.   Your prescription(s) have been submitted to your pharmacy. Please take as directed and contact our office if you believe you are having problem(s) with the medication(s).   A referral was ordered for allergy.

## 2016-06-26 NOTE — Assessment & Plan Note (Signed)
Will start therapy Takes Xanax on a rare occasion-okay to continue, but advised to keep to a minimum Recommended restarting Lexapro and taking daily Increase exercise

## 2016-06-26 NOTE — Assessment & Plan Note (Signed)
Stressed decreasing portions and healthy diet Stressed increasing exercise She is interested in weight loss medication-discussed several medications. She will call her insurance company and see what is covered with a new Will need a four-week follow-up after starting a new medication

## 2016-07-07 ENCOUNTER — Encounter: Payer: Self-pay | Admitting: Internal Medicine

## 2016-07-07 MED FILL — AMLODIPINE BESYLATE 5 MG TA: 5 | 90 days supply | Qty: 90 | Fill #0

## 2016-07-07 MED FILL — ALPRAZolam 1 MG TABS: 1 | 30 days supply | Qty: 30 | Fill #0

## 2016-07-09 MED ORDER — NALTREXONE-BUPROPION HCL ER 8-90 MG PO TB12: ORAL_TABLET | ORAL | 0 refills | Status: DC

## 2016-07-14 ENCOUNTER — Telehealth: Payer: Self-pay

## 2016-07-14 NOTE — Telephone Encounter (Signed)
PA started for Contrave ER 8-90 mg tablets.  Key: [id number]

## 2016-07-17 ENCOUNTER — Telehealth: Payer: Self-pay

## 2016-07-17 NOTE — Telephone Encounter (Signed)
Received PA for contrave. Please advise if this is needed. Medication is not listed on current med list.

## 2016-07-17 NOTE — Telephone Encounter (Signed)
Yes needed - on med list as generic

## 2016-07-29 ENCOUNTER — Telehealth: Payer: Self-pay | Admitting: Emergency Medicine

## 2016-07-29 MED FILL — CONTRAVE ER 8-90 MG TABLET: 8-90 | 28 days supply | Qty: 70 | Fill #0

## 2016-07-29 NOTE — Telephone Encounter (Signed)
Received approval notice via fax for Contrave. Pharmacy notified.

## 2016-07-30 ENCOUNTER — Encounter: Payer: Self-pay | Admitting: Internal Medicine

## 2016-07-30 ENCOUNTER — Other Ambulatory Visit: Payer: Self-pay | Admitting: Emergency Medicine

## 2016-07-30 DIAGNOSIS — R3989 Other symptoms and signs involving the genitourinary system: Secondary | ICD-10-CM

## 2016-07-30 DIAGNOSIS — R350 Frequency of micturition: Secondary | ICD-10-CM

## 2016-07-30 MED ORDER — NITROFURANTOIN MONOHYD MACRO 100 MG PO CAPS: 100.0000 mg | ORAL_CAPSULE | Freq: Two times a day (BID) | ORAL | 0 refills | Status: DC

## 2016-07-30 MED FILL — CEPHALEXIN 500 MG CAPSULE: 500 | 7 days supply | Qty: 14 | Fill #0

## 2016-07-30 NOTE — Telephone Encounter (Signed)
She needs to drop off a sample.  I will send in an antibiotic

## 2016-09-04 ENCOUNTER — Encounter: Payer: Self-pay | Admitting: Internal Medicine

## 2016-09-04 DIAGNOSIS — R0681 Apnea, not elsewhere classified: Secondary | ICD-10-CM

## 2016-09-21 ENCOUNTER — Other Ambulatory Visit: Payer: Self-pay | Admitting: Internal Medicine

## 2016-09-21 ENCOUNTER — Encounter: Payer: Self-pay | Admitting: Internal Medicine

## 2016-09-22 MED ORDER — NALTREXONE-BUPROPION HCL ER 8-90 MG PO TB12: 2.0000 | ORAL_TABLET | Freq: Two times a day (BID) | ORAL | 2 refills | Status: DC

## 2016-09-23 MED FILL — CONTRAVE ER 8-90 MG TABLET: 8-90 | 30 days supply | Qty: 120 | Fill #0

## 2016-10-22 ENCOUNTER — Encounter: Payer: Self-pay | Admitting: Internal Medicine

## 2016-10-24 ENCOUNTER — Encounter: Payer: Self-pay | Admitting: Internal Medicine

## 2016-10-27 ENCOUNTER — Encounter: Payer: Self-pay | Admitting: Internal Medicine

## 2016-10-27 NOTE — Telephone Encounter (Signed)
Pt called in about the messages she has sent on my chart.  She would like someone to contact her about them.  She has some questions

## 2016-10-28 NOTE — Telephone Encounter (Signed)
LVM informing pt letter can be printed via MyChart or to contact office if she would like it faxed somewhere.

## 2016-11-20 ENCOUNTER — Encounter: Payer: Self-pay | Admitting: Internal Medicine

## 2016-11-20 ENCOUNTER — Ambulatory Visit (INDEPENDENT_AMBULATORY_CARE_PROVIDER_SITE_OTHER): Payer: 59 | Admitting: Internal Medicine

## 2016-11-20 ENCOUNTER — Encounter: Payer: 59 | Admitting: Internal Medicine

## 2016-11-20 ENCOUNTER — Other Ambulatory Visit (INDEPENDENT_AMBULATORY_CARE_PROVIDER_SITE_OTHER): Payer: 59

## 2016-11-20 VITALS — BP 142/96 | HR 89 | Temp 98.9°F | Resp 16 | Wt 193.0 lb

## 2016-11-20 DIAGNOSIS — R739 Hyperglycemia, unspecified: Secondary | ICD-10-CM | POA: Diagnosis not present

## 2016-11-20 DIAGNOSIS — F418 Other specified anxiety disorders: Secondary | ICD-10-CM

## 2016-11-20 DIAGNOSIS — I10 Essential (primary) hypertension: Secondary | ICD-10-CM

## 2016-11-20 DIAGNOSIS — E042 Nontoxic multinodular goiter: Secondary | ICD-10-CM | POA: Diagnosis not present

## 2016-11-20 DIAGNOSIS — F4321 Adjustment disorder with depressed mood: Secondary | ICD-10-CM | POA: Diagnosis not present

## 2016-11-20 LAB — CBC WITH DIFFERENTIAL/PLATELET
Basophils Absolute: 0 10*3/uL (ref 0.0–0.1)
Basophils Relative: 0.8 % (ref 0.0–3.0)
Eosinophils Absolute: 0.4 10*3/uL (ref 0.0–0.7)
Eosinophils Relative: 7.2 % — ABNORMAL HIGH (ref 0.0–5.0)
HCT: 39.4 % (ref 36.0–46.0)
Hemoglobin: 13 g/dL (ref 12.0–15.0)
Lymphocytes Relative: 29.5 % (ref 12.0–46.0)
Lymphs Abs: 1.4 10*3/uL (ref 0.7–4.0)
MCHC: 32.9 g/dL (ref 30.0–36.0)
MCV: 88.9 fl (ref 78.0–100.0)
Monocytes Absolute: 0.4 10*3/uL (ref 0.1–1.0)
Monocytes Relative: 8.8 % (ref 3.0–12.0)
Neutro Abs: 2.6 10*3/uL (ref 1.4–7.7)
Neutrophils Relative %: 53.7 % (ref 43.0–77.0)
Platelets: 210 10*3/uL (ref 150.0–400.0)
RBC: 4.43 Mil/uL (ref 3.87–5.11)
RDW: 13.5 % (ref 11.5–15.5)
WBC: 4.9 10*3/uL (ref 4.0–10.5)

## 2016-11-20 LAB — COMPREHENSIVE METABOLIC PANEL
ALT: 9 U/L (ref 0–35)
AST: 12 U/L (ref 0–37)
Albumin: 4.3 g/dL (ref 3.5–5.2)
Alkaline Phosphatase: 48 U/L (ref 39–117)
BUN: 16 mg/dL (ref 6–23)
CO2: 26 mEq/L (ref 19–32)
Calcium: 9.2 mg/dL (ref 8.4–10.5)
Chloride: 103 mEq/L (ref 96–112)
Creatinine, Ser: 0.83 mg/dL (ref 0.40–1.20)
GFR: 100 mL/min (ref >60.00)
Glucose, Bld: 91 mg/dL (ref 70–99)
Potassium: 3.6 mEq/L (ref 3.5–5.1)
Sodium: 137 mEq/L (ref 135–145)
Total Bilirubin: 0.4 mg/dL (ref 0.2–1.2)
Total Protein: 7.7 g/dL (ref 6.0–8.3)

## 2016-11-20 LAB — HEMOGLOBIN A1C: Hgb A1c MFr Bld: 5.8 % (ref 4.6–6.5)

## 2016-11-20 LAB — TSH: TSH: 1.42 u[IU]/mL (ref 0.35–4.50)

## 2016-11-20 MED ORDER — ALPRAZOLAM 1 MG PO TABS: 1.0000 mg | ORAL_TABLET | Freq: Every evening | ORAL | 0 refills | Status: DC | PRN

## 2016-11-20 MED ORDER — NEBIVOLOL HCL 5 MG PO TABS: 5.0000 mg | ORAL_TABLET | Freq: Every day | ORAL | 1 refills | Status: DC

## 2016-11-20 MED ORDER — TRIAMCINOLONE ACETONIDE 0.1 % EX CREA: 1.0000 "application " | TOPICAL_CREAM | Freq: Two times a day (BID) | CUTANEOUS | 5 refills | Status: DC | PRN

## 2016-11-20 MED ORDER — ESCITALOPRAM OXALATE 10 MG PO TABS: 10.0000 mg | ORAL_TABLET | Freq: Every day | ORAL | 1 refills | Status: DC

## 2016-11-20 MED ORDER — METRONIDAZOLE 0.75 % VA GEL: Freq: Every day | VAGINAL | 0 refills | Status: AC

## 2016-11-20 MED FILL — BYSTOLIC 5 MG TABLET: 5 | 90 days supply | Qty: 90 | Fill #0

## 2016-11-20 NOTE — Progress Notes (Signed)
Subjective:    Patient ID: Candace [name], female    DOB: [birth year], 36 y.o.   MRN: [id number]  HPI The patient is here for follow up.  Hypertension: She is not taking her medication - she did not tolerate amlodipine.  She does not like hctz - it makes her incontinent.  She is compliant with a low sodium diet.  She denies chest pain, palpitations, edema, shortness of breath and regular headaches. She is exercising regularly.  She does not monitor her blood pressure at home.    Obesity:  She is taking contrave daily.  She has been having increased anxiety and has been eating more.  Before the increased anxiety she feels the medication was working.    Situational Anxiety: She is taking her medication daily as prescribed. She rarely takes xanax.  She denies any side effects from the medication. She feels her anxiety is well controlled and she is happy with her current dose of medication.   Depression: She is taking her medication daily as prescribed. She denies any side effects from the medication. She feels her depression is well controlled and she is happy with her current dose of medication.   She is losing insurance in 30 days.  She is looking for a new job.    Medications and allergies reviewed with patient and updated if appropriate.  Patient Active Problem List   Diagnosis Date Noted  . Allergic reaction 06/26/2016  . Obesity 06/26/2016  . Rash and nonspecific skin eruption 12/30/2015  . Bilateral foot pain 09/23/2015  . Low back pain 09/23/2015  . Vitamin D deficiency 08/12/2015  . Alopecia 06/11/2014  . Situational depression 11/21/2012  . Multinodular thyroid 12/01/2011  . Situational anxiety 11/26/2011  . Insomnia 11/26/2011  . Urinary urgency 11/26/2011  . Cervical polyp 11/26/2011  . Eczema 11/26/2011  . Essential hypertension, benign 11/25/2011    Current Outpatient Prescriptions on File Prior to Visit  Medication Sig Dispense Refill  . Adapalene-Benzoyl  Peroxide (EPIDUO FORTE) 0.3-2.5 % GEL Apply topically.    . ALPRAZolam (XANAX) 1 MG tablet Take 1 tablet (1 mg total) by mouth at bedtime as needed for anxiety. 30 tablet 0  . Clobetasol Prop Emollient Base (CLOBETASOL PROPIONATE E) 0.05 % emollient cream Apply 1 application topically 2 (two) times daily. 30 g 3  . DUREZOL 0.05 % EMUL   1  . escitalopram (LEXAPRO) 10 MG tablet Take 1 tablet (10 mg total) by mouth daily. 90 tablet 1  . lidocaine (XYLOCAINE) 5 % ointment   0  . metroNIDAZOLE (METROGEL) 0.75 % vaginal gel   1  . mupirocin ointment (BACTROBAN) 2 % aplly to affected area BID prn 30 g 1  . Naltrexone-Bupropion HCl ER 8-90 MG TB12 Take 2 tablets by mouth 2 (two) times daily. 120 tablet 2  . nystatin (MYCOSTATIN) powder Apply topically 2 (two) times daily as needed. 30 g 0  . amLODipine (NORVASC) 5 MG tablet Take 1 tablet (5 mg total) by mouth daily. (Patient not taking: Reported on 11/20/2016) 90 tablet 3   No current facility-administered medications on file prior to visit.     Past Medical History:  Diagnosis Date  . Anxiety    no meds  . Depression    no meds  . Hypertension    does not take BP med regularly, instructed to take med nest 3 days.    Past Surgical History:  Procedure Laterality Date  . DILATION AND CURETTAGE OF UTERUS  x 2 polyps removed  . DILATION AND EVACUATION N/A 02/10/2013   Procedure: DILATATION AND EVACUATION;  Surgeon: Zelphia CairoGretchen Adkins, MD;  Location: [facility];  Service: Gynecology;  Laterality: N/A;  with intra operative us  . SPINE SURGERY     patient denies having spine surgery  . WISDOM TOOTH EXTRACTION  03/2014    Social History   Social History  . Marital status: Single    Spouse name: n/a  . Number of children: 0  . Years of education: N/A   Occupational History  . assessment counselor     [employer]   Social History Main Topics  . Smoking status: Never Smoker  . Smokeless tobacco: Never Used  . Alcohol use 1.8 oz/week    3  Standard drinks or equivalent per week     Comment: weekends - mixed drinks  . Drug use: No  . Sexual activity: Yes    Partners: Male    Birth control/ protection: None   Other Topics Concern  . Not on file   Social History Narrative   Works in ED - behavioral assessments      Exercise:  Zumba, walking    Family History  Problem Relation Age of Onset  . Diabetes Father   . Hypertension Father   . Hypertension Sister   . Hypertension Brother   . Diabetes Maternal Grandmother   . Hypertension Maternal Grandmother     Review of Systems  Constitutional: Negative for chills and fever.  Respiratory: Positive for wheezing (mild with uri). Negative for cough and shortness of breath.   Cardiovascular: Negative for chest pain, palpitations and leg swelling.  Neurological: Positive for headaches (intermittent). Negative for light-headedness.       Objective:   Vitals:   11/20/16 1548  BP: (!) 142/96  Pulse: 89  Resp: 16  Temp: 98.9 F (37.2 C)  SpO2: 98%   Wt Readings from Last 3 Encounters:  11/20/16 193 lb (87.5 kg)  06/26/16 188 lb (85.3 kg)  12/30/15 184 lb (83.5 kg)   Body mass index is 33.13 kg/m.   Physical Exam    Constitutional: Appears well-developed and well-nourished. No distress.  HENT:  Head: Normocephalic and atraumatic.  Neck: Neck supple. No tracheal deviation present. No thyromegaly present.  No cervical lymphadenopathy Cardiovascular: Normal rate, regular rhythm and normal heart sounds.   No murmur heard. No carotid bruit .  No edema Pulmonary/Chest: Effort normal and breath sounds normal. No respiratory distress. No has no wheezes. No rales.  Skin: Skin is warm and dry. Not diaphoretic.  Psychiatric: Normal mood and affect. Behavior is normal.      Assessment & Plan:    See Problem List for Assessment and Plan of chronic medical problems.

## 2016-11-20 NOTE — Assessment & Plan Note (Addendum)
Did not tolerate amlodipine, lisinopril, losartan, hctz Metoprolol did not work  Will try [employer] Start monitoring at home - will adjust if needed Cmp, cbc, tsh

## 2016-11-20 NOTE — Patient Instructions (Addendum)
  Test(s) ordered today. Your results will be released to MyChart (or called to you) after review, usually within 72hours after test completion. If any changes need to be made, you will be notified at that same time.   Medications reviewed and updated.  Changes include starting bystolic for your BP  Your prescription(s) have been submitted to your pharmacy. Please take as directed and contact our office if you believe you are having problem(s) with the medication(s).

## 2016-11-21 NOTE — Assessment & Plan Note (Signed)
Controlled, stable Continue current dose of medication  - lexparo daily and xanax as needed

## 2016-11-21 NOTE — Assessment & Plan Note (Signed)
Controlled, stable Continue current dose of medication  

## 2016-11-21 NOTE — Assessment & Plan Note (Signed)
Check tsh 

## 2016-11-22 ENCOUNTER — Encounter: Payer: Self-pay | Admitting: Internal Medicine

## 2016-11-27 ENCOUNTER — Encounter: Payer: Self-pay | Admitting: Internal Medicine

## 2016-12-03 MED FILL — TRIAMCINOLONE ACETONIDE 0.1: 0.1 | 14 days supply | Qty: 30 | Fill #0

## 2016-12-03 MED FILL — metroNIDAZOLE 0.75 % GEL: 0.75 | 5 days supply | Qty: 70 | Fill #0

## 2016-12-11 ENCOUNTER — Ambulatory Visit (INDEPENDENT_AMBULATORY_CARE_PROVIDER_SITE_OTHER): Payer: 59 | Admitting: Pulmonary Disease

## 2016-12-11 ENCOUNTER — Encounter: Payer: Self-pay | Admitting: Pulmonary Disease

## 2016-12-11 VITALS — BP 126/82 | HR 78 | Ht 64.0 in | Wt 192.4 lb

## 2016-12-11 DIAGNOSIS — R29818 Other symptoms and signs involving the nervous system: Secondary | ICD-10-CM

## 2016-12-11 NOTE — Progress Notes (Signed)
   Subjective:    Patient ID: Candace [name], female    DOB: 03/19/1980, 36 y.o.   MRN: [id number]  HPI    Review of Systems  Constitutional: Negative for fever and unexpected weight change.  HENT: Positive for sinus pressure. Negative for congestion, dental problem, ear pain, nosebleeds, postnasal drip, rhinorrhea, sneezing, sore throat and trouble swallowing.   Eyes: Negative for redness and itching.  Respiratory: Positive for chest tightness. Negative for cough, shortness of breath and wheezing.   Cardiovascular: Positive for leg swelling. Negative for palpitations.  Gastrointestinal: Negative for nausea and vomiting.  Genitourinary: Negative for dysuria.  Musculoskeletal: Negative for joint swelling.  Skin: Negative for rash.  Allergic/Immunologic: Positive for food allergies. Negative for environmental allergies and immunocompromised state.  Neurological: Positive for headaches.  Hematological: Does not bruise/bleed easily.  Psychiatric/Behavioral: Negative for dysphoric mood. The patient is nervous/anxious.        Objective:   Physical Exam        Assessment & Plan:

## 2016-12-11 NOTE — Progress Notes (Signed)
Past Surgical History She  has a past surgical history that includes Dilation and curettage of uterus; Spine surgery; Wisdom tooth extraction (03/2014); and DILATATION AND EVACUATION (N/A, 02/10/2013).  Allergies  Allergen Reactions  . Amlodipine     Nausea, migraine    Family History Her family history includes Diabetes in her father and maternal grandmother; Hypertension in her brother, father, maternal grandmother, and sister.  Social History She  reports that  has never smoked. she has never used smokeless tobacco. She reports that she drinks about 1.8 oz of alcohol per week. She reports that she does not use drugs.  Review of systems Constitutional: Negative for fever and unexpected weight change.  HENT: Positive for sinus pressure. Negative for congestion, dental problem, ear pain, nosebleeds, postnasal drip, rhinorrhea, sneezing, sore throat and trouble swallowing.   Eyes: Negative for redness and itching.  Respiratory: Positive for chest tightness. Negative for cough, shortness of breath and wheezing.   Cardiovascular: Positive for leg swelling. Negative for palpitations.  Gastrointestinal: Negative for nausea and vomiting.  Genitourinary: Negative for dysuria.  Musculoskeletal: Negative for joint swelling.  Skin: Negative for rash.  Allergic/Immunologic: Positive for food allergies. Negative for environmental allergies and immunocompromised state.  Neurological: Positive for headaches.  Hematological: Does not bruise/bleed easily.  Psychiatric/Behavioral: Negative for dysphoric mood. Candace [name] is nervous/anxious.     Current Outpatient Medications on File Prior to Visit  Medication Sig  . Adapalene-Benzoyl Peroxide (EPIDUO FORTE) 0.3-2.5 % GEL Apply topically.  . Clobetasol Prop Emollient Base (CLOBETASOL PROPIONATE E) 0.05 % emollient cream Apply 1 application topically 2 (two) times daily.  . DUREZOL 0.05 % EMUL   . metroNIDAZOLE (METROGEL) 0.75 % vaginal gel Place  vaginally at bedtime.  . mupirocin ointment (BACTROBAN) 2 % aplly to affected area BID prn  . Naltrexone-Bupropion HCl ER 8-90 MG TB12 Take 2 tablets by mouth 2 (two) times daily.  . nebivolol (BYSTOLIC) 5 MG tablet Take 1 tablet (5 mg total) by mouth daily.  Marland Kitchen. nystatin (MYCOSTATIN) powder Apply topically 2 (two) times daily Candace needed.  . triamcinolone cream (KENALOG) 0.1 % Apply 1 application topically 2 (two) times daily Candace needed. Not more than 2 weeks at a time  . ALPRAZolam (XANAX) 1 MG tablet Take 1 tablet (1 mg total) by mouth at bedtime Candace needed for anxiety. ([name] not taking: Reported on 12/11/2016)  . escitalopram (LEXAPRO) 10 MG tablet Take 1 tablet (10 mg total) by mouth daily. ([name] not taking: Reported on 12/11/2016)   No current facility-administered medications on file prior to visit.     Chief Complaint  [name] presents with  . Sleep Consult    Pt referred by Dr. Cheryll CockayneStacy Burns MD. Pt sleeps about 2 hours at a time each day, uses restroom 1-2 each night or more. Pt has headaches in morning, daytime sleepiness more often lately. Pt is told she stops breathing and gasps for air during her sleep at night.    Past medical history She  has a past medical history of Anxiety, Depression, and Hypertension.  Vital signs BP 126/82 (BP Location: Left Arm, Cuff Size: Normal)   Pulse 78   Ht 5\' 4"  (1.626 m)   Wt 192 lb 6.4 oz (87.3 kg)   SpO2 97%   BMI 33.03 kg/m   History of Present Illness Theador Hawthorneoyka M Cromley is a 36 y.o. female for evaluation of sleep problems.  She has been told be family and friends that she snores.  She will also  stop breathing while asleep, and wake up with a gurgle.  She has trouble sleeping on her back, and can wake up while dreaming.  She gets a dry mouth at night.  She has trouble staying awake while working on a computer or on very long car rides.  She goes to sleep at midnight.  She falls asleep quickly.  She wakes up 2 or 3 times to use Candace bathroom.   She gets out of bed at 6 am on work days, and 9 am on off days.  She feels tire in Candace morning.  She denies morning headache.  She does not use anything to help her stay awake.  She tried [place] before, but this caused her to do things at night w/o her knowing.  She also tried [place] before.  She does talk in her sleep, and sometimes grinds her teeth at night.  She denies sleep walking, or nightmares.  There is no history of restless legs.  She denies sleep hallucinations, sleep paralysis, or cataplexy.  Candace Epworth score is 14 out of 24.   Physical Exam:  General - No distress Eyes - pupils reactive ENT - No sinus tenderness, no oral exudate, no LAN, no thyromegaly, TM clear, pupils equal/reactive, MP 4, enlarged tongue Cardiac - s1s2 regular, no murmur, pulses symmetric Chest - No wheeze/rales/dullness, good air entry, normal respiratory excursion Back - No focal tenderness Abd - Soft, non-tender, no organomegaly, + bowel sounds Ext - No edema Neuro - Normal strength, cranial nerves intact Skin - No rashes Psych - Normal mood, and behavior  Discussion: She has snoring, sleep disruption, witnessed apnea, and daytime sleepiness.  She has history of depression and hypertension.  I am concerned she could have sleep apnea.  We discussed how sleep apnea can affect various health problems, including risks for hypertension, cardiovascular disease, and diabetes.  We also discussed how sleep disruption can increase risks for accidents, such Candace while driving.  Weight loss Candace a means of improving sleep apnea was also reviewed.  Additional treatment options discussed were CPAP therapy, oral appliance, and surgical intervention.   Assessment/plan:  Suspected sleep apnea. - will arrange for home sleep study   [name] Instructions  Will arrange for home sleep study Will call to arrange for follow up after sleep study reviewed     Coralyn HellingVineet Verner Kopischke, M.D. Pager [phone] 12/11/2016, 10:44  AM

## 2016-12-11 NOTE — Patient Instructions (Signed)
Will arrange for home sleep study Will call to arrange for follow up after sleep study reviewed  

## 2016-12-12 ENCOUNTER — Encounter: Payer: Self-pay | Admitting: Internal Medicine

## 2016-12-15 NOTE — Telephone Encounter (Signed)
Formed faxed to number listed.

## 2017-01-01 ENCOUNTER — Encounter: Payer: 59 | Admitting: Internal Medicine

## 2017-01-07 DIAGNOSIS — G4733 Obstructive sleep apnea (adult) (pediatric): Secondary | ICD-10-CM | POA: Diagnosis not present

## 2017-01-08 ENCOUNTER — Telehealth: Payer: Self-pay | Admitting: Pulmonary Disease

## 2017-01-08 DIAGNOSIS — G4733 Obstructive sleep apnea (adult) (pediatric): Secondary | ICD-10-CM | POA: Insufficient documentation

## 2017-01-08 NOTE — Telephone Encounter (Signed)
HST 01/07/17 >> AHI 12.4, SaO2 low 76%   Will have my nurse inform pt that sleep study shows mild sleep apnea.  Options are 1) CPAP now, 2) ROV first.  If She is agreeable to CPAP, then please send order for auto CPAP range 5 to 15 cm H2O with heated humidity and mask of choice.  Have download sent 1 month after starting CPAP and set up ROV 2 months after starting CPAP.  ROV can be with me or NP.

## 2017-01-13 NOTE — Telephone Encounter (Signed)
Called and spoke with patient today regarding results per vs.  Informed the patient of her results today and recommendations per vs. The patient verbalized understanding and denied any questions or concerns at this time. Placed CPAP machine order today. Nothing further needed.

## 2017-01-14 ENCOUNTER — Other Ambulatory Visit: Payer: Self-pay | Admitting: *Deleted

## 2017-01-14 DIAGNOSIS — R29818 Other symptoms and signs involving the nervous system: Secondary | ICD-10-CM

## 2017-01-21 DIAGNOSIS — G4733 Obstructive sleep apnea (adult) (pediatric): Secondary | ICD-10-CM | POA: Diagnosis not present

## 2017-01-27 ENCOUNTER — Telehealth: Payer: Self-pay | Admitting: Pulmonary Disease

## 2017-01-27 DIAGNOSIS — G4733 Obstructive sleep apnea (adult) (pediatric): Secondary | ICD-10-CM

## 2017-01-27 NOTE — Telephone Encounter (Signed)
[facility] a follow up clinical evaluation must occure between 02-21-17 and 04-21-17.   Called and spoke with patient today, schedule appt for cpap machine compliance after recent set up on the new machine for Tuesday Mar 16, 2017 at 9am.  Patient complained the mask is smothering her at night, and it is not fitting properly.  Placed order with DME - [facility] this morning for mask fitting for the patient.

## 2017-02-12 DIAGNOSIS — H1820 Unspecified corneal edema: Secondary | ICD-10-CM | POA: Diagnosis not present

## 2017-02-12 DIAGNOSIS — H10413 Chronic giant papillary conjunctivitis, bilateral: Secondary | ICD-10-CM | POA: Diagnosis not present

## 2017-02-12 DIAGNOSIS — H04123 Dry eye syndrome of bilateral lacrimal glands: Secondary | ICD-10-CM | POA: Diagnosis not present

## 2017-02-12 DIAGNOSIS — H40013 Open angle with borderline findings, low risk, bilateral: Secondary | ICD-10-CM | POA: Diagnosis not present

## 2017-02-12 MED FILL — TOBRAMYCIN-DEXAMETH OPTH SU: 0.3-0.1 | 25 days supply | Qty: 5 | Fill #0

## 2017-02-21 DIAGNOSIS — G4733 Obstructive sleep apnea (adult) (pediatric): Secondary | ICD-10-CM | POA: Diagnosis not present

## 2017-02-22 DIAGNOSIS — H1820 Unspecified corneal edema: Secondary | ICD-10-CM | POA: Diagnosis not present

## 2017-02-26 DIAGNOSIS — N979 Female infertility, unspecified: Secondary | ICD-10-CM | POA: Diagnosis not present

## 2017-03-11 MED FILL — BYSTOLIC 5 MG TABLET: 5 | 90 days supply | Qty: 90 | Fill #1

## 2017-03-11 MED FILL — metroNIDAZOLE 0.75 % GEL: 0.75 | 5 days supply | Qty: 70 | Fill #0

## 2017-03-15 DIAGNOSIS — Z6833 Body mass index (BMI) 33.0-33.9, adult: Secondary | ICD-10-CM | POA: Diagnosis not present

## 2017-03-15 DIAGNOSIS — Z01419 Encounter for gynecological examination (general) (routine) without abnormal findings: Secondary | ICD-10-CM | POA: Diagnosis not present

## 2017-03-16 ENCOUNTER — Ambulatory Visit: Payer: 59 | Admitting: Pulmonary Disease

## 2017-03-17 DIAGNOSIS — H1089 Other conjunctivitis: Secondary | ICD-10-CM | POA: Diagnosis not present

## 2017-03-17 DIAGNOSIS — H04123 Dry eye syndrome of bilateral lacrimal glands: Secondary | ICD-10-CM | POA: Diagnosis not present

## 2017-03-17 DIAGNOSIS — H179 Unspecified corneal scar and opacity: Secondary | ICD-10-CM | POA: Diagnosis not present

## 2017-03-17 DIAGNOSIS — H40013 Open angle with borderline findings, low risk, bilateral: Secondary | ICD-10-CM | POA: Diagnosis not present

## 2017-03-17 MED FILL — LOTEMAX 0.5% GEL: 0.5 | 25 days supply | Qty: 5 | Fill #0

## 2017-03-18 DIAGNOSIS — N979 Female infertility, unspecified: Secondary | ICD-10-CM | POA: Diagnosis not present

## 2017-03-19 ENCOUNTER — Encounter: Payer: Self-pay | Admitting: Pulmonary Disease

## 2017-03-19 ENCOUNTER — Ambulatory Visit (INDEPENDENT_AMBULATORY_CARE_PROVIDER_SITE_OTHER): Payer: 59 | Admitting: Pulmonary Disease

## 2017-03-19 VITALS — BP 124/70 | HR 88 | Ht 64.0 in | Wt 197.0 lb

## 2017-03-19 DIAGNOSIS — Z9989 Dependence on other enabling machines and devices: Secondary | ICD-10-CM

## 2017-03-19 DIAGNOSIS — G4733 Obstructive sleep apnea (adult) (pediatric): Secondary | ICD-10-CM | POA: Diagnosis not present

## 2017-03-19 NOTE — Progress Notes (Signed)
[facility]  Chief Complaint  Patient presents with  . Follow-up    Pt is doing well with cpap machine, trying to still adjust to sleep with cpap.    Vital signs: BP 124/70 (BP Location: Left Arm, Cuff Size: Normal)   Pulse 88   Ht 5\' 4"  (1.626 m)   Wt 197 lb (89.4 kg)   SpO2 98%   BMI 33.81 kg/m   History of Present Illness: Candace [name] is a 37 y.o. female obstructive sleep apnea.  Her sleep study showed mild sleep apnea.  She was started on auto CPAP.  She is slowly getting used to wearing this.  She is sleeping better and feels more alert.  No issues with sinuses or dry mouth.  She has nasal pillow mask.   Physical Exam:  General - pleasant Eyes - pupils reactive ENT - no sinus tenderness, no oral exudate, no LAN, MP 3 Cardiac - regular, no murmur Chest - no wheeze, rales Abd - soft, non tender Ext - no edema Skin - no rashes Neuro - normal strength Psych - normal mood  Assessment/Plan:  Obstructive sleep apnea. - she is compliant with CPAP and reports benefit - continue auto CPAP  - discussed different cleaning options - advised her to look on line for alternate mask options   Patient Instructions  Follow up in 1 year    Candace HellingVineet Cree Napoli, MD [facility] 03/19/2017, 9:44 AM Pager:  [phone]  Flow Sheet  Sleep tests: HST 01/07/17 >> AHI 12.4, SaO2 low 76% Auto CPAP 02/16/17 to 03/17/17 >> used on 27 of 30 nights with average 4 hrs 28 min.  Average AHI 2 with median CPAP 7 and 95 th percentile 11 cm H2O  Past Medical History: She  has a past medical history of Anxiety, Depression, and Hypertension.  Past Surgical History: She  has a past surgical history that includes Dilation and curettage of uterus; Spine surgery; Dilation and evacuation (N/A, 02/10/2013); and Wisdom tooth extraction (03/2014).  Family History: Her family history includes Diabetes in her father and maternal grandmother;  Hypertension in her brother, father, maternal grandmother, and sister.  Social History: She  reports that  has never smoked. she has never used smokeless tobacco. She reports that she drinks about 1.8 oz of alcohol per week. She reports that she does not use drugs.  Medications: Allergies as of 03/19/2017      Reactions   Amlodipine    Nausea, migraine      Medication List        Accurate as of 03/19/17  9:44 AM. Always use your most recent med list.          ALPRAZolam 1 MG tablet Commonly known as:  XANAX Take 1 tablet (1 mg total) by mouth at bedtime as needed for anxiety.   Clobetasol Prop Emollient Base 0.05 % emollient cream Commonly known as:  CLOBETASOL PROPIONATE E Apply 1 application topically 2 (two) times daily.   DUREZOL 0.05 % Emul Generic drug:  Difluprednate   EPIDUO FORTE 0.3-2.5 % Gel Generic drug:  Adapalene-Benzoyl Peroxide Apply topically.   escitalopram 10 MG tablet Commonly known as:  LEXAPRO Take 1 tablet (10 mg total) by mouth daily.   loteprednol 0.5 % ophthalmic suspension Commonly known as:  LOTEMAX 4 (four) times daily.   mupirocin ointment 2 % Commonly known as:  BACTROBAN aplly to affected area BID prn   Naltrexone-Bupropion HCl ER 8-90 MG Tb12  Take 2 tablets by mouth 2 (two) times daily.   nebivolol 5 MG tablet Commonly known as:  BYSTOLIC Take 1 tablet (5 mg total) by mouth daily.   nystatin powder Commonly known as:  MYCOSTATIN/NYSTOP Apply topically 2 (two) times daily as needed.   triamcinolone cream 0.1 % Commonly known as:  KENALOG Apply 1 application topically 2 (two) times daily as needed. Not more than 2 weeks at a time

## 2017-03-19 NOTE — Patient Instructions (Signed)
Follow up in 1 year.

## 2017-03-23 ENCOUNTER — Other Ambulatory Visit (HOSPITAL_COMMUNITY): Payer: Self-pay | Admitting: Obstetrics and Gynecology

## 2017-03-23 DIAGNOSIS — R7303 Prediabetes: Secondary | ICD-10-CM | POA: Insufficient documentation

## 2017-03-23 DIAGNOSIS — Z3141 Encounter for fertility testing: Secondary | ICD-10-CM

## 2017-03-23 NOTE — Patient Instructions (Signed)
Test(s) ordered today. Your results will be released to MyChart (or called to you) after review, usually within 72hours after test completion. If any changes need to be made, you will be notified at that same time.  All other Health Maintenance issues reviewed.   All recommended immunizations and age-appropriate screenings are up-to-date or discussed.  No immunizations administered today.   Medications reviewed and updated.  Changes include  /  No changes recommended at this time.  Your prescription(s) have been submitted to your pharmacy. Please take as directed and contact our office if you believe you are having problem(s) with the medication(s).  A referral was ordered for   Please followup in    Health Maintenance, Female Adopting a healthy lifestyle and getting preventive care can go a long way to promote health and wellness. Talk with your health care provider about what schedule of regular examinations is right for you. This is a good chance for you to check in with your provider about disease prevention and staying healthy. In between checkups, there are plenty of things you can do on your own. Experts have done a lot of research about which lifestyle changes and preventive measures are most likely to keep you healthy. Ask your health care provider for more information. Weight and diet Eat a healthy diet  Be sure to include plenty of vegetables, fruits, low-fat dairy products, and lean protein.  Do not eat a lot of foods high in solid fats, added sugars, or salt.  Get regular exercise. This is one of the most important things you can do for your health. ? Most adults should exercise for at least 150 minutes each week. The exercise should increase your heart rate and make you sweat (moderate-intensity exercise). ? Most adults should also do strengthening exercises at least twice a week. This is in addition to the moderate-intensity exercise.  Maintain a healthy weight  Body  mass index (BMI) is a measurement that can be used to identify possible weight problems. It estimates body fat based on height and weight. Your health care provider can help determine your BMI and help you achieve or maintain a healthy weight.  For females 20 years of age and older: ? A BMI below 18.5 is considered underweight. ? A BMI of 18.5 to 24.9 is normal. ? A BMI of 25 to 29.9 is considered overweight. ? A BMI of 30 and above is considered obese.  Watch levels of cholesterol and blood lipids  You should start having your blood tested for lipids and cholesterol at 37 years of age, then have this test every 5 years.  You may need to have your cholesterol levels checked more often if: ? Your lipid or cholesterol levels are high. ? You are older than 37 years of age. ? You are at high risk for heart disease.  Cancer screening Lung Cancer  Lung cancer screening is recommended for adults 55-80 years old who are at high risk for lung cancer because of a history of smoking.  A yearly low-dose CT scan of the lungs is recommended for people who: ? Currently smoke. ? Have quit within the past 15 years. ? Have at least a 30-pack-year history of smoking. A pack year is smoking an average of one pack of cigarettes a day for 1 year.  Yearly screening should continue until it has been 15 years since you quit.  Yearly screening should stop if you develop a health problem that would prevent you from having   having lung cancer treatment.  Breast Cancer  Practice breast self-awareness. This means understanding how your breasts normally appear and feel.  It also means doing regular breast self-exams. Let your health care provider know about any changes, no matter how small.  If you are in your 20s or 30s, you should have a clinical breast exam (CBE) by a health care provider every 1-3 years as part of a regular health exam.  If you are 79 or older, have a CBE every year. Also consider having a  breast X-ray (mammogram) every year.  If you have a family history of breast cancer, talk to your health care provider about genetic screening.  If you are at high risk for breast cancer, talk to your health care provider about having an MRI and a mammogram every year.  Breast cancer gene (BRCA) assessment is recommended for women who have family members with BRCA-related cancers. BRCA-related cancers include: ? Breast. ? Ovarian. ? Tubal. ? Peritoneal cancers.  Results of the assessment will determine the need for genetic counseling and BRCA1 and BRCA2 testing.  Cervical Cancer Your health care provider may recommend that you be screened regularly for cancer of the pelvic organs (ovaries, uterus, and vagina). This screening involves a pelvic examination, including checking for microscopic changes to the surface of your cervix (Pap test). You may be encouraged to have this screening done every 3 years, beginning at age 66.  For women ages 31-65, health care providers may recommend pelvic exams and Pap testing every 3 years, or they may recommend the Pap and pelvic exam, combined with testing for human papilloma virus (HPV), every 5 years. Some types of HPV increase your risk of cervical cancer. Testing for HPV may also be done on women of any age with unclear Pap test results.  Other health care providers may not recommend any screening for nonpregnant women who are considered low risk for pelvic cancer and who do not have symptoms. Ask your health care provider if a screening pelvic exam is right for you.  If you have had past treatment for cervical cancer or a condition that could lead to cancer, you need Pap tests and screening for cancer for at least 20 years after your treatment. If Pap tests have been discontinued, your risk factors (such as having a new sexual partner) need to be reassessed to determine if screening should resume. Some women have medical problems that increase the chance  of getting cervical cancer. In these cases, your health care provider may recommend more frequent screening and Pap tests.  Colorectal Cancer  This type of cancer can be detected and often prevented.  Routine colorectal cancer screening usually begins at 37 years of age and continues through 37 years of age.  Your health care provider may recommend screening at an earlier age if you have risk factors for colon cancer.  Your health care provider may also recommend using home test kits to check for hidden blood in the stool.  A small camera at the end of a tube can be used to examine your colon directly (sigmoidoscopy or colonoscopy). This is done to check for the earliest forms of colorectal cancer.  Routine screening usually begins at age 78.  Direct examination of the colon should be repeated every 5-10 years through 37 years of age. However, you may need to be screened more often if early forms of precancerous polyps or small growths are found.  Skin Cancer  Check your skin from head  toe regularly.  Tell your health care provider about any new moles or changes in moles, especially if there is a change in a mole's shape or color.  Also tell your health care provider if you have a mole that is larger than the size of a pencil eraser.  Always use sunscreen. Apply sunscreen liberally and repeatedly throughout the day.  Protect yourself by wearing long sleeves, pants, a wide-brimmed hat, and sunglasses whenever you are outside.  Heart disease, diabetes, and high blood pressure  High blood pressure causes heart disease and increases the risk of stroke. High blood pressure is more likely to develop in: ? People who have blood pressure in the high end of the normal range (130-139/85-89 mm Hg). ? People who are overweight or obese. ? People who are African American.  If you are 18-39 years of age, have your blood pressure checked every 3-5 years. If you are 40 years of age or older,  have your blood pressure checked every year. You should have your blood pressure measured twice-once when you are at a hospital or clinic, and once when you are not at a hospital or clinic. Record the average of the two measurements. To check your blood pressure when you are not at a hospital or clinic, you can use: ? An automated blood pressure machine at a pharmacy. ? A home blood pressure monitor.  If you are between 55 years and 79 years old, ask your health care provider if you should take aspirin to prevent strokes.  Have regular diabetes screenings. This involves taking a blood sample to check your fasting blood sugar level. ? If you are at a normal weight and have a low risk for diabetes, have this test once every three years after 37 years of age. ? If you are overweight and have a high risk for diabetes, consider being tested at a younger age or more often. Preventing infection Hepatitis B  If you have a higher risk for hepatitis B, you should be screened for this virus. You are considered at high risk for hepatitis B if: ? You were born in a country where hepatitis B is common. Ask your health care provider which countries are considered high risk. ? Your parents were born in a high-risk country, and you have not been immunized against hepatitis B (hepatitis B vaccine). ? You have HIV or AIDS. ? You use needles to inject street drugs. ? You live with someone who has hepatitis B. ? You have had sex with someone who has hepatitis B. ? You get hemodialysis treatment. ? You take certain medicines for conditions, including cancer, organ transplantation, and autoimmune conditions.  Hepatitis C  Blood testing is recommended for: ? Everyone born from 1945 through 1965. ? Anyone with known risk factors for hepatitis C.  Sexually transmitted infections (STIs)  You should be screened for sexually transmitted infections (STIs) including gonorrhea and chlamydia if: ? You are sexually  active and are younger than 37 years of age. ? You are older than 37 years of age and your health care provider tells you that you are at risk for this type of infection. ? Your sexual activity has changed since you were last screened and you are at an increased risk for chlamydia or gonorrhea. Ask your health care provider if you are at risk.  If you do not have HIV, but are at risk, it may be recommended that you take a prescription medicine daily to prevent HIV infection.   infection. This is called pre-exposure prophylaxis (PrEP). You are considered at risk if: ? You are sexually active and do not regularly use condoms or know the HIV status of your partner(s). ? You take drugs by injection. ? You are sexually active with a partner who has HIV.  Talk with your health care provider about whether you are at high risk of being infected with HIV. If you choose to begin PrEP, you should first be tested for HIV. You should then be tested every 3 months for as long as you are taking PrEP. Pregnancy  If you are premenopausal and you may become pregnant, ask your health care provider about preconception counseling.  If you may become pregnant, take 400 to 800 micrograms (mcg) of folic acid every day.  If you want to prevent pregnancy, talk to your health care provider about birth control (contraception). Osteoporosis and menopause  Osteoporosis is a disease in which the bones lose minerals and strength with aging. This can result in serious bone fractures. Your risk for osteoporosis can be identified using a bone density scan.  If you are 56 years of age or older, or if you are at risk for osteoporosis and fractures, ask your health care provider if you should be screened.  Ask your health care provider whether you should take a calcium or vitamin D supplement to lower your risk for osteoporosis.  Menopause may have certain physical symptoms and risks.  Hormone replacement therapy may reduce some of these  symptoms and risks. Talk to your health care provider about whether hormone replacement therapy is right for you. Follow these instructions at home:  Schedule regular health, dental, and eye exams.  Stay current with your immunizations.  Do not use any tobacco products including cigarettes, chewing tobacco, or electronic cigarettes.  If you are pregnant, do not drink alcohol.  If you are breastfeeding, limit how much and how often you drink alcohol.  Limit alcohol intake to no more than 1 drink per day for nonpregnant women. One drink equals 12 ounces of beer, 5 ounces of wine, or 1 ounces of hard liquor.  Do not use street drugs.  Do not share needles.  Ask your health care provider for help if you need support or information about quitting drugs.  Tell your health care provider if you often feel depressed.  Tell your health care provider if you have ever been abused or do not feel safe at home. This information is not intended to replace advice given to you by your health care provider. Make sure you discuss any questions you have with your health care provider. Document Released: 08/04/2010 Document Revised: 06/27/2015 Document Reviewed: 10/23/2014 Elsevier Interactive Patient Education  [employer].

## 2017-03-23 NOTE — Progress Notes (Signed)
Subjective:    Patient ID: Candace [name], female    DOB: [birth year], 37 y.o.   MRN: [id number]  HPI   Medications and allergies reviewed with patient and updated if appropriate.  Patient Active Problem List   Diagnosis Date Noted  . OSA (obstructive sleep apnea) 01/08/2017  . Allergic reaction 06/26/2016  . Obesity 06/26/2016  . Rash and nonspecific skin eruption 12/30/2015  . Bilateral foot pain 09/23/2015  . Low back pain 09/23/2015  . Vitamin D deficiency 08/12/2015  . Alopecia 06/11/2014  . Situational depression 11/21/2012  . Multinodular thyroid 12/01/2011  . Situational anxiety 11/26/2011  . Insomnia 11/26/2011  . Urinary urgency 11/26/2011  . Cervical polyp 11/26/2011  . Eczema 11/26/2011  . Essential hypertension, benign 11/25/2011    Current Outpatient Medications on File Prior to Visit  Medication Sig Dispense Refill  . Adapalene-Benzoyl Peroxide (EPIDUO FORTE) 0.3-2.5 % GEL Apply topically.    . ALPRAZolam (XANAX) 1 MG tablet Take 1 tablet (1 mg total) by mouth at bedtime as needed for anxiety. 30 tablet 0  . Clobetasol Prop Emollient Base (CLOBETASOL PROPIONATE E) 0.05 % emollient cream Apply 1 application topically 2 (two) times daily. 30 g 3  . DUREZOL 0.05 % EMUL   1  . escitalopram (LEXAPRO) 10 MG tablet Take 1 tablet (10 mg total) by mouth daily. 90 tablet 1  . loteprednol (LOTEMAX) 0.5 % ophthalmic suspension 4 (four) times daily.    . mupirocin ointment (BACTROBAN) 2 % aplly to affected area BID prn 30 g 1  . Naltrexone-Bupropion HCl ER 8-90 MG TB12 Take 2 tablets by mouth 2 (two) times daily. 120 tablet 2  . nebivolol (BYSTOLIC) 5 MG tablet Take 1 tablet (5 mg total) by mouth daily. 90 tablet 1  . nystatin (MYCOSTATIN) powder Apply topically 2 (two) times daily as needed. 30 g 0  . triamcinolone cream (KENALOG) 0.1 % Apply 1 application topically 2 (two) times daily as needed. Not more than 2 weeks at a time 30 g 5   No current facility-administered  medications on file prior to visit.     Past Medical History:  Diagnosis Date  . Anxiety    no meds  . Depression    no meds  . Hypertension    does not take BP med regularly, instructed to take med nest 3 days.    Past Surgical History:  Procedure Laterality Date  . DILATION AND CURETTAGE OF UTERUS     x 2 polyps removed  . DILATION AND EVACUATION N/A 02/10/2013   Procedure: DILATATION AND EVACUATION;  Surgeon: Zelphia Cairo, MD;  Location: [facility];  Service: Gynecology;  Laterality: N/A;  with intra operative [place]  . SPINE SURGERY     patient denies having spine surgery  . WISDOM TOOTH EXTRACTION  03/2014    Social History   Socioeconomic History  . Marital status: Single    Spouse name: n/a  . Number of children: 0  . Years of education: Not on file  . Highest education level: Not on file  Social Needs  . Financial resource strain: Not on file  . Food insecurity - worry: Not on file  . Food insecurity - inability: Not on file  . Transportation needs - medical: Not on file  . Transportation needs - non-medical: Not on file  Occupational History  . Occupation: Ecologist    Comment: [employer]  Tobacco Use  . Smoking status: Never Smoker  . Smokeless  tobacco: Never Used  Substance and Sexual Activity  . Alcohol use: Yes    Alcohol/week: 1.8 oz    Types: 3 Standard drinks or equivalent per week    Comment: weekends - mixed drinks  . Drug use: No  . Sexual activity: Yes    Partners: Male    Birth control/protection: None  Other Topics Concern  . Not on file  Social History Narrative   Works in ED - behavioral assessments      Exercise:  Zumba, walking    Family History  Problem Relation Age of Onset  . Diabetes Father   . Hypertension Father   . Hypertension Sister   . Hypertension Brother   . Diabetes Maternal Grandmother   . Hypertension Maternal Grandmother     Review of Systems     Objective:  There were no vitals filed for this  visit. There were no vitals filed for this visit. There is no height or weight on file to calculate BMI.  Wt Readings from Last 3 Encounters:  03/19/17 197 lb (89.4 kg)  12/11/16 192 lb 6.4 oz (87.3 kg)  11/20/16 193 lb (87.5 kg)     Physical Exam      Assessment & Plan:          This encounter was created in error - please disregard.

## 2017-03-24 ENCOUNTER — Encounter: Payer: 59 | Admitting: Internal Medicine

## 2017-03-24 DIAGNOSIS — G4733 Obstructive sleep apnea (adult) (pediatric): Secondary | ICD-10-CM | POA: Diagnosis not present

## 2017-03-29 DIAGNOSIS — H04123 Dry eye syndrome of bilateral lacrimal glands: Secondary | ICD-10-CM | POA: Diagnosis not present

## 2017-03-29 DIAGNOSIS — H1089 Other conjunctivitis: Secondary | ICD-10-CM | POA: Diagnosis not present

## 2017-03-31 ENCOUNTER — Ambulatory Visit (HOSPITAL_COMMUNITY)
Admission: RE | Admit: 2017-03-31 | Discharge: 2017-03-31 | Disposition: A | Discharge status: Home / Self Care | Payer: 59 | Source: Ambulatory Visit | Attending: Obstetrics and Gynecology | Admitting: Obstetrics and Gynecology

## 2017-03-31 DIAGNOSIS — Z3141 Encounter for fertility testing: Secondary | ICD-10-CM | POA: Diagnosis not present

## 2017-03-31 DIAGNOSIS — N979 Female infertility, unspecified: Secondary | ICD-10-CM | POA: Diagnosis not present

## 2017-03-31 MED ORDER — IOPAMIDOL (ISOVUE-300) INJECTION 61%
30.0000 mL | Freq: Once | INTRAVENOUS | Status: AC | PRN
  Administered 2017-03-31: 6 mL

## 2017-04-01 NOTE — Patient Instructions (Addendum)
Test(s) ordered today. Your results will be released to [facility] (or called to you) after review, usually within 72hours after test completion. If any changes need to be made, you will be notified at that same time.  All other Health Maintenance issues reviewed.   All recommended immunizations and age-appropriate screenings are up-to-date or discussed.  No immunizations administered today.   Medications reviewed and updated.  No changes recommended at this time.  Your prescription(s) have been submitted to your pharmacy. Please take as directed and contact our office if you believe you are having problem(s) with the medication(s).   Please followup in 6 months   Health Maintenance, Female Adopting a healthy lifestyle and getting preventive care can go a long way to promote health and wellness. Talk with your health care provider about what schedule of regular examinations is right for you. This is a good chance for you to check in with your provider about disease prevention and staying healthy. In between checkups, there are plenty of things you can do on your own. Experts have done a lot of research about which lifestyle changes and preventive measures are most likely to keep you healthy. Ask your health care provider for more information. Weight and diet Eat a healthy diet  Be sure to include plenty of vegetables, fruits, low-fat dairy products, and lean protein.  Do not eat a lot of foods high in solid fats, added sugars, or salt.  Get regular exercise. This is one of the most important things you can do for your health. ? Most adults should exercise for at least 150 minutes each week. The exercise should increase your heart rate and make you sweat (moderate-intensity exercise). ? Most adults should also do strengthening exercises at least twice a week. This is in addition to the moderate-intensity exercise.  Maintain a healthy weight  Body mass index (BMI) is a measurement that can  be used to identify possible weight problems. It estimates body fat based on height and weight. Your health care provider can help determine your BMI and help you achieve or maintain a healthy weight.  For females 30 years of age and older: ? A BMI below 18.5 is considered underweight. ? A BMI of 18.5 to 24.9 is normal. ? A BMI of 25 to 29.9 is considered overweight. ? A BMI of 30 and above is considered obese.  Watch levels of cholesterol and blood lipids  You should start having your blood tested for lipids and cholesterol at 37 years of age, then have this test every 5 years.  You may need to have your cholesterol levels checked more often if: ? Your lipid or cholesterol levels are high. ? You are older than 37 years of age. ? You are at high risk for heart disease.  Cancer screening Lung Cancer  Lung cancer screening is recommended for adults 26-7 years old who are at high risk for lung cancer because of a history of smoking.  A yearly low-dose CT scan of the lungs is recommended for people who: ? Currently smoke. ? Have quit within the past 15 years. ? Have at least a 30-pack-year history of smoking. A pack year is smoking an average of one pack of cigarettes a day for 1 year.  Yearly screening should continue until it has been 15 years since you quit.  Yearly screening should stop if you develop a health problem that would prevent you from having lung cancer treatment.  Breast Cancer  Practice breast self-awareness.  This means understanding how your breasts normally appear and feel.  It also means doing regular breast self-exams. Let your health care provider know about any changes, no matter how small.  If you are in your 20s or 30s, you should have a clinical breast exam (CBE) by a health care provider every 1-3 years as part of a regular health exam.  If you are 29 or older, have a CBE every year. Also consider having a breast X-ray (mammogram) every year.  If you  have a family history of breast cancer, talk to your health care provider about genetic screening.  If you are at high risk for breast cancer, talk to your health care provider about having an MRI and a mammogram every year.  Breast cancer gene (BRCA) assessment is recommended for women who have family members with BRCA-related cancers. BRCA-related cancers include: ? Breast. ? Ovarian. ? Tubal. ? Peritoneal cancers.  Results of the assessment will determine the need for genetic counseling and BRCA1 and BRCA2 testing.  Cervical Cancer Your health care provider may recommend that you be screened regularly for cancer of the pelvic organs (ovaries, uterus, and vagina). This screening involves a pelvic examination, including checking for microscopic changes to the surface of your cervix (Pap test). You may be encouraged to have this screening done every 3 years, beginning at age 40.  For women ages 44-65, health care providers may recommend pelvic exams and Pap testing every 3 years, or they may recommend the Pap and pelvic exam, combined with testing for human papilloma virus (HPV), every 5 years. Some types of HPV increase your risk of cervical cancer. Testing for HPV may also be done on women of any age with unclear Pap test results.  Other health care providers may not recommend any screening for nonpregnant women who are considered low risk for pelvic cancer and who do not have symptoms. Ask your health care provider if a screening pelvic exam is right for you.  If you have had past treatment for cervical cancer or a condition that could lead to cancer, you need Pap tests and screening for cancer for at least 20 years after your treatment. If Pap tests have been discontinued, your risk factors (such as having a new sexual partner) need to be reassessed to determine if screening should resume. Some women have medical problems that increase the chance of getting cervical cancer. In these cases,  your health care provider may recommend more frequent screening and Pap tests.  Colorectal Cancer  This type of cancer can be detected and often prevented.  Routine colorectal cancer screening usually begins at 37 years of age and continues through 37 years of age.  Your health care provider may recommend screening at an earlier age if you have risk factors for colon cancer.  Your health care provider may also recommend using home test kits to check for hidden blood in the stool.  A small camera at the end of a tube can be used to examine your colon directly (sigmoidoscopy or colonoscopy). This is done to check for the earliest forms of colorectal cancer.  Routine screening usually begins at age 96.  Direct examination of the colon should be repeated every 5-10 years through 37 years of age. However, you may need to be screened more often if early forms of precancerous polyps or small growths are found.  Skin Cancer  Check your skin from head to toe regularly.  Tell your health care provider about any  new moles or changes in moles, especially if there is a change in a mole's shape or color.  Also tell your health care provider if you have a mole that is larger than the size of a pencil eraser.  Always use sunscreen. Apply sunscreen liberally and repeatedly throughout the day.  Protect yourself by wearing long sleeves, pants, a wide-brimmed hat, and sunglasses whenever you are outside.  Heart disease, diabetes, and high blood pressure  High blood pressure causes heart disease and increases the risk of stroke. High blood pressure is more likely to develop in: ? People who have blood pressure in the high end of the normal range (130-139/85-89 mm Hg). ? People who are overweight or obese. ? People who are African American.  If you are 75-46 years of age, have your blood pressure checked every 3-5 years. If you are 86 years of age or older, have your blood pressure checked every year.  You should have your blood pressure measured twice-once when you are at a hospital or clinic, and once when you are not at a hospital or clinic. Record the average of the two measurements. To check your blood pressure when you are not at a hospital or clinic, you can use: ? An automated blood pressure machine at a pharmacy. ? A home blood pressure monitor.  If you are between 61 years and 83 years old, ask your health care provider if you should take aspirin to prevent strokes.  Have regular diabetes screenings. This involves taking a blood sample to check your fasting blood sugar level. ? If you are at a normal weight and have a low risk for diabetes, have this test once every three years after 37 years of age. ? If you are overweight and have a high risk for diabetes, consider being tested at a younger age or more often. Preventing infection Hepatitis B  If you have a higher risk for hepatitis B, you should be screened for this virus. You are considered at high risk for hepatitis B if: ? You were born in a country where hepatitis B is common. Ask your health care provider which countries are considered high risk. ? Your parents were born in a high-risk country, and you have not been immunized against hepatitis B (hepatitis B vaccine). ? You have HIV or AIDS. ? You use needles to inject street drugs. ? You live with someone who has hepatitis B. ? You have had sex with someone who has hepatitis B. ? You get hemodialysis treatment. ? You take certain medicines for conditions, including cancer, organ transplantation, and autoimmune conditions.  Hepatitis C  Blood testing is recommended for: ? Everyone born from [birth year] through 1965. ? Anyone with known risk factors for hepatitis C.  Sexually transmitted infections (STIs)  You should be screened for sexually transmitted infections (STIs) including gonorrhea and chlamydia if: ? You are sexually active and are younger than 37 years of  age. ? You are older than 37 years of age and your health care provider tells you that you are at risk for this type of infection. ? Your sexual activity has changed since you were last screened and you are at an increased risk for chlamydia or gonorrhea. Ask your health care provider if you are at risk.  If you do not have HIV, but are at risk, it may be recommended that you take a prescription medicine daily to prevent HIV infection. This is called pre-exposure prophylaxis (PrEP). You are considered at  risk if: ? You are sexually active and do not regularly use condoms or know the HIV status of your partner(s). ? You take drugs by injection. ? You are sexually active with a partner who has HIV.  Talk with your health care provider about whether you are at high risk of being infected with HIV. If you choose to begin PrEP, you should first be tested for HIV. You should then be tested every 3 months for as long as you are taking PrEP. Pregnancy  If you are premenopausal and you may become pregnant, ask your health care provider about preconception counseling.  If you may become pregnant, take 400 to 800 micrograms (mcg) of folic acid every day.  If you want to prevent pregnancy, talk to your health care provider about birth control (contraception). Osteoporosis and menopause  Osteoporosis is a disease in which the bones lose minerals and strength with aging. This can result in serious bone fractures. Your risk for osteoporosis can be identified using a bone density scan.  If you are 6 years of age or older, or if you are at risk for osteoporosis and fractures, ask your health care provider if you should be screened.  Ask your health care provider whether you should take a calcium or vitamin D supplement to lower your risk for osteoporosis.  Menopause may have certain physical symptoms and risks.  Hormone replacement therapy may reduce some of these symptoms and risks. Talk to your health  care provider about whether hormone replacement therapy is right for you. Follow these instructions at home:  Schedule regular health, dental, and eye exams.  Stay current with your immunizations.  Do not use any tobacco products including cigarettes, chewing tobacco, or electronic cigarettes.  If you are pregnant, do not drink alcohol.  If you are breastfeeding, limit how much and how often you drink alcohol.  Limit alcohol intake to no more than 1 drink per day for nonpregnant women. One drink equals 12 ounces of beer, 5 ounces of wine, or 1 ounces of hard liquor.  Do not use street drugs.  Do not share needles.  Ask your health care provider for help if you need support or information about quitting drugs.  Tell your health care provider if you often feel depressed.  Tell your health care provider if you have ever been abused or do not feel safe at home. This information is not intended to replace advice given to you by your health care provider. Make sure you discuss any questions you have with your health care provider. Document Released: 08/04/2010 Document Revised: 06/27/2015 Document Reviewed: 10/23/2014 Elsevier Interactive Patient Education  [employer].

## 2017-04-01 NOTE — Progress Notes (Signed)
Subjective:    Patient ID: Candace [name], female    DOB: [birth year], 37 y.o.   MRN: [id number]  HPI She is here for a physical exam.   She has no real concerns. She feels like she is eating well, but not currently exercising.  She knows she needs to exercise to lose weight.  She is interested in trying the contrave again - she did take this briefly but then lost insurance.    Medications and allergies reviewed with patient and updated if appropriate.  Patient Active Problem List   Diagnosis Date Noted  . Prediabetes 03/23/2017  . OSA (obstructive sleep apnea) 01/08/2017  . Allergic reaction 06/26/2016  . Obesity 06/26/2016  . Rash and nonspecific skin eruption 12/30/2015  . Bilateral foot pain 09/23/2015  . Low back pain 09/23/2015  . Vitamin D deficiency 08/12/2015  . Alopecia 06/11/2014  . Situational depression 11/21/2012  . Multinodular thyroid 12/01/2011  . Situational anxiety 11/26/2011  . Insomnia 11/26/2011  . Cervical polyp 11/26/2011  . Eczema 11/26/2011  . Essential hypertension, benign 11/25/2011    Current Outpatient Medications on File Prior to Visit  Medication Sig Dispense Refill  . Adapalene-Benzoyl Peroxide (EPIDUO FORTE) 0.3-2.5 % GEL Apply topically.    . ALPRAZolam (XANAX) 1 MG tablet Take 1 tablet (1 mg total) by mouth at bedtime as needed for anxiety. 30 tablet 0  . Clobetasol Prop Emollient Base (CLOBETASOL PROPIONATE E) 0.05 % emollient cream Apply 1 application topically 2 (two) times daily. 30 g 3  . DUREZOL 0.05 % EMUL   1  . escitalopram (LEXAPRO) 10 MG tablet Take 1 tablet (10 mg total) by mouth daily. 90 tablet 1  . loteprednol (LOTEMAX) 0.5 % ophthalmic suspension 4 (four) times daily.    . mupirocin ointment (BACTROBAN) 2 % aplly to affected area BID prn 30 g 1  . Naltrexone-Bupropion HCl ER 8-90 MG TB12 Take 2 tablets by mouth 2 (two) times daily. 120 tablet 2  . nebivolol (BYSTOLIC) 5 MG tablet Take 1 tablet (5 mg total) by mouth daily.  90 tablet 1  . nystatin (MYCOSTATIN) powder Apply topically 2 (two) times daily as needed. 30 g 0  . triamcinolone cream (KENALOG) 0.1 % Apply 1 application topically 2 (two) times daily as needed. Not more than 2 weeks at a time 30 g 5   No current facility-administered medications on file prior to visit.     Past Medical History:  Diagnosis Date  . Anxiety    no meds  . Depression    no meds  . Hypertension    does not take BP med regularly, instructed to take med nest 3 days.    Past Surgical History:  Procedure Laterality Date  . DILATION AND CURETTAGE OF UTERUS     x 2 polyps removed  . DILATION AND EVACUATION N/A 02/10/2013   Procedure: DILATATION AND EVACUATION;  Surgeon: Zelphia Cairo, MD;  Location: [facility];  Service: Gynecology;  Laterality: N/A;  with intra operative [place]  . SPINE SURGERY     patient denies having spine surgery  . WISDOM TOOTH EXTRACTION  03/2014    Social History   Socioeconomic History  . Marital status: Single    Spouse name: n/a  . Number of children: 0  . Years of education: None  . Highest education level: None  Social Needs  . Financial resource strain: None  . Food insecurity - worry: None  . Food insecurity - inability: None  .  Transportation needs - medical: None  . Transportation needs - non-medical: None  Occupational History  . Occupation: Ecologist    Comment:   Tobacco Use  . Smoking status: Never Smoker  . Smokeless tobacco: Never Used  Substance and Sexual Activity  . Alcohol use: Yes    Alcohol/week: 1.8 oz    Types: 3 Standard drinks or equivalent per week    Comment: weekends - mixed drinks  . Drug use: No  . Sexual activity: Yes    Partners: Male    Birth control/protection: None  Other Topics Concern  . None  Social History Narrative   Works in ED - behavioral assessments      Exercise:  Zumba, walking    Family History  Problem Relation Age of Onset  . Diabetes Father   .  Hypertension Father   . Hypertension Sister   . Hypertension Brother   . Diabetes Maternal Grandmother   . Hypertension Maternal Grandmother     Review of Systems  Constitutional: Negative for chills and fever.  HENT: Negative for congestion and sinus pain.   Respiratory: Negative for cough, shortness of breath and wheezing.   Cardiovascular: Positive for leg swelling (occ, salt intake). Negative for chest pain and palpitations.  Gastrointestinal: Negative for abdominal pain, blood in stool, constipation, diarrhea and nausea.       No gerd  Genitourinary: Negative for dysuria and hematuria.  Musculoskeletal: Negative for arthralgias and back pain.  Skin: Negative for color change and rash.       acne  Neurological: Negative for light-headedness and headaches.  Psychiatric/Behavioral: Positive for dysphoric mood. The patient is nervous/anxious.        Objective:   Vitals:   04/02/17 0819  BP: 124/86  Pulse: 85  Resp: 16  Temp: 98.5 F (36.9 C)  SpO2: 98%   Filed Weights   04/02/17 0819  Weight: 198 lb (89.8 kg)   Body mass index is 33.99 kg/m.  Wt Readings from Last 3 Encounters:  04/02/17 198 lb (89.8 kg)  03/19/17 197 lb (89.4 kg)  12/11/16 192 lb 6.4 oz (87.3 kg)     Physical Exam Constitutional: She appears well-developed and well-nourished. No distress.  HENT:  Head: Normocephalic and atraumatic.  Right Ear: External ear normal. Normal ear canal and TM Left Ear: External ear normal.  Normal ear canal and TM Mouth/Throat: Oropharynx is clear and moist.  Eyes: Conjunctivae and EOM are normal.  Neck: Neck supple. No tracheal deviation present. No thyromegaly present.  No carotid bruit  Cardiovascular: Normal rate, regular rhythm and normal heart sounds.   No murmur heard.  No edema. Pulmonary/Chest: Effort normal and breath sounds normal. No respiratory distress. She has no wheezes. She has no rales.  Breast: deferred to Gyn Abdominal: Soft. She exhibits  no distension. There is no tenderness.  Lymphadenopathy: She has no cervical adenopathy.  Skin: Skin is warm and dry. She is not diaphoretic.  Psychiatric: She has a normal mood and affect. Her behavior is normal.        Assessment & Plan:   Physical exam: Screening blood work ordered Immunizations   up-to-date Gyn  Up to date - last seen 2 weeks ago Exercise  None- stressed regular exercise Weight  Will work on weight loss - will try contrave again, decrease portions and start regular exercise Skin   None except acne Substance abuse   none  See Problem List for Assessment and Plan of chronic medical  problems.   FU in 6 months

## 2017-04-02 ENCOUNTER — Encounter: Payer: Self-pay | Admitting: Internal Medicine

## 2017-04-02 ENCOUNTER — Telehealth: Payer: Self-pay | Admitting: Emergency Medicine

## 2017-04-02 ENCOUNTER — Other Ambulatory Visit (INDEPENDENT_AMBULATORY_CARE_PROVIDER_SITE_OTHER): Payer: 59

## 2017-04-02 ENCOUNTER — Ambulatory Visit (INDEPENDENT_AMBULATORY_CARE_PROVIDER_SITE_OTHER): Payer: 59 | Admitting: Internal Medicine

## 2017-04-02 VITALS — BP 124/86 | HR 85 | Temp 98.5°F | Resp 16 | Ht 64.0 in | Wt 198.0 lb

## 2017-04-02 DIAGNOSIS — G4700 Insomnia, unspecified: Secondary | ICD-10-CM

## 2017-04-02 DIAGNOSIS — E66811 Obesity, class 1: Secondary | ICD-10-CM

## 2017-04-02 DIAGNOSIS — F418 Other specified anxiety disorders: Secondary | ICD-10-CM | POA: Diagnosis not present

## 2017-04-02 DIAGNOSIS — Z0000 Encounter for general adult medical examination without abnormal findings: Secondary | ICD-10-CM

## 2017-04-02 DIAGNOSIS — R7303 Prediabetes: Secondary | ICD-10-CM

## 2017-04-02 DIAGNOSIS — E6609 Other obesity due to excess calories: Secondary | ICD-10-CM | POA: Diagnosis not present

## 2017-04-02 DIAGNOSIS — F4321 Adjustment disorder with depressed mood: Secondary | ICD-10-CM | POA: Diagnosis not present

## 2017-04-02 DIAGNOSIS — G4733 Obstructive sleep apnea (adult) (pediatric): Secondary | ICD-10-CM | POA: Diagnosis not present

## 2017-04-02 DIAGNOSIS — Z6832 Body mass index (BMI) 32.0-32.9, adult: Secondary | ICD-10-CM | POA: Diagnosis not present

## 2017-04-02 DIAGNOSIS — E042 Nontoxic multinodular goiter: Secondary | ICD-10-CM | POA: Diagnosis not present

## 2017-04-02 DIAGNOSIS — I10 Essential (primary) hypertension: Secondary | ICD-10-CM

## 2017-04-02 LAB — LIPID PANEL
Cholesterol: 178 mg/dL (ref 0–200)
HDL: 46.9 mg/dL (ref >39.00)
LDL Cholesterol: 117 mg/dL — ABNORMAL HIGH (ref 0–99)
NonHDL: 131.07
Total CHOL/HDL Ratio: 4
Triglycerides: 69 mg/dL (ref 0.0–149.0)
VLDL: 13.8 mg/dL (ref 0.0–40.0)

## 2017-04-02 LAB — COMPREHENSIVE METABOLIC PANEL
ALT: 8 U/L (ref 0–35)
AST: 9 U/L (ref 0–37)
Albumin: 3.8 g/dL (ref 3.5–5.2)
Alkaline Phosphatase: 45 U/L (ref 39–117)
BUN: 20 mg/dL (ref 6–23)
CO2: 27 mEq/L (ref 19–32)
Calcium: 9.1 mg/dL (ref 8.4–10.5)
Chloride: 109 mEq/L (ref 96–112)
Creatinine, Ser: 0.94 mg/dL (ref 0.40–1.20)
GFR: 86.44 mL/min (ref >60.00)
Glucose, Bld: 99 mg/dL (ref 70–99)
Potassium: 3.8 mEq/L (ref 3.5–5.1)
Sodium: 142 mEq/L (ref 135–145)
Total Bilirubin: 0.3 mg/dL (ref 0.2–1.2)
Total Protein: 7.3 g/dL (ref 6.0–8.3)

## 2017-04-02 LAB — CBC WITH DIFFERENTIAL/PLATELET
Basophils Absolute: 0 10*3/uL (ref 0.0–0.1)
Basophils Relative: 1.1 % (ref 0.0–3.0)
Eosinophils Absolute: 0.4 10*3/uL (ref 0.0–0.7)
Eosinophils Relative: 9.5 % — ABNORMAL HIGH (ref 0.0–5.0)
HCT: 36.7 % (ref 36.0–46.0)
Hemoglobin: 12.3 g/dL (ref 12.0–15.0)
Lymphocytes Relative: 37.5 % (ref 12.0–46.0)
Lymphs Abs: 1.6 10*3/uL (ref 0.7–4.0)
MCHC: 33.5 g/dL (ref 30.0–36.0)
MCV: 88.3 fl (ref 78.0–100.0)
Monocytes Absolute: 0.3 10*3/uL (ref 0.1–1.0)
Monocytes Relative: 7.6 % (ref 3.0–12.0)
Neutro Abs: 1.9 10*3/uL (ref 1.4–7.7)
Neutrophils Relative %: 44.3 % (ref 43.0–77.0)
Platelets: 197 10*3/uL (ref 150.0–400.0)
RBC: 4.16 Mil/uL (ref 3.87–5.11)
RDW: 13.5 % (ref 11.5–15.5)
WBC: 4.2 10*3/uL (ref 4.0–10.5)

## 2017-04-02 LAB — TSH: TSH: 2.36 u[IU]/mL (ref 0.35–4.50)

## 2017-04-02 LAB — HEMOGLOBIN A1C: Hgb A1c MFr Bld: 5.8 % (ref 4.6–6.5)

## 2017-04-02 MED ORDER — NEBIVOLOL HCL 5 MG PO TABS: 5.0000 mg | ORAL_TABLET | Freq: Every day | ORAL | 1 refills | Status: DC

## 2017-04-02 MED ORDER — TRIAMCINOLONE ACETONIDE 0.1 % EX CREA: 1.0000 "application " | TOPICAL_CREAM | Freq: Two times a day (BID) | CUTANEOUS | 5 refills | Status: DC | PRN

## 2017-04-02 MED ORDER — NALTREXONE-BUPROPION HCL ER 8-90 MG PO TB12: ORAL_TABLET | ORAL | 2 refills | Status: DC

## 2017-04-02 MED FILL — TRIAMCINOLONE 0.1% CREAM: 0.1 | 14 days supply | Qty: 30 | Fill #0

## 2017-04-02 NOTE — Telephone Encounter (Signed)
PA completed for Contrave, awaiting response. [id number]

## 2017-04-02 NOTE — Assessment & Plan Note (Signed)
Denies significant depression - any depression she feels is mild and she feels she can work through it No longer taking lexapro

## 2017-04-02 NOTE — Assessment & Plan Note (Signed)
BP well controlled Current regimen effective and well tolerated Continue current medications at current doses cmp  If she decides to try to get pregnant we will change her BP medication to labetalol - she knows to advise me when she starts trying

## 2017-04-02 NOTE — Assessment & Plan Note (Signed)
Using cpap nightly 

## 2017-04-02 NOTE — Assessment & Plan Note (Signed)
Check tsh 

## 2017-04-02 NOTE — Assessment & Plan Note (Addendum)
Eating healthy Has had difficulty losing weight Decreased portions Start regular exercise Retry contrave

## 2017-04-02 NOTE — Assessment & Plan Note (Signed)
Compliant with diabetic exercise Not exercising Will check a1c

## 2017-04-02 NOTE — Assessment & Plan Note (Signed)
Denies significant anxiety- any anxiety she has is mild and she feels she can work through it No longer taking lexapro

## 2017-04-02 NOTE — Assessment & Plan Note (Signed)
Uses xanax rarely - twice in past 6 months Will continue, but need to use it rarely Advised she can not take this if pregnant

## 2017-04-03 ENCOUNTER — Encounter: Payer: Self-pay | Admitting: Internal Medicine

## 2017-04-05 NOTE — Telephone Encounter (Signed)
Pt insurance will not cover Contrave, please advise on Alternative.

## 2017-04-05 NOTE — Telephone Encounter (Signed)
Have her call her insurance and see what is covered if she wants to try something else

## 2017-04-06 NOTE — Telephone Encounter (Signed)
LVM informing pt to contact insurance and let me know what is covered.

## 2017-04-08 MED FILL — PREDNISOLONE AC 1% EYE DROP: 1 | 25 days supply | Qty: 5 | Fill #0

## 2017-04-21 DIAGNOSIS — H04123 Dry eye syndrome of bilateral lacrimal glands: Secondary | ICD-10-CM | POA: Diagnosis not present

## 2017-04-21 DIAGNOSIS — H1089 Other conjunctivitis: Secondary | ICD-10-CM | POA: Diagnosis not present

## 2017-04-21 DIAGNOSIS — G4733 Obstructive sleep apnea (adult) (pediatric): Secondary | ICD-10-CM | POA: Diagnosis not present

## 2017-04-27 DIAGNOSIS — G4733 Obstructive sleep apnea (adult) (pediatric): Secondary | ICD-10-CM | POA: Diagnosis not present

## 2017-05-10 ENCOUNTER — Encounter: Payer: Self-pay | Admitting: Internal Medicine

## 2017-05-10 MED ORDER — NALTREXONE-BUPROPION HCL ER 8-90 MG PO TB12: ORAL_TABLET | ORAL | 2 refills | Status: DC

## 2017-05-11 ENCOUNTER — Telehealth: Payer: Self-pay | Admitting: Emergency Medicine

## 2017-05-11 NOTE — Telephone Encounter (Signed)
PA completed. Awaiting response. [place] [id number]

## 2017-05-17 DIAGNOSIS — F4322 Adjustment disorder with anxiety: Secondary | ICD-10-CM | POA: Diagnosis not present

## 2017-05-20 NOTE — Telephone Encounter (Signed)
Spoke with [employer] to check status of PA, pt does not have RX coverage through [employer]. Contacted pharmacy, contacted [employer] and started new PA. Awaiting response.

## 2017-05-22 ENCOUNTER — Encounter: Payer: Self-pay | Admitting: Internal Medicine

## 2017-05-22 DIAGNOSIS — G4733 Obstructive sleep apnea (adult) (pediatric): Secondary | ICD-10-CM | POA: Diagnosis not present

## 2017-05-28 ENCOUNTER — Encounter: Payer: Self-pay | Admitting: Internal Medicine

## 2017-05-31 NOTE — Telephone Encounter (Signed)
Spoke with pharmacy. Advised PA has been approved and only approved for 70 tablets for first fill.

## 2017-06-02 ENCOUNTER — Encounter: Payer: Self-pay | Admitting: Emergency Medicine

## 2017-06-04 DIAGNOSIS — H1089 Other conjunctivitis: Secondary | ICD-10-CM | POA: Diagnosis not present

## 2017-06-04 DIAGNOSIS — H04123 Dry eye syndrome of bilateral lacrimal glands: Secondary | ICD-10-CM | POA: Diagnosis not present

## 2017-06-10 ENCOUNTER — Encounter: Payer: Self-pay | Admitting: Internal Medicine

## 2017-06-14 MED FILL — CONTRAVE ER 8-90 MG TABLET: 8-90 | 23 days supply | Qty: 70 | Fill #0

## 2017-06-14 MED FILL — metroNIDAZOLE 0.75 % GEL: 0.75 | 5 days supply | Qty: 70 | Fill #1

## 2017-06-17 DIAGNOSIS — F4322 Adjustment disorder with anxiety: Secondary | ICD-10-CM | POA: Diagnosis not present

## 2017-06-21 DIAGNOSIS — G4733 Obstructive sleep apnea (adult) (pediatric): Secondary | ICD-10-CM | POA: Diagnosis not present

## 2017-06-23 DIAGNOSIS — F4322 Adjustment disorder with anxiety: Secondary | ICD-10-CM | POA: Diagnosis not present

## 2017-07-22 DIAGNOSIS — G4733 Obstructive sleep apnea (adult) (pediatric): Secondary | ICD-10-CM | POA: Diagnosis not present

## 2017-08-11 DIAGNOSIS — F4322 Adjustment disorder with anxiety: Secondary | ICD-10-CM | POA: Diagnosis not present

## 2017-08-21 DIAGNOSIS — G4733 Obstructive sleep apnea (adult) (pediatric): Secondary | ICD-10-CM | POA: Diagnosis not present

## 2017-09-10 DIAGNOSIS — H04123 Dry eye syndrome of bilateral lacrimal glands: Secondary | ICD-10-CM | POA: Diagnosis not present

## 2017-10-04 NOTE — Patient Instructions (Addendum)
  Test(s) ordered today. Your results will be released to MyChart (or called to you) after review, usually within 72hours after test completion. If any changes need to be made, you will be notified at that same time.  Flu immunization administered today.    Medications reviewed and updated.  No changes recommended at this time.    Please followup in 6months   

## 2017-10-04 NOTE — Progress Notes (Signed)
Subjective:    Patient ID: Candace [name], female    DOB: February 13, 1980, 37 y.o.   MRN: [id number]  HPI The patient is here for follow up.  Hypertension: She is taking her medication daily. She is compliant with a low sodium diet.  Her legs were swollen for a few weeks, but are better now.  she has been having morning headaches, but knows it is from not using her cpap - she will start using it nightly.  She denies chest pain, palpitations, shortness of breath. She is not exercising regularly.  She does not monitor her blood pressure at home.    Prediabetes:  She is more compliant with a low sugar/carbohydrate diet.  She is not exercising regularly.  She has lost weight.    Anxiety: She has xanax to use as needed, but has not taken it.  She is not on any other medication for anxiety.  She does have increased stress from work.  She wants to work in a different area and full time at one job - she currently has more than one job.  She does feel like she is doing ok with her stress at this time.      Obesity:  She was taking contrave for weight loss.  She is not taking it now - she just stopped a while ago.  She is not currently exercising, but plans on starting to walk again.  She is eating more fish and veges.  She is trying to avoid carbs.  She has cut out alcohol.  She has taken Mag 07 for the past week and has lost 10 lbs.  She wonders if she can continue to take this.  She is thinking about restarting the contrave.  .    Medications and allergies reviewed with patient and updated if appropriate.  Patient Active Problem List   Diagnosis Date Noted  . Prediabetes 03/23/2017  . OSA (obstructive sleep apnea) 01/08/2017  . Obesity 06/26/2016  . Low back pain 09/23/2015  . Vitamin D deficiency 08/12/2015  . Alopecia 06/11/2014  . Situational depression 11/21/2012  . Multinodular thyroid 12/01/2011  . Situational anxiety 11/26/2011  . Insomnia 11/26/2011  . Cervical polyp 11/26/2011  . Eczema  11/26/2011  . Essential hypertension, benign 11/25/2011    Current Outpatient Medications on File Prior to Visit  Medication Sig Dispense Refill  . ALPRAZolam (XANAX) 1 MG tablet Take 1 tablet (1 mg total) by mouth at bedtime as needed for anxiety. 30 tablet 0  . DUREZOL 0.05 % EMUL   1  . nebivolol (BYSTOLIC) 5 MG tablet Take 1 tablet (5 mg total) by mouth daily. 90 tablet 1  . NON FORMULARY MAG 07    . triamcinolone cream (KENALOG) 0.1 % Apply 1 application topically 2 (two) times daily as needed. Not more than 2 weeks at a time 30 g 5   No current facility-administered medications on file prior to visit.     Past Medical History:  Diagnosis Date  . Anxiety    no meds  . Depression    no meds  . Hypertension    does not take BP med regularly, instructed to take med nest 3 days.    Past Surgical History:  Procedure Laterality Date  . DILATION AND CURETTAGE OF UTERUS     x 2 polyps removed  . DILATION AND EVACUATION N/A 02/10/2013   Procedure: DILATATION AND EVACUATION;  Surgeon: Zelphia Cairo, MD;  Location: [facility];  Service:  Gynecology;  Laterality: N/A;  with intra operative [place]  . SPINE SURGERY     patient denies having spine surgery  . WISDOM TOOTH EXTRACTION  03/2014    Social History   Socioeconomic History  . Marital status: Single    Spouse name: n/a  . Number of children: 0  . Years of education: Not on file  . Highest education level: Not on file  Occupational History  . Occupation: Ecologist    Comment: [place]  Social Needs  . Financial resource strain: Not on file  . Food insecurity:    Worry: Not on file    Inability: Not on file  . Transportation needs:    Medical: Not on file    Non-medical: Not on file  Tobacco Use  . Smoking status: Never Smoker  . Smokeless tobacco: Never Used  Substance and Sexual Activity  . Alcohol use: Yes    Alcohol/week: 3.0 standard drinks    Types: 3 Standard drinks or equivalent per week     Comment: weekends - mixed drinks  . Drug use: No  . Sexual activity: Yes    Partners: Male    Birth control/protection: None  Lifestyle  . Physical activity:    Days per week: Not on file    Minutes per session: Not on file  . Stress: Not on file  Relationships  . Social connections:    Talks on phone: Not on file    Gets together: Not on file    Attends religious service: Not on file    Active member of club or organization: Not on file    Attends meetings of clubs or organizations: Not on file    Relationship status: Not on file  Other Topics Concern  . Not on file  Social History Narrative   Works in ED - behavioral assessments      Exercise:  none    Family History  Problem Relation Age of Onset  . Diabetes Father   . Hypertension Father   . Hypertension Sister   . Hypertension Brother   . Diabetes Maternal Grandmother   . Hypertension Maternal Grandmother     Review of Systems  Constitutional: Negative for fever.  Respiratory: Negative for cough, shortness of breath and wheezing.   Cardiovascular: Positive for leg swelling (was swollen for weeks - improved). Negative for chest pain and palpitations.  Neurological: Positive for headaches (in morning - has not been using cpap). Negative for light-headedness.       Objective:   Vitals:   10/06/17 0823  BP: 132/86  Pulse: 78  Resp: 16  Temp: 99 F (37.2 C)  SpO2: 98%   BP Readings from Last 3 Encounters:  10/06/17 132/86  04/02/17 124/86  03/19/17 124/70   Wt Readings from Last 3 Encounters:  10/06/17 194 lb (88 kg)  04/02/17 198 lb (89.8 kg)  03/19/17 197 lb (89.4 kg)   Body mass index is 33.3 kg/m.   Physical Exam    Constitutional: Appears well-developed and well-nourished. No distress.  HENT:  Head: Normocephalic and atraumatic.  Neck: Neck supple. No tracheal deviation present. No thyromegaly present.  No cervical lymphadenopathy Cardiovascular: Normal rate, regular rhythm and normal  heart sounds.   No murmur heard. No carotid bruit .  No edema Pulmonary/Chest: Effort normal and breath sounds normal. No respiratory distress. No has no wheezes. No rales.  Skin: Skin is warm and dry. Not diaphoretic.  Psychiatric: Normal mood and affect. Behavior  is normal.      Assessment & Plan:    She is traveling later this month and would like to take an antibiotic and anti-fungal with her, just in case.  Azithro and diflucan prescribed.       See Problem List for Assessment and Plan of chronic medical problems.

## 2017-10-06 ENCOUNTER — Encounter: Payer: Self-pay | Admitting: Internal Medicine

## 2017-10-06 ENCOUNTER — Other Ambulatory Visit (INDEPENDENT_AMBULATORY_CARE_PROVIDER_SITE_OTHER): Payer: 59

## 2017-10-06 ENCOUNTER — Ambulatory Visit: Payer: 59 | Admitting: Internal Medicine

## 2017-10-06 VITALS — BP 132/86 | HR 78 | Temp 99.0°F | Resp 16 | Ht 64.0 in | Wt 194.0 lb

## 2017-10-06 DIAGNOSIS — F418 Other specified anxiety disorders: Secondary | ICD-10-CM | POA: Diagnosis not present

## 2017-10-06 DIAGNOSIS — R7303 Prediabetes: Secondary | ICD-10-CM

## 2017-10-06 DIAGNOSIS — Z6832 Body mass index (BMI) 32.0-32.9, adult: Secondary | ICD-10-CM

## 2017-10-06 DIAGNOSIS — E6609 Other obesity due to excess calories: Secondary | ICD-10-CM

## 2017-10-06 DIAGNOSIS — Z23 Encounter for immunization: Secondary | ICD-10-CM | POA: Diagnosis not present

## 2017-10-06 DIAGNOSIS — I10 Essential (primary) hypertension: Secondary | ICD-10-CM

## 2017-10-06 DIAGNOSIS — G4733 Obstructive sleep apnea (adult) (pediatric): Secondary | ICD-10-CM

## 2017-10-06 LAB — COMPREHENSIVE METABOLIC PANEL
ALT: 9 U/L (ref 0–35)
AST: 11 U/L (ref 0–37)
Albumin: 4.1 g/dL (ref 3.5–5.2)
Alkaline Phosphatase: 39 U/L (ref 39–117)
BUN: 20 mg/dL (ref 6–23)
CO2: 26 mEq/L (ref 19–32)
Calcium: 8.7 mg/dL (ref 8.4–10.5)
Chloride: 107 mEq/L (ref 96–112)
Creatinine, Ser: 0.82 mg/dL (ref 0.40–1.20)
GFR: 100.91 mL/min (ref >60.00)
Glucose, Bld: 98 mg/dL (ref 70–99)
Potassium: 3.8 mEq/L (ref 3.5–5.1)
Sodium: 139 mEq/L (ref 135–145)
Total Bilirubin: 0.4 mg/dL (ref 0.2–1.2)
Total Protein: 7.4 g/dL (ref 6.0–8.3)

## 2017-10-06 LAB — HEMOGLOBIN A1C: Hgb A1c MFr Bld: 5.8 % (ref 4.6–6.5)

## 2017-10-06 LAB — MAGNESIUM: Magnesium: 2.4 mg/dL (ref 1.5–2.5)

## 2017-10-06 MED ORDER — AZITHROMYCIN 500 MG PO TABS: 500.0000 mg | ORAL_TABLET | Freq: Every day | ORAL | 0 refills | Status: DC

## 2017-10-06 MED ORDER — FLUCONAZOLE 150 MG PO TABS: 150.0000 mg | ORAL_TABLET | Freq: Once | ORAL | 0 refills | Status: AC

## 2017-10-06 MED FILL — AZITHROMYCIN 500 MG TABLET: 500 | 3 days supply | Qty: 3 | Fill #0

## 2017-10-06 NOTE — Assessment & Plan Note (Signed)
Has not been using cpap and has been having headaches Will restart using it nightly - she understands the risks of not using it

## 2017-10-06 NOTE — Assessment & Plan Note (Signed)
Eating more healthy Not currently exercising but will start walking again Has taken Mag 07 for the past week and lost 10 lbs Will check cmp, mg May restart contrave Follow up in 6 months

## 2017-10-06 NOTE — Assessment & Plan Note (Signed)
Check a1c Low sugar / carb diet Stressed regular exercise, weight loss  

## 2017-10-06 NOTE — Assessment & Plan Note (Signed)
BP well controlled Current regimen effective and well tolerated Continue current medications at current doses Continue weight loss efforts, start regular exercise cmp

## 2017-10-06 NOTE — Assessment & Plan Note (Signed)
Has xanax to use if needed, but has not taken it She will try to avoid taking it Stress level currently controlled Restart regular exercise

## 2017-11-05 MED FILL — metroNIDAZOLE 0.75 % GEL: 0.75 | 5 days supply | Qty: 70 | Fill #0

## 2017-11-11 ENCOUNTER — Encounter: Payer: Self-pay | Admitting: Internal Medicine

## 2017-11-11 NOTE — Telephone Encounter (Signed)
Letter printed.

## 2017-11-12 MED FILL — BYSTOLIC 5 MG TABLET: 5 | 90 days supply | Qty: 90 | Fill #0

## 2017-11-12 MED FILL — TRIAMCINOLONE 0.1% CREAM: 0.1 | 14 days supply | Qty: 30 | Fill #1

## 2017-11-16 ENCOUNTER — Encounter: Payer: Self-pay | Admitting: Internal Medicine

## 2017-11-22 DIAGNOSIS — G4733 Obstructive sleep apnea (adult) (pediatric): Secondary | ICD-10-CM | POA: Diagnosis not present

## 2017-12-15 DIAGNOSIS — N760 Acute vaginitis: Secondary | ICD-10-CM | POA: Diagnosis not present

## 2017-12-15 DIAGNOSIS — K602 Anal fissure, unspecified: Secondary | ICD-10-CM | POA: Diagnosis not present

## 2017-12-20 DIAGNOSIS — F4322 Adjustment disorder with anxiety: Secondary | ICD-10-CM | POA: Diagnosis not present

## 2018-01-10 MED FILL — metroNIDAZOLE 500 MG TABS: 500 | 7 days supply | Qty: 14 | Fill #0

## 2018-01-13 DIAGNOSIS — F4322 Adjustment disorder with anxiety: Secondary | ICD-10-CM | POA: Diagnosis not present

## 2018-03-15 DIAGNOSIS — H04123 Dry eye syndrome of bilateral lacrimal glands: Secondary | ICD-10-CM | POA: Diagnosis not present

## 2018-03-15 DIAGNOSIS — H40013 Open angle with borderline findings, low risk, bilateral: Secondary | ICD-10-CM | POA: Diagnosis not present

## 2018-03-15 DIAGNOSIS — H5213 Myopia, bilateral: Secondary | ICD-10-CM | POA: Diagnosis not present

## 2018-03-15 LAB — HM DIABETES EYE EXAM

## 2018-03-23 DIAGNOSIS — G4733 Obstructive sleep apnea (adult) (pediatric): Secondary | ICD-10-CM | POA: Diagnosis not present

## 2018-04-04 ENCOUNTER — Encounter: Payer: 59 | Admitting: Internal Medicine

## 2018-04-27 ENCOUNTER — Encounter: Payer: Self-pay | Admitting: Internal Medicine

## 2018-04-28 ENCOUNTER — Other Ambulatory Visit: Payer: Self-pay

## 2018-04-28 ENCOUNTER — Encounter: Payer: 59 | Admitting: Internal Medicine

## 2018-04-28 MED ORDER — NEBIVOLOL HCL 5 MG PO TABS: 5.0000 mg | ORAL_TABLET | Freq: Every day | ORAL | 1 refills | Status: DC

## 2018-06-09 DIAGNOSIS — Z6832 Body mass index (BMI) 32.0-32.9, adult: Secondary | ICD-10-CM | POA: Diagnosis not present

## 2018-06-09 DIAGNOSIS — Z01419 Encounter for gynecological examination (general) (routine) without abnormal findings: Secondary | ICD-10-CM | POA: Diagnosis not present

## 2018-06-10 ENCOUNTER — Encounter: Payer: Self-pay | Admitting: Internal Medicine

## 2018-06-10 DIAGNOSIS — Z01419 Encounter for gynecological examination (general) (routine) without abnormal findings: Secondary | ICD-10-CM | POA: Diagnosis not present

## 2018-06-14 LAB — HM PAP SMEAR

## 2018-06-21 NOTE — Progress Notes (Signed)
Subjective:    Patient ID: Candace [name], female    DOB: 03/10/1980, 38 y.o.   MRN: [id number]  HPI She is here for a physical exam.   She does not monitor her blood pressure at home.  She states on occasion she does forget her Bystolic 5 mg.  She did take her dose last night.  She is not compliant with a low-sodium diet.  She is not exercising regularly.  She has gained some weight.  Difficulty sleeping: At times she does experience difficulty sleeping.  She has taken Lunesta in the past only as needed and wondered about taking that again.  She anticipates she would only take it 2-3 times a month.  She states sometimes she has difficulty falling asleep, especially if she has to be up early.  She has tolerated the medication in the past.  ADD: She states she has been diagnosed in the past with ADD.  More recently she was tested again and it was confirmed that she has ADD.  The only medication that she took in the past was Ritalin and she states it did not help.  She states she is able to get her work done, but it just takes a long time.  She would be interested in trying medication. bp - occasionally     Medications and allergies reviewed with patient and updated if appropriate.  Patient Active Problem List   Diagnosis Date Noted  . Prediabetes 03/23/2017  . OSA (obstructive sleep apnea) 01/08/2017  . Obesity 06/26/2016  . Low back pain 09/23/2015  . Vitamin D deficiency 08/12/2015  . Alopecia 06/11/2014  . Situational depression 11/21/2012  . Multinodular thyroid 12/01/2011  . Situational anxiety 11/26/2011  . Insomnia 11/26/2011  . Cervical polyp 11/26/2011  . Eczema 11/26/2011  . Essential hypertension, benign 11/25/2011    Current Outpatient Medications on File Prior to Visit  Medication Sig Dispense Refill  . nebivolol (BYSTOLIC) 5 MG tablet Take 1 tablet (5 mg total) by mouth daily. 90 tablet 1  . NON FORMULARY MAG 07    . triamcinolone cream (KENALOG) 0.1 % Apply 1  application topically 2 (two) times daily as needed. Not more than 2 weeks at a time 30 g 5   No current facility-administered medications on file prior to visit.     Past Medical History:  Diagnosis Date  . Anxiety    no meds  . Depression    no meds  . Hypertension    does not take BP med regularly, instructed to take med nest 3 days.    Past Surgical History:  Procedure Laterality Date  . DILATION AND CURETTAGE OF UTERUS     x 2 polyps removed  . DILATION AND EVACUATION N/A 02/10/2013   Procedure: DILATATION AND EVACUATION;  Surgeon: Zelphia Cairo, MD;  Location: [facility];  Service: Gynecology;  Laterality: N/A;  with intra operative [place]  . SPINE SURGERY     patient denies having spine surgery  . WISDOM TOOTH EXTRACTION  03/2014    Social History   Socioeconomic History  . Marital status: Single    Spouse name: n/a  . Number of children: 0  . Years of education: Not on file  . Highest education level: Not on file  Occupational History  . Occupation: Ecologist    Comment: [place]  Social Needs  . Financial resource strain: Not on file  . Food insecurity:    Worry: Not on file  Inability: Not on file  . Transportation needs:    Medical: Not on file    Non-medical: Not on file  Tobacco Use  . Smoking status: Never Smoker  . Smokeless tobacco: Never Used  Substance and Sexual Activity  . Alcohol use: Yes    Alcohol/week: 3.0 standard drinks    Types: 3 Standard drinks or equivalent per week    Comment: weekends - mixed drinks  . Drug use: No  . Sexual activity: Yes    Partners: Male    Birth control/protection: None  Lifestyle  . Physical activity:    Days per week: Not on file    Minutes per session: Not on file  . Stress: Not on file  Relationships  . Social connections:    Talks on phone: Not on file    Gets together: Not on file    Attends religious service: Not on file    Active member of club or organization: Not on file     Attends meetings of clubs or organizations: Not on file    Relationship status: Not on file  Other Topics Concern  . Not on file  Social History Narrative   Works in ED - behavioral assessments      Exercise:  none    Family History  Problem Relation Age of Onset  . Diabetes Father   . Hypertension Father   . Hypertension Sister   . Hypertension Brother   . Diabetes Maternal Grandmother   . Hypertension Maternal Grandmother     Review of Systems  Constitutional: Negative for chills and fever.  Eyes: Negative for visual disturbance.  Respiratory: Negative for cough, shortness of breath and wheezing.   Cardiovascular: Positive for leg swelling (rare - salt related). Negative for chest pain and palpitations.  Gastrointestinal: Negative for abdominal pain, blood in stool, constipation, diarrhea and nausea.       No gerd  Genitourinary: Negative for dysuria and hematuria.  Musculoskeletal: Negative for arthralgias and back pain.  Skin: Negative for color change and rash.       eczema  Neurological: Positive for headaches (morning when she wakes up). Negative for dizziness and light-headedness.  Psychiatric/Behavioral: The patient is nervous/anxious (occasional).        Objective:   Vitals:   06/22/18 0904  BP: (!) 152/96  Pulse: 78  Resp: 16  Temp: 99.4 F (37.4 C)  SpO2: 98%   Filed Weights   06/22/18 0904  Weight: 196 lb 1.9 oz (89 kg)   Body mass index is 33.66 kg/m.  BP Readings from Last 3 Encounters:  06/22/18 (!) 152/96  10/06/17 132/86  04/02/17 124/86    Wt Readings from Last 3 Encounters:  06/22/18 196 lb 1.9 oz (89 kg)  10/06/17 194 lb (88 kg)  04/02/17 198 lb (89.8 kg)     Physical Exam Constitutional: She appears well-developed and well-nourished. No distress.  HENT:  Head: Normocephalic and atraumatic.  Right Ear: External ear normal. Normal ear canal and TM Left Ear: External ear normal.  Normal ear canal and TM Mouth/Throat:  Oropharynx is clear and moist.  Eyes: Conjunctivae and EOM are normal.  Neck: Neck supple. No tracheal deviation present. No thyromegaly present.  No carotid bruit  Cardiovascular: Normal rate, regular rhythm and normal heart sounds.   No murmur heard.  No edema. Pulmonary/Chest: Effort normal and breath sounds normal. No respiratory distress. She has no wheezes. She has no rales.  Breast: deferred   Abdominal: Soft. She  exhibits no distension. There is no tenderness.  Lymphadenopathy: She has no cervical adenopathy.  Skin: Skin is warm and dry. She is not diaphoretic.  Psychiatric: She has a normal mood and affect. Her behavior is normal.        Assessment & Plan:   Physical exam: Screening blood work ordered Immunizations   Up to date  Gyn   Up to date  Eye exams   Up to date  Exercise  none Weight  Has gained weight - will try to start exercising and eat healthier  - doing meal prep Skin   No concerns besides eczema Substance abuse   none  See Problem List for Assessment and Plan of chronic medical problems.

## 2018-06-22 ENCOUNTER — Other Ambulatory Visit: Payer: Self-pay

## 2018-06-22 ENCOUNTER — Ambulatory Visit (INDEPENDENT_AMBULATORY_CARE_PROVIDER_SITE_OTHER): Payer: 59 | Admitting: Internal Medicine

## 2018-06-22 ENCOUNTER — Encounter: Payer: Self-pay | Admitting: Internal Medicine

## 2018-06-22 ENCOUNTER — Other Ambulatory Visit (INDEPENDENT_AMBULATORY_CARE_PROVIDER_SITE_OTHER): Payer: 59

## 2018-06-22 VITALS — BP 152/96 | HR 78 | Temp 99.4°F | Resp 16 | Ht 64.0 in | Wt 196.1 lb

## 2018-06-22 DIAGNOSIS — F418 Other specified anxiety disorders: Secondary | ICD-10-CM | POA: Diagnosis not present

## 2018-06-22 DIAGNOSIS — I10 Essential (primary) hypertension: Secondary | ICD-10-CM

## 2018-06-22 DIAGNOSIS — R7303 Prediabetes: Secondary | ICD-10-CM | POA: Diagnosis not present

## 2018-06-22 DIAGNOSIS — G4700 Insomnia, unspecified: Secondary | ICD-10-CM

## 2018-06-22 DIAGNOSIS — Z0000 Encounter for general adult medical examination without abnormal findings: Secondary | ICD-10-CM | POA: Diagnosis not present

## 2018-06-22 DIAGNOSIS — F988 Other specified behavioral and emotional disorders with onset usually occurring in childhood and adolescence: Secondary | ICD-10-CM

## 2018-06-22 DIAGNOSIS — L309 Dermatitis, unspecified: Secondary | ICD-10-CM

## 2018-06-22 DIAGNOSIS — G4733 Obstructive sleep apnea (adult) (pediatric): Secondary | ICD-10-CM

## 2018-06-22 LAB — COMPREHENSIVE METABOLIC PANEL WITH GFR
ALT: 9 U/L (ref 0–35)
AST: 12 U/L (ref 0–37)
Albumin: 4 g/dL (ref 3.5–5.2)
Alkaline Phosphatase: 44 U/L (ref 39–117)
BUN: 15 mg/dL (ref 6–23)
CO2: 27 meq/L (ref 19–32)
Calcium: 9 mg/dL (ref 8.4–10.5)
Chloride: 104 meq/L (ref 96–112)
Creatinine, Ser: 0.7 mg/dL (ref 0.40–1.20)
GFR: 113.53 mL/min
Glucose, Bld: 98 mg/dL (ref 70–99)
Potassium: 3.6 meq/L (ref 3.5–5.1)
Sodium: 139 meq/L (ref 135–145)
Total Bilirubin: 0.3 mg/dL (ref 0.2–1.2)
Total Protein: 7.2 g/dL (ref 6.0–8.3)

## 2018-06-22 LAB — CBC WITH DIFFERENTIAL/PLATELET
Basophils Absolute: 0 K/uL (ref 0.0–0.1)
Basophils Relative: 0.7 % (ref 0.0–3.0)
Eosinophils Absolute: 0.3 K/uL (ref 0.0–0.7)
Eosinophils Relative: 7.4 % — ABNORMAL HIGH (ref 0.0–5.0)
HCT: 35.8 % — ABNORMAL LOW (ref 36.0–46.0)
Hemoglobin: 12.3 g/dL (ref 12.0–15.0)
Lymphocytes Relative: 39.9 % (ref 12.0–46.0)
Lymphs Abs: 1.7 K/uL (ref 0.7–4.0)
MCHC: 34.5 g/dL (ref 30.0–36.0)
MCV: 88.4 fl (ref 78.0–100.0)
Monocytes Absolute: 0.4 K/uL (ref 0.1–1.0)
Monocytes Relative: 8.3 % (ref 3.0–12.0)
Neutro Abs: 1.9 K/uL (ref 1.4–7.7)
Neutrophils Relative %: 43.7 % (ref 43.0–77.0)
Platelets: 187 K/uL (ref 150.0–400.0)
RBC: 4.05 Mil/uL (ref 3.87–5.11)
RDW: 13.6 % (ref 11.5–15.5)
WBC: 4.3 K/uL (ref 4.0–10.5)

## 2018-06-22 LAB — TSH: TSH: 1.63 u[IU]/mL (ref 0.35–4.50)

## 2018-06-22 LAB — LIPID PANEL
Cholesterol: 181 mg/dL (ref 0–200)
HDL: 52.8 mg/dL
LDL Cholesterol: 116 mg/dL — ABNORMAL HIGH (ref 0–99)
NonHDL: 128.05
Total CHOL/HDL Ratio: 3
Triglycerides: 58 mg/dL (ref 0.0–149.0)
VLDL: 11.6 mg/dL (ref 0.0–40.0)

## 2018-06-22 LAB — HEMOGLOBIN A1C: Hgb A1c MFr Bld: 5.8 % (ref 4.6–6.5)

## 2018-06-22 MED ORDER — TRIAMCINOLONE ACETONIDE 0.1 % EX CREA: 1.0000 "application " | TOPICAL_CREAM | Freq: Two times a day (BID) | CUTANEOUS | 5 refills | Status: DC | PRN

## 2018-06-22 MED ORDER — ESZOPICLONE 2 MG PO TABS: 2.0000 mg | ORAL_TABLET | Freq: Every evening | ORAL | 0 refills | Status: DC | PRN

## 2018-06-22 MED ORDER — NEBIVOLOL HCL 10 MG PO TABS: 10.0000 mg | ORAL_TABLET | Freq: Every day | ORAL | 1 refills | Status: DC

## 2018-06-22 NOTE — Patient Instructions (Addendum)
Tests ordered today. Your results will be released to [place] (or called to you) after review, usually within 72hours after test completion. If any changes need to be made, you will be notified at that same time.  All other Health Maintenance issues reviewed.   All recommended immunizations and age-appropriate screenings are up-to-date or discussed.  No immunizations administered today.   Medications reviewed and updated.  Changes include :   bystolic increased to 10 mg.  Take lunesta as needed only for insomnia.   Your prescription(s) have been submitted to your pharmacy. Please take as directed and contact our office if you believe you are having problem(s) with the medication(s).  Try to use your CPAP more consistently.  Work on weight loss.  Monitor your BP at home.    Please followup in 6 months   Health Maintenance, Female Adopting a healthy lifestyle and getting preventive care can go a long way to promote health and wellness. Talk with your health care provider about what schedule of regular examinations is right for you. This is a good chance for you to check in with your provider about disease prevention and staying healthy. In between checkups, there are plenty of things you can do on your own. Experts have done a lot of research about which lifestyle changes and preventive measures are most likely to keep you healthy. Ask your health care provider for more information. Weight and diet Eat a healthy diet  Be sure to include plenty of vegetables, fruits, low-fat dairy products, and lean protein.  Do not eat a lot of foods high in solid fats, added sugars, or salt.  Get regular exercise. This is one of the most important things you can do for your health. ? Most adults should exercise for at least 150 minutes each week. The exercise should increase your heart rate and make you sweat (moderate-intensity exercise). ? Most adults should also do strengthening exercises at least  twice a week. This is in addition to the moderate-intensity exercise. Maintain a healthy weight  Body mass index (BMI) is a measurement that can be used to identify possible weight problems. It estimates body fat based on height and weight. Your health care provider can help determine your BMI and help you achieve or maintain a healthy weight.  For females 39 years of age and older: ? A BMI below 18.5 is considered underweight. ? A BMI of 18.5 to 24.9 is normal. ? A BMI of 25 to 29.9 is considered overweight. ? A BMI of 30 and above is considered obese. Watch levels of cholesterol and blood lipids  You should start having your blood tested for lipids and cholesterol at 38 years of age, then have this test every 5 years.  You may need to have your cholesterol levels checked more often if: ? Your lipid or cholesterol levels are high. ? You are older than 38 years of age. ? You are at high risk for heart disease. Cancer screening Lung Cancer  Lung cancer screening is recommended for adults 14-50 years old who are at high risk for lung cancer because of a history of smoking.  A yearly low-dose CT scan of the lungs is recommended for people who: ? Currently smoke. ? Have quit within the past 15 years. ? Have at least a 30-pack-year history of smoking. A pack year is smoking an average of one pack of cigarettes a day for 1 year.  Yearly screening should continue until it has been 15 years  since you quit.  Yearly screening should stop if you develop a health problem that would prevent you from having lung cancer treatment. Breast Cancer  Practice breast self-awareness. This means understanding how your breasts normally appear and feel.  It also means doing regular breast self-exams. Let your health care provider know about any changes, no matter how small.  If you are in your 20s or 30s, you should have a clinical breast exam (CBE) by a health care provider every 1-3 years as part of a  regular health exam.  If you are 54 or older, have a CBE every year. Also consider having a breast X-ray (mammogram) every year.  If you have a family history of breast cancer, talk to your health care provider about genetic screening.  If you are at high risk for breast cancer, talk to your health care provider about having an MRI and a mammogram every year.  Breast cancer gene (BRCA) assessment is recommended for women who have family members with BRCA-related cancers. BRCA-related cancers include: ? Breast. ? Ovarian. ? Tubal. ? Peritoneal cancers.  Results of the assessment will determine the need for genetic counseling and BRCA1 and BRCA2 testing. Cervical Cancer Your health care provider may recommend that you be screened regularly for cancer of the pelvic organs (ovaries, uterus, and vagina). This screening involves a pelvic examination, including checking for microscopic changes to the surface of your cervix (Pap test). You may be encouraged to have this screening done every 3 years, beginning at age 68.  For women ages 34-65, health care providers may recommend pelvic exams and Pap testing every 3 years, or they may recommend the Pap and pelvic exam, combined with testing for human papilloma virus (HPV), every 5 years. Some types of HPV increase your risk of cervical cancer. Testing for HPV may also be done on women of any age with unclear Pap test results.  Other health care providers may not recommend any screening for nonpregnant women who are considered low risk for pelvic cancer and who do not have symptoms. Ask your health care provider if a screening pelvic exam is right for you.  If you have had past treatment for cervical cancer or a condition that could lead to cancer, you need Pap tests and screening for cancer for at least 20 years after your treatment. If Pap tests have been discontinued, your risk factors (such as having a new sexual partner) need to be reassessed to  determine if screening should resume. Some women have medical problems that increase the chance of getting cervical cancer. In these cases, your health care provider may recommend more frequent screening and Pap tests. Colorectal Cancer  This type of cancer can be detected and often prevented.  Routine colorectal cancer screening usually begins at 38 years of age and continues through 38 years of age.  Your health care provider may recommend screening at an earlier age if you have risk factors for colon cancer.  Your health care provider may also recommend using home test kits to check for hidden blood in the stool.  A small camera at the end of a tube can be used to examine your colon directly (sigmoidoscopy or colonoscopy). This is done to check for the earliest forms of colorectal cancer.  Routine screening usually begins at age 31.  Direct examination of the colon should be repeated every 5-10 years through 38 years of age. However, you may need to be screened more often if early forms of  precancerous polyps or small growths are found. Skin Cancer  Check your skin from head to toe regularly.  Tell your health care provider about any new moles or changes in moles, especially if there is a change in a mole's shape or color.  Also tell your health care provider if you have a mole that is larger than the size of a pencil eraser.  Always use sunscreen. Apply sunscreen liberally and repeatedly throughout the day.  Protect yourself by wearing long sleeves, pants, a wide-brimmed hat, and sunglasses whenever you are outside. Heart disease, diabetes, and high blood pressure  High blood pressure causes heart disease and increases the risk of stroke. High blood pressure is more likely to develop in: ? People who have blood pressure in the high end of the normal range (130-139/85-89 mm Hg). ? People who are overweight or obese. ? People who are African American.  If you are 18-39 years of  age, have your blood pressure checked every 3-5 years. If you are 55 years of age or older, have your blood pressure checked every year. You should have your blood pressure measured twice-once when you are at a hospital or clinic, and once when you are not at a hospital or clinic. Record the average of the two measurements. To check your blood pressure when you are not at a hospital or clinic, you can use: ? An automated blood pressure machine at a pharmacy. ? A home blood pressure monitor.  If you are between 85 years and 45 years old, ask your health care provider if you should take aspirin to prevent strokes.  Have regular diabetes screenings. This involves taking a blood sample to check your fasting blood sugar level. ? If you are at a normal weight and have a low risk for diabetes, have this test once every three years after 38 years of age. ? If you are overweight and have a high risk for diabetes, consider being tested at a younger age or more often. Preventing infection Hepatitis B  If you have a higher risk for hepatitis B, you should be screened for this virus. You are considered at high risk for hepatitis B if: ? You were born in a country where hepatitis B is common. Ask your health care provider which countries are considered high risk. ? Your parents were born in a high-risk country, and you have not been immunized against hepatitis B (hepatitis B vaccine). ? You have HIV or AIDS. ? You use needles to inject street drugs. ? You live with someone who has hepatitis B. ? You have had sex with someone who has hepatitis B. ? You get hemodialysis treatment. ? You take certain medicines for conditions, including cancer, organ transplantation, and autoimmune conditions. Hepatitis C  Blood testing is recommended for: ? Everyone born from [birth year] through 1965. ? Anyone with known risk factors for hepatitis C. Sexually transmitted infections (STIs)  You should be screened for sexually  transmitted infections (STIs) including gonorrhea and chlamydia if: ? You are sexually active and are younger than 38 years of age. ? You are older than 38 years of age and your health care provider tells you that you are at risk for this type of infection. ? Your sexual activity has changed since you were last screened and you are at an increased risk for chlamydia or gonorrhea. Ask your health care provider if you are at risk.  If you do not have HIV, but are at risk, it may  be recommended that you take a prescription medicine daily to prevent HIV infection. This is called pre-exposure prophylaxis (PrEP). You are considered at risk if: ? You are sexually active and do not regularly use condoms or know the HIV status of your partner(s). ? You take drugs by injection. ? You are sexually active with a partner who has HIV. Talk with your health care provider about whether you are at high risk of being infected with HIV. If you choose to begin PrEP, you should first be tested for HIV. You should then be tested every 3 months for as long as you are taking PrEP. Pregnancy  If you are premenopausal and you may become pregnant, ask your health care provider about preconception counseling.  If you may become pregnant, take 400 to 800 micrograms (mcg) of folic acid every day.  If you want to prevent pregnancy, talk to your health care provider about birth control (contraception). Osteoporosis and menopause  Osteoporosis is a disease in which the bones lose minerals and strength with aging. This can result in serious bone fractures. Your risk for osteoporosis can be identified using a bone density scan.  If you are 62 years of age or older, or if you are at risk for osteoporosis and fractures, ask your health care provider if you should be screened.  Ask your health care provider whether you should take a calcium or vitamin D supplement to lower your risk for osteoporosis.  Menopause may have certain  physical symptoms and risks.  Hormone replacement therapy may reduce some of these symptoms and risks. Talk to your health care provider about whether hormone replacement therapy is right for you. Follow these instructions at home:  Schedule regular health, dental, and eye exams.  Stay current with your immunizations.  Do not use any tobacco products including cigarettes, chewing tobacco, or electronic cigarettes.  If you are pregnant, do not drink alcohol.  If you are breastfeeding, limit how much and how often you drink alcohol.  Limit alcohol intake to no more than 1 drink per day for nonpregnant women. One drink equals 12 ounces of beer, 5 ounces of wine, or 1 ounces of hard liquor.  Do not use street drugs.  Do not share needles.  Ask your health care provider for help if you need support or information about quitting drugs.  Tell your health care provider if you often feel depressed.  Tell your health care provider if you have ever been abused or do not feel safe at home. This information is not intended to replace advice given to you by your health care provider. Make sure you discuss any questions you have with your health care provider. Document Released: 08/04/2010 Document Revised: 06/27/2015 Document Reviewed: 10/23/2014 Elsevier Interactive Patient Education  2019 [employer].

## 2018-06-22 NOTE — Assessment & Plan Note (Signed)
Check a1c Low sugar / carb diet Stressed regular exercise   

## 2018-06-22 NOTE — Assessment & Plan Note (Addendum)
Difficulty getting to sleep at times-possibly 2-3 times a month.  This typically occurs when she has to be up early for work and just cannot settle down Has taken Lunesta in the past and would like to take this only as needed Discussed that she should try everything natural first, which she feels she has exhausted Stressed not taking this on a regular basis and discussed there could be possible long-term side effects She would still like to have this on hand for as needed Lunesta 2 mg nightly. HS prn

## 2018-06-22 NOTE — Assessment & Plan Note (Signed)
Blood pressure does not seem to be controlled overall Will increase Bystolic to 10 mg daily Encouraged her to check her blood pressure regularly to make sure it is controlled Start regular exercise Work on low-sodium diet and improving her diet overall Work on weight loss CMP, TSH

## 2018-06-22 NOTE — Assessment & Plan Note (Signed)
Not using CPAP nightly-discussed consequences of not using CPAP Encouraged nightly use

## 2018-06-22 NOTE — Assessment & Plan Note (Signed)
Steroid cream prn - refilled

## 2018-06-22 NOTE — Assessment & Plan Note (Signed)
She states she has been tested in the past and she does have ADD-I have not treated her for this.  Asked her to get her most recent report from the psychologist so that I can review  If she really does want to start a medication I would want her to come back into the office for a visit so we can recheck her blood pressure and then consider medication

## 2018-06-23 ENCOUNTER — Encounter: Payer: Self-pay | Admitting: Internal Medicine

## 2018-06-23 DIAGNOSIS — G4733 Obstructive sleep apnea (adult) (pediatric): Secondary | ICD-10-CM | POA: Diagnosis not present

## 2018-06-28 MED FILL — ESZOPICLONE 2 MG TABLET: 2 | 30 days supply | Qty: 30 | Fill #0

## 2018-06-28 MED FILL — BYSTOLIC 10 MG TABLET: 10 | 30 days supply | Qty: 30 | Fill #0

## 2018-06-28 MED FILL — TRIAMCINOLONE 0.1% CREAM: 0.1 | 14 days supply | Qty: 30 | Fill #0

## 2018-09-09 ENCOUNTER — Encounter: Payer: Self-pay | Admitting: Internal Medicine

## 2018-09-12 ENCOUNTER — Other Ambulatory Visit: Payer: Self-pay

## 2018-09-12 MED ORDER — NEBIVOLOL HCL 10 MG PO TABS: 10.0000 mg | ORAL_TABLET | Freq: Every day | ORAL | 1 refills | Status: DC

## 2018-09-16 ENCOUNTER — Encounter: Payer: Self-pay | Admitting: Internal Medicine

## 2018-09-19 MED FILL — BYSTOLIC 10 MG TABLET: 10 | 90 days supply | Qty: 90 | Fill #0

## 2018-12-15 MED FILL — BYSTOLIC 10 MG TABLET: 10 | 90 days supply | Qty: 90 | Fill #1

## 2018-12-23 ENCOUNTER — Ambulatory Visit: Payer: 59 | Admitting: Internal Medicine

## 2018-12-26 NOTE — Patient Instructions (Addendum)
  Tests ordered today. Your results will be released to [place] (or called to you) after review.  If any changes need to be made, you will be notified at that same time.   Medications reviewed and updated.  Changes include :   labetalol for your BP.  Vyvanse for your ADD.    Your prescription(s) have been submitted to your pharmacy. Please take as directed and contact our office if you believe you are having problem(s) with the medication(s).    Please followup in 1 month

## 2018-12-26 NOTE — Progress Notes (Signed)
Subjective:    Patient ID: Candace [name], female    DOB: [birth year], 38 y.o.   MRN: [id number]  HPI The patient is here for follow up.  She is not exercising regularly.    She is thinking about getting pregnant at some point in the near future.  She has been referred to a fertility specialist.    Hypertension: She is taking her medication daily, but did not take it last night.  She is not always compliant with a low sodium diet.  She denies chest pain, palpitations, edema, shortness of breath and regular headaches.     Prediabetes:  She is not compliant with a low sugar/carbohydrate diet.  She is not exercising regularly.  Anxiety, Depression: She is no longer on lexapro.  She feels her anxiety is well controlled and she does not feel she needs any medication.   Obesity:  She is not exercising regularly.  She is working on weight loss.  She has has been eating too many carbohydrates and sugars.  She sometimes has problems with her portion sizes.    Vitamin d def:  She is not currently taking vitamin d supplementation.    ADD; she has been tested and is interested in trying medication.  She works three different jobs and is finding it hard to get her work down.  She would like to see if the medication helps or not.    OSA: She is not using her CPAP nightly.  She states she does not tolerate it well-having any mask on her face.  Medications and allergies reviewed with patient and updated if appropriate.  Patient Active Problem List   Diagnosis Date Noted  . ADD (attention deficit disorder) 06/22/2018  . Prediabetes 03/23/2017  . OSA (obstructive sleep apnea) 01/08/2017  . Obesity 06/26/2016  . Low back pain 09/23/2015  . Vitamin D deficiency 08/12/2015  . Alopecia 06/11/2014  . Situational depression 11/21/2012  . Multinodular thyroid 12/01/2011  . Situational anxiety 11/26/2011  . Insomnia 11/26/2011  . Cervical polyp 11/26/2011  . Eczema 11/26/2011  . Essential  hypertension, benign 11/25/2011    Current Outpatient Medications on File Prior to Visit  Medication Sig Dispense Refill  . nebivolol (BYSTOLIC) 10 MG tablet Take 1 tablet (10 mg total) by mouth daily. 90 tablet 1  . NON FORMULARY MAG 07    . triamcinolone cream (KENALOG) 0.1 % Apply 1 application topically 2 (two) times daily as needed. Not more than 2 weeks at a time 30 g 5   No current facility-administered medications on file prior to visit.     Past Medical History:  Diagnosis Date  . Anxiety    no meds  . Depression    no meds  . Hypertension    does not take BP med regularly, instructed to take med nest 3 days.    Past Surgical History:  Procedure Laterality Date  . DILATION AND CURETTAGE OF UTERUS     x 2 polyps removed  . DILATION AND EVACUATION N/A 02/10/2013   Procedure: DILATATION AND EVACUATION;  Surgeon: Zelphia Cairo, MD;  Location: [facility];  Service: Gynecology;  Laterality: N/A;  with intra operative [place]  . SPINE SURGERY     patient denies having spine surgery  . WISDOM TOOTH EXTRACTION  03/2014    Social History   Socioeconomic History  . Marital status: Single    Spouse name: n/a  . Number of children: 0  . Years of education: Not  on file  . Highest education level: Not on file  Occupational History  . Occupation: Adult nurse    Comment: [employer]  Social Needs  . Financial resource strain: Not on file  . Food insecurity    Worry: Not on file    Inability: Not on file  . Transportation needs    Medical: Not on file    Non-medical: Not on file  Tobacco Use  . Smoking status: Never Smoker  . Smokeless tobacco: Never Used  Substance and Sexual Activity  . Alcohol use: Yes    Alcohol/week: 3.0 standard drinks    Types: 3 Standard drinks or equivalent per week    Comment: weekends - mixed drinks  . Drug use: No  . Sexual activity: Yes    Partners: Male    Birth control/protection: None  Lifestyle  . Physical activity    Days per  week: Not on file    Minutes per session: Not on file  . Stress: Not on file  Relationships  . Social Herbalist on phone: Not on file    Gets together: Not on file    Attends religious service: Not on file    Active member of club or organization: Not on file    Attends meetings of clubs or organizations: Not on file    Relationship status: Not on file  Other Topics Concern  . Not on file  Social History Narrative   Works in ED - behavioral assessments      Exercise:  none    Family History  Problem Relation Age of Onset  . Diabetes Father   . Hypertension Father   . Hypertension Sister   . Hypertension Brother   . Diabetes Maternal Grandmother   . Hypertension Maternal Grandmother     Review of Systems  Constitutional: Negative for chills and fever.  Respiratory: Negative for cough, shortness of breath and wheezing.   Cardiovascular: Negative for chest pain, palpitations and leg swelling.  Neurological: Positive for headaches. Negative for dizziness and light-headedness.       Objective:   Vitals:   12/27/18 0819  BP: (!) 138/94  Pulse: 78  Resp: 16  Temp: 98.8 F (37.1 C)  SpO2: 98%   BP Readings from Last 3 Encounters:  12/27/18 (!) 138/94  06/22/18 (!) 152/96  10/06/17 132/86   Wt Readings from Last 3 Encounters:  12/27/18 199 lb 12.5 oz (90.6 kg)  06/22/18 196 lb 1.9 oz (89 kg)  10/06/17 194 lb (88 kg)   Body mass index is 34.29 kg/m.   Physical Exam    Constitutional: Appears well-developed and well-nourished. No distress.  HENT:  Head: Normocephalic and atraumatic.  Neck: Neck supple. No tracheal deviation present. No thyromegaly present.  No cervical lymphadenopathy Cardiovascular: Normal rate, regular rhythm and normal heart sounds.   No murmur heard. No carotid bruit .  No edema Pulmonary/Chest: Effort normal and breath sounds normal. No respiratory distress. No has no wheezes. No rales.  Skin: Skin is warm and dry. Not  diaphoretic.  Psychiatric: Normal mood and affect. Behavior is normal.      Assessment & Plan:    See Problem List for Assessment and Plan of chronic medical problems.

## 2018-12-27 ENCOUNTER — Other Ambulatory Visit: Payer: Self-pay

## 2018-12-27 ENCOUNTER — Encounter: Payer: Self-pay | Admitting: Internal Medicine

## 2018-12-27 ENCOUNTER — Ambulatory Visit (INDEPENDENT_AMBULATORY_CARE_PROVIDER_SITE_OTHER): Payer: 59 | Admitting: Internal Medicine

## 2018-12-27 ENCOUNTER — Other Ambulatory Visit (INDEPENDENT_AMBULATORY_CARE_PROVIDER_SITE_OTHER): Payer: 59

## 2018-12-27 VITALS — BP 138/94 | HR 78 | Temp 98.8°F | Resp 16 | Ht 64.0 in | Wt 199.8 lb

## 2018-12-27 DIAGNOSIS — Z6832 Body mass index (BMI) 32.0-32.9, adult: Secondary | ICD-10-CM

## 2018-12-27 DIAGNOSIS — E559 Vitamin D deficiency, unspecified: Secondary | ICD-10-CM

## 2018-12-27 DIAGNOSIS — F418 Other specified anxiety disorders: Secondary | ICD-10-CM | POA: Diagnosis not present

## 2018-12-27 DIAGNOSIS — E6609 Other obesity due to excess calories: Secondary | ICD-10-CM | POA: Diagnosis not present

## 2018-12-27 DIAGNOSIS — I10 Essential (primary) hypertension: Secondary | ICD-10-CM

## 2018-12-27 DIAGNOSIS — R7303 Prediabetes: Secondary | ICD-10-CM

## 2018-12-27 DIAGNOSIS — F4321 Adjustment disorder with depressed mood: Secondary | ICD-10-CM

## 2018-12-27 DIAGNOSIS — F988 Other specified behavioral and emotional disorders with onset usually occurring in childhood and adolescence: Secondary | ICD-10-CM

## 2018-12-27 DIAGNOSIS — G4733 Obstructive sleep apnea (adult) (pediatric): Secondary | ICD-10-CM | POA: Diagnosis not present

## 2018-12-27 LAB — CBC WITH DIFFERENTIAL/PLATELET
Basophils Absolute: 0 10*3/uL (ref 0.0–0.1)
Basophils Relative: 1 % (ref 0.0–3.0)
Eosinophils Absolute: 0.4 10*3/uL (ref 0.0–0.7)
Eosinophils Relative: 10.6 % — ABNORMAL HIGH (ref 0.0–5.0)
HCT: 37.2 % (ref 36.0–46.0)
Hemoglobin: 12.3 g/dL (ref 12.0–15.0)
Lymphocytes Relative: 43.3 % (ref 12.0–46.0)
Lymphs Abs: 1.6 10*3/uL (ref 0.7–4.0)
MCHC: 33.1 g/dL (ref 30.0–36.0)
MCV: 87.8 fl (ref 78.0–100.0)
Monocytes Absolute: 0.3 10*3/uL (ref 0.1–1.0)
Monocytes Relative: 8 % (ref 3.0–12.0)
Neutro Abs: 1.3 10*3/uL — ABNORMAL LOW (ref 1.4–7.7)
Neutrophils Relative %: 37.1 % — ABNORMAL LOW (ref 43.0–77.0)
Platelets: 183 10*3/uL (ref 150.0–400.0)
RBC: 4.24 Mil/uL (ref 3.87–5.11)
RDW: 13.5 % (ref 11.5–15.5)
WBC: 3.6 10*3/uL — ABNORMAL LOW (ref 4.0–10.5)

## 2018-12-27 LAB — COMPREHENSIVE METABOLIC PANEL
ALT: 9 U/L (ref 0–35)
AST: 10 U/L (ref 0–37)
Albumin: 3.9 g/dL (ref 3.5–5.2)
Alkaline Phosphatase: 44 U/L (ref 39–117)
BUN: 14 mg/dL (ref 6–23)
CO2: 26 mEq/L (ref 19–32)
Calcium: 8.9 mg/dL (ref 8.4–10.5)
Chloride: 106 mEq/L (ref 96–112)
Creatinine, Ser: 0.7 mg/dL (ref 0.40–1.20)
GFR: 113.21 mL/min (ref >60.00)
Glucose, Bld: 102 mg/dL — ABNORMAL HIGH (ref 70–99)
Potassium: 3.6 mEq/L (ref 3.5–5.1)
Sodium: 138 mEq/L (ref 135–145)
Total Bilirubin: 0.5 mg/dL (ref 0.2–1.2)
Total Protein: 7.4 g/dL (ref 6.0–8.3)

## 2018-12-27 LAB — HEMOGLOBIN A1C: Hgb A1c MFr Bld: 5.7 % (ref 4.6–6.5)

## 2018-12-27 LAB — VITAMIN D 25 HYDROXY (VIT D DEFICIENCY, FRACTURES): VITD: 18.9 ng/mL — ABNORMAL LOW (ref 30.00–100.00)

## 2018-12-27 MED ORDER — LABETALOL HCL 200 MG PO TABS: 200.0000 mg | ORAL_TABLET | Freq: Two times a day (BID) | ORAL | 5 refills | Status: DC

## 2018-12-27 MED ORDER — MULTIVITAMINS PO CAPS: 1.0000 | ORAL_CAPSULE | Freq: Every day | ORAL | Status: DC

## 2018-12-27 MED ORDER — LISDEXAMFETAMINE DIMESYLATE 30 MG PO CAPS: 30.0000 mg | ORAL_CAPSULE | Freq: Every day | ORAL | 0 refills | Status: DC

## 2018-12-27 NOTE — Assessment & Plan Note (Signed)
She is not using her cpap nightly Stressed using it regularly

## 2018-12-27 NOTE — Assessment & Plan Note (Addendum)
Check a1c Low sugar / carb diet Stressed regular exercise Discussed importance of weight loss

## 2018-12-27 NOTE — Assessment & Plan Note (Signed)
Encouraged improving her diet, decreasing portions and regular exercise Encouraged weight loss, especially because her blood pressure is elevated and considering getting pregnant

## 2018-12-27 NOTE — Assessment & Plan Note (Signed)
Level low in past Not taking any vitamin d now Check level

## 2018-12-27 NOTE — Assessment & Plan Note (Signed)
Blood pressure elevated here today, but she did not take her medication last night She is thinking about eventually trying to get pregnant, but this may not be related way We will change to labetalol since she will be trying to get pregnant Start 20 mg twice daily Stressed that she needs to monitor her blood pressure at home or at work to make sure it is well controlled Discussed the importance of having blood pressure well controlled while trying to get pregnant and throughout pregnancy

## 2018-12-27 NOTE — Assessment & Plan Note (Signed)
Would like to try treatment She is thinking about getting pregnant at some point but needs to see a fertility specialist  Would like to  see if medication helps or not Ok to try, but advised when she is trying to get pregnant she can not be on medication because of risks of births defects She understands F/ u in one month

## 2018-12-30 ENCOUNTER — Encounter: Payer: Self-pay | Admitting: Internal Medicine

## 2019-01-05 ENCOUNTER — Encounter: Payer: Self-pay | Admitting: Internal Medicine

## 2019-01-05 NOTE — Progress Notes (Signed)
Outside notes received. Information abstracted. Notes sent to scan.  

## 2019-01-16 MED FILL — LETROZOLE 2.5 MG TABLET: 2.5 | 15 days supply | Qty: 15 | Fill #0

## 2019-01-16 MED FILL — metFORMIN HCL 1000 MG TABS: 1000 | 30 days supply | Qty: 60 | Fill #0

## 2019-01-19 MED FILL — TRIAMCINOLONE 0.1% CREAM: 0.1 | 14 days supply | Qty: 30 | Fill #1

## 2019-01-23 NOTE — Progress Notes (Signed)
Virtual Visit via Video Note  I connected with Candace [name] on 01/24/19 at  7:45 AM EST by a video enabled telemedicine application and verified that I am speaking with the correct person using two identifiers.   I discussed the limitations of evaluation and management by telemedicine and the availability of in person appointments. The patient expressed understanding and agreed to proceed.  Present for the visit:  Myself, Dr Billey Gosling, Waldon Merl.  The patient is currently at home and I am in the office.    No referring provider.    History of Present Illness: She is here for follow up of her chronic medical conditions.    ADD:  She is taking her medication as prescribed.  She feels the medication is effective.  She denies side effects, including palpitations, headaches, lightheadedness, decreased appetite and weight loss.     Social History   Socioeconomic History  . Marital status: Single    Spouse name: n/a  . Number of children: 0  . Years of education: Not on file  . Highest education level: Not on file  Occupational History  . Occupation: Adult nurse    Comment: [employer]  Tobacco Use  . Smoking status: Never Smoker  . Smokeless tobacco: Never Used  Substance and Sexual Activity  . Alcohol use: Yes    Alcohol/week: 3.0 standard drinks    Types: 3 Standard drinks or equivalent per week    Comment: weekends - mixed drinks  . Drug use: No  . Sexual activity: Yes    Partners: Male    Birth control/protection: None  Other Topics Concern  . Not on file  Social History Narrative   Works in ED - behavioral assessments      Exercise:  none   Social Determinants of Health   Financial Resource Strain:   . Difficulty of Paying Living Expenses: Not on file  Food Insecurity:   . Worried About Charity fundraiser in the Last Year: Not on file  . Ran Out of Food in the Last Year: Not on file  Transportation Needs:   . Lack of Transportation (Medical): Not  on file  . Lack of Transportation (Non-Medical): Not on file  Physical Activity:   . Days of Exercise per Week: Not on file  . Minutes of Exercise per Session: Not on file  Stress:   . Feeling of Stress : Not on file  Social Connections:   . Frequency of Communication with Friends and Family: Not on file  . Frequency of Social Gatherings with Friends and Family: Not on file  . Attends Religious Services: Not on file  . Active Member of Clubs or Organizations: Not on file  . Attends Archivist Meetings: Not on file  . Marital Status: Not on file     Observations/Objective: Appears well in NAD   Assessment and Plan:  See Problem List for Assessment and Plan of chronic medical problems.   Follow Up Instructions:    I discussed the assessment and treatment plan with the patient. The patient was provided an opportunity to ask questions and all were answered. The patient agreed with the plan and demonstrated an understanding of the instructions.   The patient was advised to call back or seek an in-person evaluation if the symptoms worsen or if the condition fails to improve as anticipated.    Binnie Rail, MD  This encounter was created in error - please disregard.

## 2019-01-24 ENCOUNTER — Encounter: Payer: 59 | Admitting: Internal Medicine

## 2019-01-24 ENCOUNTER — Encounter: Payer: Self-pay | Admitting: Internal Medicine

## 2019-01-24 ENCOUNTER — Ambulatory Visit (INDEPENDENT_AMBULATORY_CARE_PROVIDER_SITE_OTHER): Payer: 59 | Admitting: Internal Medicine

## 2019-01-24 DIAGNOSIS — F988 Other specified behavioral and emotional disorders with onset usually occurring in childhood and adolescence: Secondary | ICD-10-CM

## 2019-01-24 NOTE — Assessment & Plan Note (Signed)
Overall he was medication Has had some mild decreased appetite and has lost a little weight.  Also drinking more water because she is thirsty-for her all good side effects She is finding the medication to be effective at the current dose Continue Vyvanse 30 mg daily-she is not due for prescription because she did not start medication right away and will let us know when she is due Follow-up in 6 months  She is thinking about getting pregnant in the next year.  Stressed that she cannot be on this medication when she gets pregnant because it does cause birth defects.  Advised that she needs to plan ahead and stop the medication before she tries.

## 2019-01-24 NOTE — Progress Notes (Signed)
Virtual Visit via Video Note  I connected with Candace [name] on 01/24/19 at  1:00 PM EST by a video enabled telemedicine application and verified that I am speaking with the correct person using two identifiers.   I discussed the limitations of evaluation and management by telemedicine and the availability of in person appointments. The patient expressed understanding and agreed to proceed.  Present for the visit:  Myself, Dr Cheryll Cockayne, Melynda Ripple.  The patient is currently at home and I am in the office.    No referring provider.    History of Present Illness: She is here for follow up of her chronic medical conditions.    ADD:  She is taking her medication as prescribed.  She feels the medication is effective.  She has had increased focus and concentration.  She has had a little decreased appetite and has lost some weight.  She is more thirst and is drinking more water.  She denies side effects, including palpitations, headaches, lightheadedness.   Review of Systems  Constitutional: Positive for weight loss (decreased apptite).  Cardiovascular: Negative for chest pain and palpitations.  Neurological: Positive for headaches (occ, nothing new).     Social History   Socioeconomic History  . Marital status: Single    Spouse name: n/a  . Number of children: 0  . Years of education: Not on file  . Highest education level: Not on file  Occupational History  . Occupation: Ecologist    Comment: [employer]  Tobacco Use  . Smoking status: Never Smoker  . Smokeless tobacco: Never Used  Substance and Sexual Activity  . Alcohol use: Yes    Alcohol/week: 3.0 standard drinks    Types: 3 Standard drinks or equivalent per week    Comment: weekends - mixed drinks  . Drug use: No  . Sexual activity: Yes    Partners: Male    Birth control/protection: None  Other Topics Concern  . Not on file  Social History Narrative   Works in ED - behavioral assessments      Exercise:  none   Social Determinants of Health   Financial Resource Strain:   . Difficulty of Paying Living Expenses: Not on file  Food Insecurity:   . Worried About Programme researcher, broadcasting/film/video in the Last Year: Not on file  . Ran Out of Food in the Last Year: Not on file  Transportation Needs:   . Lack of Transportation (Medical): Not on file  . Lack of Transportation (Non-Medical): Not on file  Physical Activity:   . Days of Exercise per Week: Not on file  . Minutes of Exercise per Session: Not on file  Stress:   . Feeling of Stress : Not on file  Social Connections:   . Frequency of Communication with Friends and Family: Not on file  . Frequency of Social Gatherings with Friends and Family: Not on file  . Attends Religious Services: Not on file  . Active Member of Clubs or Organizations: Not on file  . Attends Banker Meetings: Not on file  . Marital Status: Not on file     Observations/Objective: Appears well in NAD Normal mood and affect  Assessment and Plan:  See Problem List for Assessment and Plan of chronic medical problems.   Follow Up Instructions:    I discussed the assessment and treatment plan with the patient. The patient was provided an opportunity to ask questions and all were answered. The patient agreed  with the plan and demonstrated an understanding of the instructions.   The patient was advised to call back or seek an in-person evaluation if the symptoms worsen or if the condition fails to improve as anticipated.  Follow-up in 6 months  Binnie Rail, MD

## 2019-02-10 MED FILL — metroNIDAZOLE 0.75 % GEL: 0.75 | 5 days supply | Qty: 70 | Fill #0

## 2019-03-17 ENCOUNTER — Encounter: Payer: Self-pay | Admitting: Internal Medicine

## 2019-03-20 MED FILL — metroNIDAZOLE 0.75 % GEL: 0.75 | 5 days supply | Qty: 70 | Fill #0

## 2019-03-20 MED FILL — OVIDREL 250 MCG/0.5 ML SYRG: 250 | 29 days supply | Qty: 1 | Fill #0

## 2019-03-20 MED FILL — LETROZOLE 2.5 MG TABLET: 2.5 | 5 days supply | Qty: 15 | Fill #1

## 2019-03-29 DIAGNOSIS — Z319 Encounter for procreative management, unspecified: Secondary | ICD-10-CM | POA: Diagnosis not present

## 2019-03-29 DIAGNOSIS — E288 Other ovarian dysfunction: Secondary | ICD-10-CM | POA: Diagnosis not present

## 2019-03-30 ENCOUNTER — Encounter: Payer: Self-pay | Admitting: Internal Medicine

## 2019-04-04 ENCOUNTER — Ambulatory Visit: Payer: 59 | Admitting: Internal Medicine

## 2019-04-06 DIAGNOSIS — E288 Other ovarian dysfunction: Secondary | ICD-10-CM | POA: Diagnosis not present

## 2019-04-13 DIAGNOSIS — Y999 Unspecified external cause status: Secondary | ICD-10-CM | POA: Diagnosis not present

## 2019-04-13 DIAGNOSIS — Z3202 Encounter for pregnancy test, result negative: Secondary | ICD-10-CM | POA: Diagnosis not present

## 2019-04-13 DIAGNOSIS — M545 Low back pain: Secondary | ICD-10-CM | POA: Diagnosis not present

## 2019-04-13 DIAGNOSIS — R519 Headache, unspecified: Secondary | ICD-10-CM | POA: Diagnosis not present

## 2019-04-13 DIAGNOSIS — S3992XA Unspecified injury of lower back, initial encounter: Secondary | ICD-10-CM | POA: Diagnosis not present

## 2019-04-13 DIAGNOSIS — M542 Cervicalgia: Secondary | ICD-10-CM | POA: Diagnosis not present

## 2019-04-13 DIAGNOSIS — S0990XA Unspecified injury of head, initial encounter: Secondary | ICD-10-CM | POA: Diagnosis not present

## 2019-04-13 DIAGNOSIS — S39012A Strain of muscle, fascia and tendon of lower back, initial encounter: Secondary | ICD-10-CM | POA: Diagnosis not present

## 2019-04-13 DIAGNOSIS — M549 Dorsalgia, unspecified: Secondary | ICD-10-CM | POA: Diagnosis not present

## 2019-04-13 DIAGNOSIS — S6992XA Unspecified injury of left wrist, hand and finger(s), initial encounter: Secondary | ICD-10-CM | POA: Diagnosis not present

## 2019-04-13 DIAGNOSIS — S0993XA Unspecified injury of face, initial encounter: Secondary | ICD-10-CM | POA: Diagnosis not present

## 2019-04-13 DIAGNOSIS — M79642 Pain in left hand: Secondary | ICD-10-CM | POA: Diagnosis not present

## 2019-04-18 MED FILL — OVIDREL 250 MCG/0.5 ML SYRG: 250 | 29 days supply | Qty: 1 | Fill #1

## 2019-04-18 MED FILL — LETROZOLE 2.5 MG TABLET: 2.5 | 5 days supply | Qty: 15 | Fill #2

## 2019-04-27 DIAGNOSIS — Z319 Encounter for procreative management, unspecified: Secondary | ICD-10-CM | POA: Diagnosis not present

## 2019-04-28 DIAGNOSIS — F4322 Adjustment disorder with anxiety: Secondary | ICD-10-CM | POA: Diagnosis not present

## 2019-05-08 DIAGNOSIS — F4322 Adjustment disorder with anxiety: Secondary | ICD-10-CM | POA: Diagnosis not present

## 2019-05-19 ENCOUNTER — Encounter: Payer: Self-pay | Admitting: Internal Medicine

## 2019-05-19 NOTE — Telephone Encounter (Signed)
Please advise on mychart message. I will contact the pt back on Monday. She is aware you are out of the office.

## 2019-05-22 DIAGNOSIS — F4322 Adjustment disorder with anxiety: Secondary | ICD-10-CM | POA: Diagnosis not present

## 2019-05-23 ENCOUNTER — Encounter: Payer: Self-pay | Admitting: Internal Medicine

## 2019-05-24 ENCOUNTER — Encounter: Payer: Self-pay | Admitting: Internal Medicine

## 2019-06-05 DIAGNOSIS — F4322 Adjustment disorder with anxiety: Secondary | ICD-10-CM | POA: Diagnosis not present

## 2019-06-19 DIAGNOSIS — F4322 Adjustment disorder with anxiety: Secondary | ICD-10-CM | POA: Diagnosis not present

## 2019-06-19 MED FILL — BYSTOLIC 10 MG TABLET: 10 | 90 days supply | Qty: 90 | Fill #1

## 2019-06-26 MED FILL — FLUCONAZOLE 150 MG TABS: 150 | 3 days supply | Qty: 2 | Fill #0

## 2019-07-14 DIAGNOSIS — Z6834 Body mass index (BMI) 34.0-34.9, adult: Secondary | ICD-10-CM | POA: Diagnosis not present

## 2019-07-14 DIAGNOSIS — Z01419 Encounter for gynecological examination (general) (routine) without abnormal findings: Secondary | ICD-10-CM | POA: Diagnosis not present

## 2019-07-14 DIAGNOSIS — N979 Female infertility, unspecified: Secondary | ICD-10-CM | POA: Insufficient documentation

## 2019-07-14 DIAGNOSIS — E282 Polycystic ovarian syndrome: Secondary | ICD-10-CM | POA: Insufficient documentation

## 2019-07-14 DIAGNOSIS — I10 Essential (primary) hypertension: Secondary | ICD-10-CM | POA: Insufficient documentation

## 2019-07-23 NOTE — Progress Notes (Signed)
Subjective:    Patient ID: Candace [name], female    DOB: 06-Feb-1980, 39 y.o.   MRN: [id number]  HPI The patient is here for follow up of their chronic medical problems, including htn, prediabetes, obesity, ADD, OSA, vit d def  Work is stressful. She is not sleeping enough.  She has difficulty falling asleep - turning her head off- but once she is asleep she sleeps good.  She is not exercising.   She feels depression and anxious.  She plans on cutting back on her hours next month, which will help.  She is not currently exercising and knows she needs to.  She has gained weight.  She is not taking the vyvanse but she needs to.  She did not take it long enough to know how much it helped, but she thinks it did help some with her efficiency and focusing.  Medications and allergies reviewed with patient and updated if appropriate.  Patient Active Problem List   Diagnosis Date Noted  . ADD (attention deficit disorder) 06/22/2018  . Prediabetes 03/23/2017  . OSA (obstructive sleep apnea) 01/08/2017  . Obesity 06/26/2016  . Low back pain 09/23/2015  . Vitamin D deficiency 08/12/2015  . Alopecia 06/11/2014  . Multinodular thyroid 12/01/2011  . Insomnia 11/26/2011  . Cervical polyp 11/26/2011  . Eczema 11/26/2011  . Essential hypertension, benign 11/25/2011    Current Outpatient Medications on File Prior to Visit  Medication Sig Dispense Refill  . BYSTOLIC 10 MG tablet Take 10 mg by mouth daily.    Marland Kitchen labetalol (NORMODYNE) 200 MG tablet Take 1 tablet (200 mg total) by mouth 2 (two) times daily. 60 tablet 5  . metroNIDAZOLE (METROGEL) 0.75 % vaginal gel Place vaginally at bedtime.    . Multiple Vitamin (MULTIVITAMIN) capsule Take 1 capsule by mouth daily.    . NON FORMULARY MAG 07    . triamcinolone cream (KENALOG) 0.1 % Apply 1 application topically 2 (two) times daily as needed. Not more than 2 weeks at a time 30 g 5  . lisdexamfetamine (VYVANSE) 30 MG capsule Take 1 capsule (30 mg  total) by mouth daily. (Patient not taking: Reported on 07/25/2019) 30 capsule 0  . metFORMIN (GLUCOPHAGE) 1000 MG tablet metformin 1,000 mg tablet (Patient not taking: Reported on 07/25/2019)     No current facility-administered medications on file prior to visit.    Past Medical History:  Diagnosis Date  . Anxiety    no meds  . Depression    no meds  . Hypertension    does not take BP med regularly, instructed to take med nest 3 days.    Past Surgical History:  Procedure Laterality Date  . DILATION AND CURETTAGE OF UTERUS     x 2 polyps removed  . DILATION AND EVACUATION N/A 02/10/2013   Procedure: DILATATION AND EVACUATION;  Surgeon: Zelphia Cairo, MD;  Location: [facility];  Service: Gynecology;  Laterality: N/A;  with intra operative [place]  . SPINE SURGERY     patient denies having spine surgery  . WISDOM TOOTH EXTRACTION  03/2014    Social History   Socioeconomic History  . Marital status: Single    Spouse name: n/a  . Number of children: 0  . Years of education: Not on file  . Highest education level: Not on file  Occupational History  . Occupation: Ecologist    Comment: [employer]  Tobacco Use  . Smoking status: Never Smoker  . Smokeless tobacco: Never  Used  Vaping Use  . Vaping Use: Never used  Substance and Sexual Activity  . Alcohol use: Yes    Alcohol/week: 3.0 standard drinks    Types: 3 Standard drinks or equivalent per week    Comment: weekends - mixed drinks  . Drug use: No  . Sexual activity: Yes    Partners: Male    Birth control/protection: None  Other Topics Concern  . Not on file  Social History Narrative   Works in ED - behavioral assessments      Exercise:  none   Social Determinants of Health   Financial Resource Strain:   . Difficulty of Paying Living Expenses:   Food Insecurity:   . Worried About Charity fundraiser in the Last Year:   . Arboriculturist in the Last Year:   Transportation Needs:   . Film/video editor  (Medical):   Marland Kitchen Lack of Transportation (Non-Medical):   Physical Activity:   . Days of Exercise per Week:   . Minutes of Exercise per Session:   Stress:   . Feeling of Stress :   Social Connections:   . Frequency of Communication with Friends and Family:   . Frequency of Social Gatherings with Friends and Family:   . Attends Religious Services:   . Active Member of Clubs or Organizations:   . Attends Archivist Meetings:   Marland Kitchen Marital Status:     Family History  Problem Relation Age of Onset  . Diabetes Father   . Hypertension Father   . Hypertension Sister   . Hypertension Brother   . Diabetes Maternal Grandmother   . Hypertension Maternal Grandmother     Review of Systems  Constitutional: Negative for chills and fever.  Respiratory: Negative for cough, shortness of breath and wheezing.   Cardiovascular: Positive for leg swelling (improved after eating less salt). Negative for chest pain and palpitations.  Neurological: Positive for headaches. Negative for dizziness and light-headedness.  Psychiatric/Behavioral: Positive for dysphoric mood and sleep disturbance. Negative for suicidal ideas. The patient is nervous/anxious.        Objective:   Vitals:   07/25/19 0802  BP: 134/80  Pulse: 89  Temp: 98.3 F (36.8 C)  SpO2: 99%   BP Readings from Last 3 Encounters:  07/25/19 134/80  12/27/18 (!) 138/94  06/22/18 (!) 152/96   Wt Readings from Last 3 Encounters:  07/25/19 203 lb (92.1 kg)  12/27/18 199 lb 12.5 oz (90.6 kg)  06/22/18 196 lb 1.9 oz (89 kg)   Body mass index is 34.84 kg/m.   Physical Exam    Constitutional: Appears well-developed and well-nourished. No distress.  HENT:  Head: Normocephalic and atraumatic.  Neck: Neck supple. No tracheal deviation present. No thyromegaly present.  No cervical lymphadenopathy Cardiovascular: Normal rate, regular rhythm and normal heart sounds.   No murmur heard. No carotid bruit .  No  edema Pulmonary/Chest: Effort normal and breath sounds normal. No respiratory distress. No has no wheezes. No rales.  Skin: Skin is warm and dry. Not diaphoretic.  Psychiatric: Normal mood and affect. Behavior is normal.      Assessment & Plan:    See Problem List for Assessment and Plan of chronic medical problems.    This visit occurred during the SARS-CoV-2 public health emergency.  Safety protocols were in place, including screening questions prior to the visit, additional usage of staff PPE, and extensive cleaning of exam room while observing appropriate contact time as  indicated for disinfecting solutions.    

## 2019-07-23 NOTE — Patient Instructions (Addendum)
  Blood work was ordered.     Medications reviewed and updated.  Changes include :  Start vyvanse and lexapro   Your prescription(s) have been submitted to your pharmacy. Please take as directed and contact our office if you believe you are having problem(s) with the medication(s).     Please followup in 6 months

## 2019-07-25 ENCOUNTER — Other Ambulatory Visit: Payer: Self-pay

## 2019-07-25 ENCOUNTER — Encounter: Payer: Self-pay | Admitting: Internal Medicine

## 2019-07-25 ENCOUNTER — Ambulatory Visit (INDEPENDENT_AMBULATORY_CARE_PROVIDER_SITE_OTHER): Payer: 59 | Admitting: Internal Medicine

## 2019-07-25 VITALS — BP 134/80 | HR 89 | Temp 98.3°F | Ht 64.0 in | Wt 203.0 lb

## 2019-07-25 DIAGNOSIS — E6609 Other obesity due to excess calories: Secondary | ICD-10-CM | POA: Diagnosis not present

## 2019-07-25 DIAGNOSIS — F988 Other specified behavioral and emotional disorders with onset usually occurring in childhood and adolescence: Secondary | ICD-10-CM | POA: Diagnosis not present

## 2019-07-25 DIAGNOSIS — F419 Anxiety disorder, unspecified: Secondary | ICD-10-CM | POA: Diagnosis not present

## 2019-07-25 DIAGNOSIS — Z6832 Body mass index (BMI) 32.0-32.9, adult: Secondary | ICD-10-CM | POA: Diagnosis not present

## 2019-07-25 DIAGNOSIS — R7303 Prediabetes: Secondary | ICD-10-CM | POA: Diagnosis not present

## 2019-07-25 DIAGNOSIS — G4733 Obstructive sleep apnea (adult) (pediatric): Secondary | ICD-10-CM

## 2019-07-25 DIAGNOSIS — I10 Essential (primary) hypertension: Secondary | ICD-10-CM | POA: Diagnosis not present

## 2019-07-25 DIAGNOSIS — E559 Vitamin D deficiency, unspecified: Secondary | ICD-10-CM | POA: Diagnosis not present

## 2019-07-25 DIAGNOSIS — F32A Depression, unspecified: Secondary | ICD-10-CM | POA: Insufficient documentation

## 2019-07-25 DIAGNOSIS — F329 Major depressive disorder, single episode, unspecified: Secondary | ICD-10-CM

## 2019-07-25 LAB — COMPREHENSIVE METABOLIC PANEL
ALT: 11 U/L (ref 0–35)
AST: 13 U/L (ref 0–37)
Albumin: 4.2 g/dL (ref 3.5–5.2)
Alkaline Phosphatase: 57 U/L (ref 39–117)
BUN: 24 mg/dL — ABNORMAL HIGH (ref 6–23)
CO2: 26 mEq/L (ref 19–32)
Calcium: 9.1 mg/dL (ref 8.4–10.5)
Chloride: 105 mEq/L (ref 96–112)
Creatinine, Ser: 0.8 mg/dL (ref 0.40–1.20)
GFR: 96.75 mL/min (ref >60.00)
Glucose, Bld: 100 mg/dL — ABNORMAL HIGH (ref 70–99)
Potassium: 3.9 mEq/L (ref 3.5–5.1)
Sodium: 137 mEq/L (ref 135–145)
Total Bilirubin: 0.3 mg/dL (ref 0.2–1.2)
Total Protein: 7.3 g/dL (ref 6.0–8.3)

## 2019-07-25 LAB — VITAMIN D 25 HYDROXY (VIT D DEFICIENCY, FRACTURES): VITD: 13.66 ng/mL — ABNORMAL LOW (ref 30.00–100.00)

## 2019-07-25 LAB — HEMOGLOBIN A1C: Hgb A1c MFr Bld: 5.8 % (ref 4.6–6.5)

## 2019-07-25 MED ORDER — LISDEXAMFETAMINE DIMESYLATE 30 MG PO CAPS: 30.0000 mg | ORAL_CAPSULE | Freq: Every day | ORAL | 0 refills | Status: DC

## 2019-07-25 MED ORDER — ESCITALOPRAM OXALATE 10 MG PO TABS: 10.0000 mg | ORAL_TABLET | Freq: Every day | ORAL | 1 refills | Status: DC

## 2019-07-25 MED FILL — VYVANSE 30 MG CAPSULE: 30 | 30 days supply | Qty: 30 | Fill #0

## 2019-07-25 MED FILL — ESCITALOPRAM 10 MG TABLET: 10 | 90 days supply | Qty: 90 | Fill #0

## 2019-07-25 NOTE — Assessment & Plan Note (Signed)
Chronic Check a1c Low sugar / carb diet Stressed regular exercise  

## 2019-07-25 NOTE — Assessment & Plan Note (Signed)
Chronic Vitamin D level low Not currently taking daily vitamin D Recheck level Advised daily vitamin D-we will advise on amount after blood work

## 2019-07-25 NOTE — Assessment & Plan Note (Signed)
Chronic Not always using CPAP-understands the importance of using it regularly-does have morning headaches sometimes Work on weight loss

## 2019-07-25 NOTE — Assessment & Plan Note (Signed)
New problem - h/o of both, but now having increased anxiety and depression She is interested in starting medication I believe that will help her Start Lexapro 10 mg daily-she was on this in the past, but did not take it long-term Stressed regular exercise She will be decreasing her work hours and I think that will also help with some anxiety and depression

## 2019-07-25 NOTE — Assessment & Plan Note (Signed)
Chronic Will work on improving anxiety, depression and sleep We will be cutting back on her hours at work, which will help allow more time for exercise Stressed regular exercise Healthy diet, decrease portions Follow-up in 6 months

## 2019-07-25 NOTE — Assessment & Plan Note (Signed)
Chronic Did try Vyvanse and it seemed to help, but did not continue to take it you think she needs it Restart Vyvanse daily-prescription sent to pharmacy

## 2019-07-25 NOTE — Assessment & Plan Note (Signed)
Chronic Blood pressure well controlled here today Currently taking Bystolic, but is considering fertility treatment to become pregnant and we will switch to labetalol prior to that Okay to continue Bystolic for now CMP

## 2019-07-26 ENCOUNTER — Encounter: Payer: Self-pay | Admitting: Internal Medicine

## 2019-08-09 DIAGNOSIS — F4322 Adjustment disorder with anxiety: Secondary | ICD-10-CM | POA: Diagnosis not present

## 2019-09-11 ENCOUNTER — Telehealth (INDEPENDENT_AMBULATORY_CARE_PROVIDER_SITE_OTHER): Payer: 59 | Admitting: Family

## 2019-09-11 DIAGNOSIS — Z20822 Contact with and (suspected) exposure to covid-19: Secondary | ICD-10-CM

## 2019-09-11 NOTE — Progress Notes (Signed)
Candace [name] is a 39 y.o. female with the following history as recorded in Epic[facility]:  Patient Active Problem List   Diagnosis Date Noted   Anxiety and depression 07/25/2019   ADD (attention deficit disorder) 06/22/2018   Prediabetes 03/23/2017   OSA (obstructive sleep apnea) 01/08/2017   Obesity 06/26/2016   Low back pain 09/23/2015   Vitamin D deficiency 08/12/2015   Alopecia 06/11/2014   Multinodular thyroid 12/01/2011   Insomnia 11/26/2011   Cervical polyp 11/26/2011   Eczema 11/26/2011   Essential hypertension, benign 11/25/2011    Current Outpatient Medications  Medication Sig Dispense Refill   BYSTOLIC 10 MG tablet Take 10 mg by mouth daily.     escitalopram (LEXAPRO) 10 MG tablet Take 1 tablet (10 mg total) by mouth daily. 90 tablet 1   labetalol (NORMODYNE) 200 MG tablet Take 1 tablet (200 mg total) by mouth 2 (two) times daily. 60 tablet 5   lisdexamfetamine (VYVANSE) 30 MG capsule Take 1 capsule (30 mg total) by mouth daily. 30 capsule 0   metroNIDAZOLE (METROGEL) 0.75 % vaginal gel Place vaginally at bedtime.     Multiple Vitamin (MULTIVITAMIN) capsule Take 1 capsule by mouth daily.     NON FORMULARY MAG 07     triamcinolone cream (KENALOG) 0.1 % Apply 1 application topically 2 (two) times daily as needed. Not more than 2 weeks at a time 30 g 5   No current facility-administered medications for this visit.    Allergies: Amlodipine  Past Medical History:  Diagnosis Date   Anxiety    no meds   Depression    no meds   Hypertension    does not take BP med regularly, instructed to take med nest 3 days.    Past Surgical History:  Procedure Laterality Date   DILATION AND CURETTAGE OF UTERUS     x 2 polyps removed   DILATION AND EVACUATION N/A 02/10/2013   Procedure: DILATATION AND EVACUATION;  Surgeon: Zelphia Cairo, MD;  Location: [facility];  Service: Gynecology;  Laterality: N/A;  with intra operative [place]   SPINE SURGERY     patient  denies having spine surgery   WISDOM TOOTH EXTRACTION  03/2014    Family History  Problem Relation Age of Onset   Diabetes Father    Hypertension Father    Hypertension Sister    Hypertension Brother    Diabetes Maternal Grandmother    Hypertension Maternal Grandmother     Social History   Tobacco Use   Smoking status: Never Smoker   Smokeless tobacco: Never Used  Substance Use Topics   Alcohol use: Yes    Alcohol/week: 3.0 standard drinks    Types: 3 Standard drinks or equivalent per week    Comment: weekends - mixed drinks    Subjective:    I connected with [name] on 09/11/19 at  2:20 PM EDT by a video enabled telemedicine application and verified that I am speaking with the correct person using two identifiers.   I discussed the limitations of evaluation and management by telemedicine and the availability of in person appointments. The patient expressed understanding and agreed to proceed. Provider in office/ patient is at home; provider and patient are only 2 people on video call.   Notes that her significant other tested positive for COVID over a week ago; she began experiencing similar symptoms soon after including body aches/ weakness, night sweats, sore throat; denies any difficulty breathing; has been taking OTC Coricidin, Robitussin,  Nyquil; she is concerned that her symptoms are actually worsening;    Objective:  There were no vitals filed for this visit.  General: Well developed, well nourished, in no acute distress  Head: Normocephalic and atraumatic  Lungs: Respirations unlabored;  Neurologic: Alert and oriented; speech intact; face symmetrical; moves all extremities well; CNII-XII intact without focal deficit  Assessment:  1. Suspected COVID-19 virus infection     Plan:  Recommended in office/ in person evaluation at ER or [facility]; may need CXR and labs; increase fluids, rest and follow-up to be determined;  No follow-ups on file.  No orders  of the defined types were placed in this encounter.   Requested Prescriptions    No prescriptions requested or ordered in this encounter

## 2019-09-13 ENCOUNTER — Ambulatory Visit
Admission: EM | Admit: 2019-09-13 | Discharge: 2019-09-13 | Disposition: A | Discharge status: Home / Self Care | Payer: 59 | Attending: Physician Assistant | Admitting: Physician Assistant

## 2019-09-13 ENCOUNTER — Encounter: Payer: Self-pay | Admitting: Physician Assistant

## 2019-09-13 DIAGNOSIS — R432 Parageusia: Secondary | ICD-10-CM | POA: Diagnosis not present

## 2019-09-13 DIAGNOSIS — R059 Cough, unspecified: Secondary | ICD-10-CM

## 2019-09-13 DIAGNOSIS — R05 Cough: Secondary | ICD-10-CM | POA: Diagnosis not present

## 2019-09-13 DIAGNOSIS — R509 Fever, unspecified: Secondary | ICD-10-CM

## 2019-09-13 DIAGNOSIS — Z20822 Contact with and (suspected) exposure to covid-19: Secondary | ICD-10-CM

## 2019-09-13 MED ORDER — ALBUTEROL SULFATE HFA 108 (90 BASE) MCG/ACT IN AERS: 1.0000 | INHALATION_SPRAY | Freq: Four times a day (QID) | RESPIRATORY_TRACT | 0 refills | Status: DC | PRN

## 2019-09-13 MED ORDER — FLUTICASONE PROPIONATE 50 MCG/ACT NA SUSP
2.0000 | Freq: Every day | NASAL | 0 refills | Status: DC
Start: 2019-09-13 — End: 2020-02-05

## 2019-09-13 MED ORDER — BENZONATATE 200 MG PO CAPS
200.0000 mg | ORAL_CAPSULE | Freq: Three times a day (TID) | ORAL | 0 refills | Status: DC
Start: 2019-09-13 — End: 2020-01-23

## 2019-09-13 NOTE — ED Provider Notes (Signed)
[facility]    CSN: [id number] Arrival date & time: 09/13/19  1820      History   Chief Complaint Chief Complaint  Patient presents with  . Cough  . Fever    HPI Candace [name] is a 39 y.o. female.   39 year old female comes in for 5 day of URI symptoms. Headache, rhinorrhea, cough, fatigue, diarrhea, loss of taste, dyspnea on exertion. Fever tmax 103 with sweats, responsive to antipyretic. Denies abdominal pain, nausea, vomiting. Denies shortness of breath.  No COVID vaccine. Never smoker.      Past Medical History:  Diagnosis Date  . Anxiety    no meds  . Depression    no meds  . Hypertension    does not take BP med regularly, instructed to take med nest 3 days.    Patient Active Problem List   Diagnosis Date Noted  . Anxiety and depression 07/25/2019  . ADD (attention deficit disorder) 06/22/2018  . Prediabetes 03/23/2017  . OSA (obstructive sleep apnea) 01/08/2017  . Obesity 06/26/2016  . Low back pain 09/23/2015  . Vitamin D deficiency 08/12/2015  . Alopecia 06/11/2014  . Multinodular thyroid 12/01/2011  . Insomnia 11/26/2011  . Cervical polyp 11/26/2011  . Eczema 11/26/2011  . Essential hypertension, benign 11/25/2011    Past Surgical History:  Procedure Laterality Date  . DILATION AND CURETTAGE OF UTERUS     x 2 polyps removed  . DILATION AND EVACUATION N/A 02/10/2013   Procedure: DILATATION AND EVACUATION;  Surgeon: Zelphia Cairo, MD;  Location: [facility];  Service: Gynecology;  Laterality: N/A;  with intra operative [place]  . SPINE SURGERY     patient denies having spine surgery  . WISDOM TOOTH EXTRACTION  03/2014    OB History    Gravida  3   Para      Term      Preterm      AB  1   Living        SAB  1   TAB      Ectopic      Multiple      Live Births               Home Medications    Prior to Admission medications   Medication Sig Start Date End Date Taking? Authorizing Provider  albuterol (VENTOLIN HFA)  108 (90 Base) MCG/ACT inhaler Inhale 1-2 puffs into the lungs every 6 (six) hours as needed for wheezing or shortness of breath. 09/13/19   Cathie Hoops, Arda Daggs V, PA-C  benzonatate (TESSALON) 200 MG capsule Take 1 capsule (200 mg total) by mouth every 8 (eight) hours. 09/13/19   Maliq Pilley V, PA-C  BYSTOLIC 10 MG tablet Take 10 mg by mouth daily. 06/19/19   [provider]  escitalopram (LEXAPRO) 10 MG tablet Take 1 tablet (10 mg total) by mouth daily. 07/25/19   Pincus Sanes, MD  fluticasone (FLONASE) 50 MCG/ACT nasal spray Place 2 sprays into both nostrils daily. 09/13/19   Cathie Hoops, Sincerity Cedar V, PA-C  labetalol (NORMODYNE) 200 MG tablet Take 1 tablet (200 mg total) by mouth 2 (two) times daily. 12/27/18   Pincus Sanes, MD  lisdexamfetamine (VYVANSE) 30 MG capsule Take 1 capsule (30 mg total) by mouth daily. 07/25/19   Pincus Sanes, MD  metroNIDAZOLE (METROGEL) 0.75 % vaginal gel Place vaginally at bedtime. 03/20/19   [provider]  Multiple Vitamin (MULTIVITAMIN) capsule Take 1 capsule by mouth daily. 12/27/18  Pincus Sanes, MD  NON FORMULARY MAG [phone]    [provider]  triamcinolone cream (KENALOG) 0.1 % Apply 1 application topically 2 (two) times daily as needed. Not more than 2 weeks at a time 06/22/18   Pincus Sanes, MD    Family History Family History  Problem Relation Age of Onset  . Diabetes Father   . Hypertension Father   . Hypertension Sister   . Hypertension Brother   . Diabetes Maternal Grandmother   . Hypertension Maternal Grandmother     Social History Social History   Tobacco Use  . Smoking status: Never Smoker  . Smokeless tobacco: Never Used  Vaping Use  . Vaping Use: Never used  Substance Use Topics  . Alcohol use: Yes    Alcohol/week: 3.0 standard drinks    Types: 3 Standard drinks or equivalent per week    Comment: weekends - mixed drinks  . Drug use: No     Allergies   Amlodipine   Review of Systems Review of Systems   Physical Exam Triage  Vital Signs ED Triage Vitals [09/13/19 1935]  Enc Vitals Group     BP (!) 126/92     Pulse Rate (!) 107     Resp 18     Temp 98.9 F (37.2 C)     Temp Source Oral     SpO2 99 %     Weight      Height      Head Circumference      Peak Flow      Pain Score      Pain Loc      Pain Edu?      Excl. in GC?    No data found.  Updated Vital Signs BP (!) 126/92 (BP Location: Left Arm)   Pulse (!) 107   Temp 98.9 F (37.2 C) (Oral)   Resp 18   LMP 08/19/2019 (Approximate)   SpO2 99%   Physical Exam Constitutional:      General: She is not in acute distress.    Appearance: Normal appearance. She is not ill-appearing, toxic-appearing or diaphoretic.  HENT:     Head: Normocephalic and atraumatic.     Mouth/Throat:     Mouth: Mucous membranes are moist.     Pharynx: Oropharynx is clear. Uvula midline.  Cardiovascular:     Rate and Rhythm: Normal rate and regular rhythm.     Heart sounds: Normal heart sounds. No murmur heard.  No friction rub. No gallop.   Pulmonary:     Effort: Pulmonary effort is normal. No accessory muscle usage, prolonged expiration, respiratory distress or retractions.     Comments: Lungs clear to auscultation without adventitious lung sounds. Musculoskeletal:     Cervical back: Normal range of motion and neck supple.  Neurological:     General: No focal deficit present.     Mental Status: She is alert and oriented to person, place, and time.      UC Treatments / Results  Labs (all labs ordered are listed, but only abnormal results are displayed) Labs Reviewed  NOVEL CORONAVIRUS, NAA    EKG   Radiology No results found.  Procedures Procedures (including critical care time)  Medications Ordered in UC Medications - No data to display  Initial Impression / Assessment and Plan / UC Course  I have reviewed the triage vital signs and the nursing notes.  Pertinent labs & imaging results that were available during my care of  the patient were  reviewed by me and considered in my medical decision making (see chart for details).    COVID PCR test ordered.  Suspect Covid, and would like patient to quarantine.  Patient will need to contact health at work for further directions.  No alarming signs on exam.  LCTAB. Symptomatic treatment discussed.  Push fluids.  Return precautions given.  Patient expresses understanding and agrees to plan.  Final Clinical Impressions(s) / UC Diagnoses   Final diagnoses:  Suspected COVID-19 virus infection  Loss of taste  Cough  Fever, unspecified    ED Prescriptions    Medication Sig Dispense Auth. Provider   benzonatate (TESSALON) 200 MG capsule Take 1 capsule (200 mg total) by mouth every 8 (eight) hours. 21 capsule Lashay Osborne V, PA-C   fluticasone (FLONASE) 50 MCG/ACT nasal spray Place 2 sprays into both nostrils daily. 1 g Mali Eppard V, PA-C   albuterol (VENTOLIN HFA) 108 (90 Base) MCG/ACT inhaler Inhale 1-2 puffs into the lungs every 6 (six) hours as needed for wheezing or shortness of breath. 8 g Belinda Fisher, PA-C     PDMP not reviewed this encounter.   Belinda Fisher, PA-C 09/13/19 2046

## 2019-09-13 NOTE — Discharge Instructions (Signed)
COVID PCR testing ordered. Quarantine for now and contact health at work for further instructions. Tessalon for cough. Albuterol for shortness of breath. Start flonase nasal spray for nasal congestion/drainage. You can use over the counter nasal saline rinse such as neti pot for nasal congestion. Keep hydrated, your urine should be clear to pale yellow in color. Tylenol/motrin for fever and pain. Monitor for any worsening of symptoms, chest pain, worsening shortness of breath, go to the emergency department for further evaluation needed.

## 2019-09-13 NOTE — ED Triage Notes (Signed)
Pt c/o congestion, runny nose, productive cough with green sputum, fever with Tmax 103, sore throat, fatigue, diarrhea for approx 3-5 days. Has been taking tylenol and coricidin for symptoms and pushing fluids.  Denies n/v, SOB, abdominal pain.

## 2019-09-14 MED FILL — FLUTICASONE PROP 50 MCG SPR: 50 | 30 days supply | Qty: 16 | Fill #0

## 2019-09-14 MED FILL — BENZONATATE 200 MG CAP: 200 | 7 days supply | Qty: 21 | Fill #0

## 2019-09-14 MED FILL — ALBUTEROL SULFATE HFA 108 (: 108 (90 BAS | 25 days supply | Qty: 18 | Fill #0

## 2019-09-15 LAB — SARS-COV-2, NAA 2 DAY TAT

## 2019-09-15 LAB — NOVEL CORONAVIRUS, NAA: SARS-CoV-2, NAA: DETECTED — AB

## 2019-09-16 ENCOUNTER — Encounter (HOSPITAL_BASED_OUTPATIENT_CLINIC_OR_DEPARTMENT_OTHER): Payer: Self-pay | Admitting: *Deleted

## 2019-09-16 ENCOUNTER — Other Ambulatory Visit: Payer: Self-pay

## 2019-09-16 ENCOUNTER — Emergency Department (HOSPITAL_BASED_OUTPATIENT_CLINIC_OR_DEPARTMENT_OTHER)
Admission: EM | Admit: 2019-09-16 | Discharge: 2019-09-17 | Disposition: A | Discharge status: Home / Self Care | Payer: 59 | Attending: Emergency Medicine | Admitting: Emergency Medicine

## 2019-09-16 ENCOUNTER — Emergency Department (HOSPITAL_BASED_OUTPATIENT_CLINIC_OR_DEPARTMENT_OTHER): Payer: 59

## 2019-09-16 DIAGNOSIS — M791 Myalgia, unspecified site: Secondary | ICD-10-CM | POA: Insufficient documentation

## 2019-09-16 DIAGNOSIS — U071 COVID-19: Secondary | ICD-10-CM | POA: Diagnosis not present

## 2019-09-16 DIAGNOSIS — R05 Cough: Secondary | ICD-10-CM | POA: Insufficient documentation

## 2019-09-16 DIAGNOSIS — F909 Attention-deficit hyperactivity disorder, unspecified type: Secondary | ICD-10-CM | POA: Insufficient documentation

## 2019-09-16 DIAGNOSIS — R0602 Shortness of breath: Secondary | ICD-10-CM | POA: Diagnosis present

## 2019-09-16 DIAGNOSIS — R509 Fever, unspecified: Secondary | ICD-10-CM | POA: Insufficient documentation

## 2019-09-16 DIAGNOSIS — I10 Essential (primary) hypertension: Secondary | ICD-10-CM | POA: Insufficient documentation

## 2019-09-16 DIAGNOSIS — R059 Cough, unspecified: Secondary | ICD-10-CM

## 2019-09-16 DIAGNOSIS — J90 Pleural effusion, not elsewhere classified: Secondary | ICD-10-CM | POA: Diagnosis not present

## 2019-09-16 MED ORDER — ACETAMINOPHEN 325 MG PO TABS: 650.0000 mg | ORAL_TABLET | Freq: Once | ORAL | Status: AC | PRN

## 2019-09-16 MED ORDER — ACETAMINOPHEN 325 MG PO TABS
ORAL_TABLET | ORAL | Status: AC
  Administered 2019-09-16: 650 mg via ORAL
  Filled 2019-09-16: qty 2

## 2019-09-16 NOTE — ED Triage Notes (Addendum)
Pt reports increased SOB this evening. + Covid test results received over 1 week ago

## 2019-09-17 DIAGNOSIS — J90 Pleural effusion, not elsewhere classified: Secondary | ICD-10-CM | POA: Diagnosis not present

## 2019-09-17 MED ORDER — PREDNISONE 20 MG PO TABS
40.0000 mg | ORAL_TABLET | Freq: Every day | ORAL | 0 refills | Status: AC
Start: 2019-09-17 — End: 2019-09-22

## 2019-09-17 MED ORDER — ALBUTEROL SULFATE HFA 108 (90 BASE) MCG/ACT IN AERS: 1.0000 | INHALATION_SPRAY | Freq: Four times a day (QID) | RESPIRATORY_TRACT | 0 refills | Status: DC | PRN

## 2019-09-17 NOTE — Discharge Instructions (Signed)
You were seen in the emergency department today with shortness of breath related to your COVID-19.  I have called in prescriptions for steroid and albuterol.  Return to the emergency department with any chest pain, shortness of breath, new or worsening symptoms.  Please stay isolated in quarantine until your symptoms have resolved for at least 3 days.  You can follow-up with your primary care doctor via telehealth.

## 2019-09-17 NOTE — ED Notes (Signed)
Discharge reviewed.  Waiting for her ride.

## 2019-09-17 NOTE — ED Notes (Signed)
Pt ambulated to room on room air. Pulse ox 95%

## 2019-09-17 NOTE — ED Provider Notes (Signed)
Emergency Department Provider Note   I have reviewed the triage vital signs and the nursing notes.   HISTORY  Chief Complaint Shortness of Breath (Covid +)   HPI Candace [name] is a 39 y.o. female with past medical history reviewed below including recent COVID-19 diagnosis  presents to the emergency department with increased shortness of breath.  Patient has had greater than 10 days of symptoms at the time of ED presentation.  She reports some associated cough with body aches and fever.  She is having some chest discomfort but only with coughing.  She is feeling some increased shortness of breath this weekend.  Patient has fairly minimal abdominal symptoms with mild diarrhea but no other acute symptoms.   Past Medical History:  Diagnosis Date  . Anxiety    no meds  . Depression    no meds  . Hypertension    does not take BP med regularly, instructed to take med nest 3 days.    Patient Active Problem List   Diagnosis Date Noted  . Anxiety and depression 07/25/2019  . ADD (attention deficit disorder) 06/22/2018  . Prediabetes 03/23/2017  . OSA (obstructive sleep apnea) 01/08/2017  . Obesity 06/26/2016  . Low back pain 09/23/2015  . Vitamin D deficiency 08/12/2015  . Alopecia 06/11/2014  . Multinodular thyroid 12/01/2011  . Insomnia 11/26/2011  . Cervical polyp 11/26/2011  . Eczema 11/26/2011  . Essential hypertension, benign 11/25/2011    Past Surgical History:  Procedure Laterality Date  . DILATION AND CURETTAGE OF UTERUS     x 2 polyps removed  . DILATION AND EVACUATION N/A 02/10/2013   Procedure: DILATATION AND EVACUATION;  Surgeon: Zelphia Cairo, MD;  Location: [facility];  Service: Gynecology;  Laterality: N/A;  with intra operative [place]  . SPINE SURGERY     patient denies having spine surgery  . WISDOM TOOTH EXTRACTION  03/2014    Allergies Amlodipine  Family History  Problem Relation Age of Onset  . Diabetes Father   . Hypertension Father   .  Hypertension Sister   . Hypertension Brother   . Diabetes Maternal Grandmother   . Hypertension Maternal Grandmother     Social History Social History   Tobacco Use  . Smoking status: Never Smoker  . Smokeless tobacco: Never Used  Vaping Use  . Vaping Use: Never used  Substance Use Topics  . Alcohol use: Yes    Alcohol/week: 3.0 standard drinks    Types: 3 Standard drinks or equivalent per week    Comment: weekends - mixed drinks  . Drug use: No    Review of Systems  Constitutional: Positive fever/chills Eyes: No visual changes. ENT: No sore throat. Cardiovascular: Denies chest pain. Respiratory: Positive shortness of breath. Gastrointestinal: No abdominal pain.  No nausea, no vomiting.  No diarrhea.  No constipation. Genitourinary: Negative for dysuria. Musculoskeletal: Negative for back pain. Positive body aches.  Skin: Negative for rash. Neurological: Negative for focal weakness or numbness. Positive HA.   10-point ROS otherwise negative.  ____________________________________________   PHYSICAL EXAM:  VITAL SIGNS: ED Triage Vitals  Enc Vitals Group     BP 09/16/19 2336 115/82     Pulse Rate 09/16/19 2336 (!) 111     Resp 09/16/19 2336 20     Temp 09/16/19 2336 (!) 101.4 F (38.6 C)     Temp Source 09/16/19 2336 Oral     SpO2 09/16/19 2336 93 %     Weight 09/16/19 2320 194 lb (  88 kg)     Height 09/16/19 2320 5\' 4"  (1.626 m)   Constitutional: Alert and oriented. Well appearing and in no acute distress. Eyes: Conjunctivae are normal.  Head: Atraumatic. Nose: No congestion/rhinnorhea. Mouth/Throat: Mucous membranes are moist.   Neck: No stridor.   Cardiovascular: Tachycardia. Good peripheral circulation. Grossly normal heart sounds.   Respiratory: Normal respiratory effort.  No retractions. Lungs CTAB. Gastrointestinal: Soft and nontender. No distention.  Musculoskeletal: No gross deformities of extremities. Neurologic:  Normal speech and language.    Skin:  Skin is warm, dry and intact. No rash noted.  ____________________________________________  RADIOLOGY  CXR reviewed.  ____________________________________________   PROCEDURES  Procedure(s) performed:   Procedures  None  ____________________________________________   INITIAL IMPRESSION / ASSESSMENT AND PLAN / ED COURSE  Pertinent labs & imaging results that were available during my care of the patient were reviewed by me and considered in my medical decision making (see chart for details).   Patient presents emergency department for evaluation of shortness of breath in the setting of COVID-19.  She arrives with tachycardia but normal oxygen saturation.  She is ambulatory here with sats not dropping below 95.  No increased work of breathing.  Her chest x-ray shows developing pneumonitis consistent with COVID-19.  Patient is unfortunately outside the window to benefit from monoclonal antibody therapy with greater than 10 days of symptoms.  Plan for steroid along with albuterol inhaler which were called into the pharmacy.  Discussed strict ED return precautions as well as telehealth follow-up with her PCP.   ____________________________________________  FINAL CLINICAL IMPRESSION(S) / ED DIAGNOSES  Final diagnoses:  COVID-19  SOB (shortness of breath)     MEDICATIONS GIVEN DURING THIS VISIT:  Medications  acetaminophen (TYLENOL) tablet 650 mg (650 mg Oral Given 09/16/19 2333)     NEW OUTPATIENT MEDICATIONS STARTED DURING THIS VISIT:  Discharge Medication List as of 09/17/2019 12:55 AM    START taking these medications   Details  predniSONE (DELTASONE) 20 MG tablet Take 2 tablets (40 mg total) by mouth daily for 5 days., Starting Sun 09/17/2019, Until Fri 09/22/2019, Normal        Note:  This document was prepared using Dragon voice recognition software and may include unintentional dictation errors.  09/24/2019, MD, [facility]    Loucille Takach,  [facility], MD 09/19/19 [phone]

## 2019-09-18 ENCOUNTER — Encounter: Payer: Self-pay | Admitting: Internal Medicine

## 2019-09-18 MED FILL — predniSONE 20 MG TABS: 20 | 5 days supply | Qty: 10 | Fill #0

## 2019-09-26 DIAGNOSIS — Z0279 Encounter for issue of other medical certificate: Secondary | ICD-10-CM

## 2019-09-26 NOTE — Telephone Encounter (Signed)
Spoke with patient and informed I received FMLA via fax. Start of leave 08/09 - Return to work on 08/26.  Forms have been completed &Placed in providers box to review and sign.

## 2019-09-27 NOTE — Telephone Encounter (Signed)
Forms have been signed, Faxed, Copy sent to scan &Charged for.   LVM to inform patient. &That original has been mailed to her, so she can have a copy for her records.

## 2019-10-13 MED FILL — FUROSEMIDE 20 MG TABS: 20 | 30 days supply | Qty: 30 | Fill #0

## 2019-11-22 DIAGNOSIS — G4733 Obstructive sleep apnea (adult) (pediatric): Secondary | ICD-10-CM | POA: Diagnosis not present

## 2019-12-06 ENCOUNTER — Other Ambulatory Visit (HOSPITAL_COMMUNITY): Payer: Self-pay | Admitting: Obstetrics and Gynecology

## 2019-12-06 MED FILL — metroNIDAZOLE 0.75 % GEL: 0.75 | 5 days supply | Qty: 70 | Fill #0

## 2020-01-12 ENCOUNTER — Other Ambulatory Visit (HOSPITAL_COMMUNITY): Payer: Self-pay | Admitting: Optometry

## 2020-01-12 DIAGNOSIS — H52223 Regular astigmatism, bilateral: Secondary | ICD-10-CM | POA: Diagnosis not present

## 2020-01-12 DIAGNOSIS — H5213 Myopia, bilateral: Secondary | ICD-10-CM | POA: Diagnosis not present

## 2020-01-12 MED FILL — LOTEPREDNOL ETABONATE 0.5 %: 0.5 | 14 days supply | Qty: 5 | Fill #0

## 2020-01-12 MED FILL — metroNIDAZOLE 0.75 % GEL: 0.75 | 5 days supply | Qty: 70 | Fill #0

## 2020-01-19 DIAGNOSIS — H33322 Round hole, left eye: Secondary | ICD-10-CM | POA: Diagnosis not present

## 2020-01-19 DIAGNOSIS — H43823 Vitreomacular adhesion, bilateral: Secondary | ICD-10-CM | POA: Diagnosis not present

## 2020-01-19 DIAGNOSIS — H35033 Hypertensive retinopathy, bilateral: Secondary | ICD-10-CM | POA: Diagnosis not present

## 2020-01-23 NOTE — Progress Notes (Signed)
Subjective:    Patient ID: Candace [name], female    DOB: 1980/06/26, 39 y.o.   MRN: [id number]  HPI The patient is here for follow up of their chronic medical problems, including htn, prediabetes, obesity, ADD, anxiety, depression, OSA, vitamin d def    Medications and allergies reviewed with patient and updated if appropriate.  Patient Active Problem List   Diagnosis Date Noted  . Anxiety and depression 07/25/2019  . ADD (attention deficit disorder) 06/22/2018  . Prediabetes 03/23/2017  . OSA (obstructive sleep apnea) 01/08/2017  . Obesity 06/26/2016  . Low back pain 09/23/2015  . Vitamin D deficiency 08/12/2015  . Alopecia 06/11/2014  . Multinodular thyroid 12/01/2011  . Insomnia 11/26/2011  . Cervical polyp 11/26/2011  . Eczema 11/26/2011  . Essential hypertension, benign 11/25/2011    Current Outpatient Medications on File Prior to Visit  Medication Sig Dispense Refill  . albuterol (VENTOLIN HFA) 108 (90 Base) MCG/ACT inhaler Inhale 1-2 puffs into the lungs every 6 (six) hours as needed for wheezing or shortness of breath. 6.7 g 0  . benzonatate (TESSALON) 200 MG capsule Take 1 capsule (200 mg total) by mouth every 8 (eight) hours. 21 capsule 0  . BYSTOLIC 10 MG tablet Take 10 mg by mouth daily.    Marland Kitchen escitalopram (LEXAPRO) 10 MG tablet Take 1 tablet (10 mg total) by mouth daily. 90 tablet 1  . fluticasone (FLONASE) 50 MCG/ACT nasal spray Place 2 sprays into both nostrils daily. 1 g 0  . labetalol (NORMODYNE) 200 MG tablet Take 1 tablet (200 mg total) by mouth 2 (two) times daily. 60 tablet 5  . lisdexamfetamine (VYVANSE) 30 MG capsule Take 1 capsule (30 mg total) by mouth daily. 30 capsule 0  . metroNIDAZOLE (METROGEL) 0.75 % vaginal gel Place vaginally at bedtime.    . Multiple Vitamin (MULTIVITAMIN) capsule Take 1 capsule by mouth daily.    . NON FORMULARY MAG 07    . triamcinolone cream (KENALOG) 0.1 % Apply 1 application topically 2 (two) times daily as needed. Not  more than 2 weeks at a time 30 g 5   No current facility-administered medications on file prior to visit.    Past Medical History:  Diagnosis Date  . Anxiety    no meds  . Depression    no meds  . Hypertension    does not take BP med regularly, instructed to take med nest 3 days.    Past Surgical History:  Procedure Laterality Date  . DILATION AND CURETTAGE OF UTERUS     x 2 polyps removed  . DILATION AND EVACUATION N/A 02/10/2013   Procedure: DILATATION AND EVACUATION;  Surgeon: Zelphia Cairo, MD;  Location: [facility];  Service: Gynecology;  Laterality: N/A;  with intra operative [place]  . SPINE SURGERY     patient denies having spine surgery  . WISDOM TOOTH EXTRACTION  03/2014    Social History   Socioeconomic History  . Marital status: Single    Spouse name: n/a  . Number of children: 0  . Years of education: Not on file  . Highest education level: Not on file  Occupational History  . Occupation: Ecologist    Comment: [place]  Tobacco Use  . Smoking status: Never Smoker  . Smokeless tobacco: Never Used  Vaping Use  . Vaping Use: Never used  Substance and Sexual Activity  . Alcohol use: Yes    Alcohol/week: 3.0 standard drinks    Types: 3  Standard drinks or equivalent per week    Comment: weekends - mixed drinks  . Drug use: No  . Sexual activity: Yes    Partners: Male    Birth control/protection: None  Other Topics Concern  . Not on file  Social History Narrative   Works in ED - behavioral assessments      Exercise:  none   Social Determinants of Health   Financial Resource Strain: Not on file  Food Insecurity: Not on file  Transportation Needs: Not on file  Physical Activity: Not on file  Stress: Not on file  Social Connections: Not on file    Family History  Problem Relation Age of Onset  . Diabetes Father   . Hypertension Father   . Hypertension Sister   . Hypertension Brother   . Diabetes Maternal Grandmother   . Hypertension  Maternal Grandmother     Review of Systems     Objective:  There were no vitals filed for this visit. BP Readings from Last 3 Encounters:  09/17/19 (!) 138/92  09/13/19 (!) 126/92  07/25/19 134/80   Wt Readings from Last 3 Encounters:  09/16/19 194 lb (88 kg)  07/25/19 203 lb (92.1 kg)  12/27/18 199 lb 12.5 oz (90.6 kg)   There is no height or weight on file to calculate BMI.   Physical Exam    Constitutional: Appears well-developed and well-nourished. No distress.  HENT:  Head: Normocephalic and atraumatic.  Neck: Neck supple. No tracheal deviation present. No thyromegaly present.  No cervical lymphadenopathy Cardiovascular: Normal rate, regular rhythm and normal heart sounds.   No murmur heard. No carotid bruit .  No edema Pulmonary/Chest: Effort normal and breath sounds normal. No respiratory distress. No has no wheezes. No rales.  Skin: Skin is warm and dry. Not diaphoretic.  Psychiatric: Normal mood and affect. Behavior is normal.      Assessment & Plan:    See Problem List for Assessment and Plan of chronic medical problems.    This visit occurred during the SARS-CoV-2 public health emergency.  Safety protocols were in place, including screening questions prior to the visit, additional usage of staff PPE, and extensive cleaning of exam room while observing appropriate contact time as indicated for disinfecting solutions.    This encounter was created in error - please disregard.

## 2020-01-23 NOTE — Patient Instructions (Signed)
  Blood work was ordered.     Medications changes include :     Your prescription(s) have been submitted to your pharmacy. Please take as directed and contact our office if you believe you are having problem(s) with the medication(s).   A referral was ordered for        Someone from their office will call you to schedule an appointment.    Please followup in 6 months  

## 2020-01-24 ENCOUNTER — Encounter: Payer: 59 | Admitting: Internal Medicine

## 2020-01-24 DIAGNOSIS — Z0289 Encounter for other administrative examinations: Secondary | ICD-10-CM

## 2020-02-04 NOTE — Patient Instructions (Addendum)
    Blood work was ordered.     Medications changes include :  none   Your prescription(s) have been submitted to your pharmacy. Please take as directed and contact our office if you believe you are having problem(s) with the medication(s).   Please followup in 6 months  

## 2020-02-04 NOTE — Progress Notes (Signed)
Subjective:    Patient ID: Candace [name], female    DOB: Dec 16, 1980, 40 y.o.   MRN: [id number]  HPI The patient is here for follow up of their chronic medical problems, including htn, prediabetes, obesity, ADD, anxiety, depression, OSA, vitamin d def  She has headaches every morning.  She takes BC powder and it helps.   She has not been taking her bystolic   She is seeing a therapist  Medications and allergies reviewed with patient and updated if appropriate.  Patient Active Problem List   Diagnosis Date Noted  . Anxiety and depression 07/25/2019  . ADD (attention deficit disorder) 06/22/2018  . Prediabetes 03/23/2017  . OSA (obstructive sleep apnea) 01/08/2017  . Obesity 06/26/2016  . Low back pain 09/23/2015  . Vitamin D deficiency 08/12/2015  . Alopecia 06/11/2014  . Multinodular thyroid 12/01/2011  . Insomnia 11/26/2011  . Cervical polyp 11/26/2011  . Eczema 11/26/2011  . Essential hypertension, benign 11/25/2011    Current Outpatient Medications on File Prior to Visit  Medication Sig Dispense Refill  . BYSTOLIC 10 MG tablet Take 10 mg by mouth daily.    Marland Kitchen escitalopram (LEXAPRO) 10 MG tablet Take 1 tablet (10 mg total) by mouth daily. 90 tablet 1  . labetalol (NORMODYNE) 200 MG tablet Take 1 tablet (200 mg total) by mouth 2 (two) times daily. 60 tablet 5  . lisdexamfetamine (VYVANSE) 30 MG capsule Take 1 capsule (30 mg total) by mouth daily. 30 capsule 0  . loteprednol (LOTEMAX) 0.5 % ophthalmic suspension SMARTSIG:In Eye(s)    . metroNIDAZOLE (METROGEL) 0.75 % vaginal gel Place vaginally at bedtime.    . Multiple Vitamin (MULTIVITAMIN) capsule Take 1 capsule by mouth daily.    . NON FORMULARY MAG 07    . triamcinolone cream (KENALOG) 0.1 % Apply 1 application topically 2 (two) times daily as needed. Not more than 2 weeks at a time 30 g 5   No current facility-administered medications on file prior to visit.    Past Medical History:  Diagnosis Date  . Anxiety     no meds  . Depression    no meds  . Hypertension    does not take BP med regularly, instructed to take med nest 3 days.    Past Surgical History:  Procedure Laterality Date  . DILATION AND CURETTAGE OF UTERUS     x 2 polyps removed  . DILATION AND EVACUATION N/A 02/10/2013   Procedure: DILATATION AND EVACUATION;  Surgeon: Zelphia Cairo, MD;  Location: [facility];  Service: Gynecology;  Laterality: N/A;  with intra operative [place]  . SPINE SURGERY     patient denies having spine surgery  . WISDOM TOOTH EXTRACTION  03/2014    Social History   Socioeconomic History  . Marital status: Single    Spouse name: n/a  . Number of children: 0  . Years of education: Not on file  . Highest education level: Not on file  Occupational History  . Occupation: Ecologist    Comment: [employer]  Tobacco Use  . Smoking status: Never Smoker  . Smokeless tobacco: Never Used  Vaping Use  . Vaping Use: Never used  Substance and Sexual Activity  . Alcohol use: Yes    Alcohol/week: 3.0 standard drinks    Types: 3 Standard drinks or equivalent per week    Comment: weekends - mixed drinks  . Drug use: No  . Sexual activity: Yes    Partners: Male  Birth control/protection: None  Other Topics Concern  . Not on file  Social History Narrative   Works in ED - behavioral assessments      Exercise:  none   Social Determinants of Health   Financial Resource Strain: Not on file  Food Insecurity: Not on file  Transportation Needs: Not on file  Physical Activity: Not on file  Stress: Not on file  Social Connections: Not on file    Family History  Problem Relation Age of Onset  . Diabetes Father   . Hypertension Father   . Hypertension Sister   . Hypertension Brother   . Diabetes Maternal Grandmother   . Hypertension Maternal Grandmother     Review of Systems  Respiratory: Negative for cough, shortness of breath and wheezing.   Cardiovascular: Negative for chest pain,  palpitations and leg swelling.  Neurological: Positive for headaches (mornings). Negative for dizziness and light-headedness.       Objective:   Vitals:   02/05/20 1308  BP: (!) 146/88  Pulse: 86  Temp: 98.5 F (36.9 C)  SpO2: 98%   BP Readings from Last 3 Encounters:  02/05/20 (!) 146/88  09/17/19 (!) 138/92  09/13/19 (!) 126/92   Wt Readings from Last 3 Encounters:  02/05/20 193 lb (87.5 kg)  09/16/19 194 lb (88 kg)  07/25/19 203 lb (92.1 kg)   Body mass index is 33.13 kg/m.  Depression screen [facility] 2/9 02/05/2020 07/25/2019 12/27/2018 06/22/2018 06/22/2018  Decreased Interest 3 2 0 0 0  Down, Depressed, Hopeless 1 1 0 0 0  PHQ - 2 Score 4 3 0 0 0  Altered sleeping 2 0 - 1 -  Tired, decreased energy 1 2 - 1 -  Change in appetite 2 2 - 1 -  Feeling bad or failure about yourself  1 1 - 0 -  Trouble concentrating 2 3 - 1 -  Moving slowly or fidgety/restless 0 1 - 0 -  Suicidal thoughts 0 0 - 0 -  PHQ-9 Score 12 12 - 4 -  Difficult doing work/chores Somewhat difficult Very difficult - Not difficult at all -     Physical Exam    Constitutional: Appears well-developed and well-nourished. No distress.  HENT:  Head: Normocephalic and atraumatic.  Neck: Neck supple. No tracheal deviation present. No thyromegaly present.  No cervical lymphadenopathy Cardiovascular: Normal rate, regular rhythm and normal heart sounds.   No murmur heard. No carotid bruit .  No edema Pulmonary/Chest: Effort normal and breath sounds normal. No respiratory distress. No has no wheezes. No rales.  Skin: Skin is warm and dry. Not diaphoretic.  Psychiatric: Normal mood and affect. Behavior is normal.      Assessment & Plan:    See Problem List for Assessment and Plan of chronic medical problems.    This visit occurred during the SARS-CoV-2 public health emergency.  Safety protocols were in place, including screening questions prior to the visit, additional usage of staff PPE, and extensive  cleaning of exam room while observing appropriate contact time as indicated for disinfecting solutions.

## 2020-02-05 ENCOUNTER — Ambulatory Visit: Payer: 59 | Admitting: Internal Medicine

## 2020-02-05 ENCOUNTER — Other Ambulatory Visit: Payer: Self-pay | Admitting: Internal Medicine

## 2020-02-05 ENCOUNTER — Encounter: Payer: Self-pay | Admitting: Internal Medicine

## 2020-02-05 ENCOUNTER — Other Ambulatory Visit: Payer: Self-pay

## 2020-02-05 VITALS — BP 146/88 | HR 86 | Temp 98.5°F | Ht 64.0 in | Wt 193.0 lb

## 2020-02-05 DIAGNOSIS — F32A Depression, unspecified: Secondary | ICD-10-CM

## 2020-02-05 DIAGNOSIS — E559 Vitamin D deficiency, unspecified: Secondary | ICD-10-CM

## 2020-02-05 DIAGNOSIS — L309 Dermatitis, unspecified: Secondary | ICD-10-CM | POA: Diagnosis not present

## 2020-02-05 DIAGNOSIS — I10 Essential (primary) hypertension: Secondary | ICD-10-CM

## 2020-02-05 DIAGNOSIS — Z6832 Body mass index (BMI) 32.0-32.9, adult: Secondary | ICD-10-CM

## 2020-02-05 DIAGNOSIS — G4733 Obstructive sleep apnea (adult) (pediatric): Secondary | ICD-10-CM

## 2020-02-05 DIAGNOSIS — E6609 Other obesity due to excess calories: Secondary | ICD-10-CM | POA: Diagnosis not present

## 2020-02-05 DIAGNOSIS — R7303 Prediabetes: Secondary | ICD-10-CM | POA: Diagnosis not present

## 2020-02-05 DIAGNOSIS — F419 Anxiety disorder, unspecified: Secondary | ICD-10-CM

## 2020-02-05 DIAGNOSIS — F988 Other specified behavioral and emotional disorders with onset usually occurring in childhood and adolescence: Secondary | ICD-10-CM

## 2020-02-05 LAB — COMPREHENSIVE METABOLIC PANEL
ALT: 8 U/L (ref 0–35)
AST: 12 U/L (ref 0–37)
Albumin: 4.3 g/dL (ref 3.5–5.2)
Alkaline Phosphatase: 40 U/L (ref 39–117)
BUN: 13 mg/dL (ref 6–23)
CO2: 27 mEq/L (ref 19–32)
Calcium: 8.9 mg/dL (ref 8.4–10.5)
Chloride: 103 mEq/L (ref 96–112)
Creatinine, Ser: 0.71 mg/dL (ref 0.40–1.20)
GFR: 107.21 mL/min (ref >60.00)
Glucose, Bld: 86 mg/dL (ref 70–99)
Potassium: 3.2 mEq/L — ABNORMAL LOW (ref 3.5–5.1)
Sodium: 138 mEq/L (ref 135–145)
Total Bilirubin: 0.5 mg/dL (ref 0.2–1.2)
Total Protein: 7.2 g/dL (ref 6.0–8.3)

## 2020-02-05 LAB — VITAMIN D 25 HYDROXY (VIT D DEFICIENCY, FRACTURES): VITD: 18.4 ng/mL — ABNORMAL LOW (ref 30.00–100.00)

## 2020-02-05 LAB — HEMOGLOBIN A1C: Hgb A1c MFr Bld: 5.8 % (ref 4.6–6.5)

## 2020-02-05 MED ORDER — TRIAMCINOLONE ACETONIDE 0.1 % EX CREA: 1.0000 "application " | TOPICAL_CREAM | Freq: Two times a day (BID) | CUTANEOUS | 5 refills | Status: DC | PRN

## 2020-02-05 MED ORDER — LISDEXAMFETAMINE DIMESYLATE 30 MG PO CAPS: 30.0000 mg | ORAL_CAPSULE | Freq: Every day | ORAL | 0 refills | Status: DC

## 2020-02-05 MED FILL — VYVANSE 30 MG CAPSULE: 30 | 30 days supply | Qty: 30 | Fill #0

## 2020-02-05 MED FILL — TRIAMCINOLONE 0.1% CREAM: 0.1 | 14 days supply | Qty: 30 | Fill #0

## 2020-02-05 NOTE — Assessment & Plan Note (Addendum)
Chronic Has morning headaches Not using cpap nightly Stressed using cpap nightly Discussed consequences of untreated OSA Info given on consequences of untreated OSA

## 2020-02-05 NOTE — Assessment & Plan Note (Addendum)
Chronic Stopped lexapro - states she probably did not take it long enough to see if it would help Depression and anxiety not controlled - she states that and PHQ9 postive Deferred medication at this time She is seeing a therapist but has not been recently - will reconnect Has had some issues with her boyfriend and feels burnt out at work - both are contributing and she feels are circumstantial Discussed natural ways of improving depression/anxiety Advised using cpap regularly - may also contribute to mood/energy

## 2020-02-05 NOTE — Assessment & Plan Note (Signed)
Chronic Taking vitamin D but not regularly- advised taking it daily Check vitamin D level

## 2020-02-05 NOTE — Assessment & Plan Note (Signed)
Chronic Has lost weight Has been eating more healthy Will start exercising

## 2020-02-05 NOTE — Assessment & Plan Note (Signed)
Chronic Did restart vyvanse due to poor focus which is likely multifactorial Continue vyvanse 30 mg daily

## 2020-02-05 NOTE — Assessment & Plan Note (Signed)
Chronic Check a1c Low sugar / carb diet Stressed regular exercise  

## 2020-02-05 NOTE — Assessment & Plan Note (Signed)
Chronic BP high here today She has not been taking her medication - bystolic ( will change to labetalol when she tries to get pregnant) Stressed needing to take her medication daily and consequences of uncontrolled BP Restart bystolic 10 mg daily Work on weight loss, start regular exercise

## 2020-02-05 NOTE — Assessment & Plan Note (Signed)
Chronic occ flares Triamcinolone 0.1% cream BID prn

## 2020-02-06 NOTE — Addendum Note (Signed)
Addended by: Pincus Sanes on: 02/06/2020 01:09 PM   Modules accepted: Orders

## 2020-02-09 ENCOUNTER — Encounter: Payer: Self-pay | Admitting: Internal Medicine

## 2020-02-09 DIAGNOSIS — H10413 Chronic giant papillary conjunctivitis, bilateral: Secondary | ICD-10-CM | POA: Diagnosis not present

## 2020-02-09 DIAGNOSIS — H17823 Peripheral opacity of cornea, bilateral: Secondary | ICD-10-CM | POA: Diagnosis not present

## 2020-02-15 ENCOUNTER — Other Ambulatory Visit: Payer: Self-pay | Admitting: Internal Medicine

## 2020-02-15 MED FILL — NEBIVOLOL HCL 10 MG TABS: 10 | 90 days supply | Qty: 90 | Fill #0

## 2020-03-22 ENCOUNTER — Other Ambulatory Visit: Payer: Self-pay | Admitting: Internal Medicine

## 2020-03-22 ENCOUNTER — Other Ambulatory Visit: Payer: Self-pay | Admitting: Family

## 2020-03-22 ENCOUNTER — Encounter: Payer: Self-pay | Admitting: Internal Medicine

## 2020-03-22 MED ORDER — LISDEXAMFETAMINE DIMESYLATE 30 MG PO CAPS: 30.0000 mg | ORAL_CAPSULE | Freq: Every day | ORAL | 0 refills | Status: DC

## 2020-03-22 MED FILL — VYVANSE 30 MG CAPSULE: 30 | 30 days supply | Qty: 30 | Fill #0

## 2020-03-25 DIAGNOSIS — F4322 Adjustment disorder with anxiety: Secondary | ICD-10-CM | POA: Diagnosis not present

## 2020-04-05 NOTE — Telephone Encounter (Signed)
Check [place] registry last filled 03/22/2020.Marland KitchenRaechel Chute

## 2020-04-18 ENCOUNTER — Encounter: Payer: Self-pay | Admitting: Internal Medicine

## 2020-04-19 ENCOUNTER — Other Ambulatory Visit (HOSPITAL_COMMUNITY): Payer: Self-pay | Admitting: Obstetrics and Gynecology

## 2020-04-19 MED ORDER — LISDEXAMFETAMINE DIMESYLATE 30 MG PO CAPS: 30.0000 mg | ORAL_CAPSULE | Freq: Every day | ORAL | 0 refills | Status: DC

## 2020-04-25 DIAGNOSIS — F4322 Adjustment disorder with anxiety: Secondary | ICD-10-CM | POA: Diagnosis not present

## 2020-05-23 DIAGNOSIS — F4322 Adjustment disorder with anxiety: Secondary | ICD-10-CM | POA: Diagnosis not present

## 2020-05-24 ENCOUNTER — Other Ambulatory Visit: Payer: Self-pay | Admitting: Obstetrics and Gynecology

## 2020-05-24 ENCOUNTER — Other Ambulatory Visit (HOSPITAL_COMMUNITY): Payer: Self-pay

## 2020-05-24 ENCOUNTER — Other Ambulatory Visit: Payer: Self-pay | Admitting: Internal Medicine

## 2020-05-24 MED ORDER — LISDEXAMFETAMINE DIMESYLATE 30 MG PO CAPS
ORAL_CAPSULE | Freq: Every day | ORAL | 0 refills | Status: DC
  Filled 2020-05-24: qty 30, 30d supply, fill #0

## 2020-05-30 ENCOUNTER — Other Ambulatory Visit: Payer: Self-pay | Admitting: Obstetrics and Gynecology

## 2020-05-30 ENCOUNTER — Other Ambulatory Visit (HOSPITAL_COMMUNITY): Payer: Self-pay

## 2020-05-31 ENCOUNTER — Other Ambulatory Visit (HOSPITAL_COMMUNITY): Payer: Self-pay

## 2020-06-01 ENCOUNTER — Other Ambulatory Visit: Payer: Self-pay | Admitting: Obstetrics and Gynecology

## 2020-06-01 ENCOUNTER — Other Ambulatory Visit (HOSPITAL_COMMUNITY): Payer: Self-pay

## 2020-06-04 ENCOUNTER — Other Ambulatory Visit (HOSPITAL_COMMUNITY): Payer: Self-pay

## 2020-06-04 ENCOUNTER — Other Ambulatory Visit: Payer: Self-pay | Admitting: Obstetrics and Gynecology

## 2020-06-10 ENCOUNTER — Other Ambulatory Visit (HOSPITAL_COMMUNITY): Payer: Self-pay

## 2020-06-10 ENCOUNTER — Other Ambulatory Visit: Payer: Self-pay

## 2020-06-28 ENCOUNTER — Encounter: Payer: Self-pay | Admitting: Internal Medicine

## 2020-06-28 MED ORDER — METRONIDAZOLE 0.75 % VA GEL
VAGINAL | 0 refills | Status: DC
  Filled 2020-06-28: qty 70, 5d supply, fill #0

## 2020-06-28 MED ORDER — LISDEXAMFETAMINE DIMESYLATE 30 MG PO CAPS
ORAL_CAPSULE | Freq: Every day | ORAL | 0 refills | Status: DC
  Filled 2020-06-28: qty 30, 30d supply, fill #0

## 2020-06-28 MED ORDER — TRIAMCINOLONE ACETONIDE 0.1 % EX CREA
TOPICAL_CREAM | CUTANEOUS | 5 refills | Status: DC
  Filled 2020-06-28: qty 30, 14d supply, fill #0
  Filled 2021-01-15: qty 30, 14d supply, fill #1

## 2020-06-29 ENCOUNTER — Other Ambulatory Visit (HOSPITAL_COMMUNITY): Payer: Self-pay

## 2020-07-17 DIAGNOSIS — Z6829 Body mass index (BMI) 29.0-29.9, adult: Secondary | ICD-10-CM | POA: Diagnosis not present

## 2020-07-17 DIAGNOSIS — Z01419 Encounter for gynecological examination (general) (routine) without abnormal findings: Secondary | ICD-10-CM | POA: Diagnosis not present

## 2020-07-22 ENCOUNTER — Other Ambulatory Visit (HOSPITAL_COMMUNITY): Payer: Self-pay

## 2020-07-22 ENCOUNTER — Telehealth: Payer: 59 | Admitting: Emergency Medicine

## 2020-07-22 MED ORDER — TOBRAMYCIN-DEXAMETHASONE 0.3-0.1 % OP SUSP
OPHTHALMIC | 0 refills | Status: DC
Start: 2020-07-22 — End: 2020-09-13
  Filled 2020-07-22: qty 5, 10d supply, fill #0

## 2020-07-24 DIAGNOSIS — Z319 Encounter for procreative management, unspecified: Secondary | ICD-10-CM | POA: Diagnosis not present

## 2020-07-24 DIAGNOSIS — E288 Other ovarian dysfunction: Secondary | ICD-10-CM | POA: Diagnosis not present

## 2020-08-08 ENCOUNTER — Encounter: Payer: 59 | Admitting: Internal Medicine

## 2020-08-13 ENCOUNTER — Encounter: Payer: 59 | Admitting: Internal Medicine

## 2020-08-23 ENCOUNTER — Encounter: Payer: Self-pay | Admitting: Internal Medicine

## 2020-08-23 ENCOUNTER — Other Ambulatory Visit (HOSPITAL_COMMUNITY): Payer: Self-pay

## 2020-08-23 ENCOUNTER — Other Ambulatory Visit: Payer: Self-pay | Admitting: Internal Medicine

## 2020-08-26 ENCOUNTER — Other Ambulatory Visit (HOSPITAL_COMMUNITY): Payer: Self-pay

## 2020-08-26 MED ORDER — LISDEXAMFETAMINE DIMESYLATE 30 MG PO CAPS
ORAL_CAPSULE | Freq: Every day | ORAL | 0 refills | Status: DC
  Filled 2020-08-26: qty 30, 30d supply, fill #0

## 2020-08-26 MED ORDER — FLUCONAZOLE 150 MG PO TABS
ORAL_TABLET | ORAL | 1 refills | Status: DC
  Filled 2020-08-26: qty 1, 1d supply, fill #0

## 2020-08-26 NOTE — Telephone Encounter (Signed)
Both were sent earlier today

## 2020-08-30 ENCOUNTER — Other Ambulatory Visit (HOSPITAL_COMMUNITY): Payer: Self-pay

## 2020-08-30 DIAGNOSIS — E288 Other ovarian dysfunction: Secondary | ICD-10-CM | POA: Diagnosis not present

## 2020-08-30 DIAGNOSIS — Z3141 Encounter for fertility testing: Secondary | ICD-10-CM | POA: Diagnosis not present

## 2020-08-30 DIAGNOSIS — Z3183 Encounter for assisted reproductive fertility procedure cycle: Secondary | ICD-10-CM | POA: Diagnosis not present

## 2020-08-30 DIAGNOSIS — Z113 Encounter for screening for infections with a predominantly sexual mode of transmission: Secondary | ICD-10-CM | POA: Diagnosis not present

## 2020-08-30 DIAGNOSIS — N979 Female infertility, unspecified: Secondary | ICD-10-CM | POA: Diagnosis not present

## 2020-08-30 MED ORDER — NORETHINDRONE ACETATE 5 MG PO TABS
ORAL_TABLET | ORAL | 1 refills | Status: DC
  Filled 2020-08-30: qty 30, 30d supply, fill #0

## 2020-09-02 ENCOUNTER — Other Ambulatory Visit (HOSPITAL_COMMUNITY): Payer: Self-pay

## 2020-09-02 MED ORDER — CHORIONIC GONADOTROPIN 10000 UNITS IM SOLR
INTRAMUSCULAR | 0 refills | Status: DC
  Filled 2020-09-02 – 2020-09-03 (×2): qty 1, 1d supply, fill #0

## 2020-09-02 MED ORDER — GANIRELIX ACETATE 250 MCG/0.5ML ~~LOC~~ SOSY
PREFILLED_SYRINGE | SUBCUTANEOUS | 1 refills | Status: DC
  Filled 2020-09-02: qty 2.5, 5d supply, fill #0

## 2020-09-02 MED ORDER — DOXYCYCLINE MONOHYDRATE 100 MG PO TABS
100.0000 mg | ORAL_TABLET | Freq: Two times a day (BID) | ORAL | 0 refills | Status: DC
  Filled 2020-09-02: qty 20, 10d supply, fill #0

## 2020-09-02 MED ORDER — FOLLITROPIN ALFA 900 UNIT/1.5ML ~~LOC~~ SOPN
300.0000 [IU] | PEN_INJECTOR | Freq: Every day | SUBCUTANEOUS | 2 refills | Status: DC
  Filled 2020-09-02: qty 4.5, 9d supply, fill #0

## 2020-09-02 MED ORDER — CETROTIDE 0.25 MG ~~LOC~~ KIT
PACK | SUBCUTANEOUS | 1 refills | Status: DC
  Filled 2020-09-02: qty 5, 5d supply, fill #0

## 2020-09-02 MED ORDER — LEUPROLIDE ACETATE 1 MG/0.2ML IJ KIT
PACK | INTRAMUSCULAR | 1 refills | Status: DC
  Filled 2020-09-02 – 2020-09-09 (×2): qty 1, 1d supply, fill #0

## 2020-09-02 MED ORDER — "SYRINGE/NEEDLE (DISP) 18G X 1-1/2"" 3 ML MISC"
1 refills | Status: DC
  Filled 2020-09-02: qty 20, 20d supply, fill #0

## 2020-09-02 MED ORDER — MENOTROPINS 75 UNITS ~~LOC~~ SOLR
75.0000 [IU] | Freq: Every day | SUBCUTANEOUS | 1 refills | Status: DC
  Filled 2020-09-02: qty 10, 10d supply, fill #0

## 2020-09-02 MED ORDER — "NEEDLE (DISP) 30G X 1/2"" MISC"
1 refills | Status: DC
  Filled 2020-09-02: qty 20, 20d supply, fill #0

## 2020-09-03 ENCOUNTER — Other Ambulatory Visit (HOSPITAL_COMMUNITY): Payer: Self-pay

## 2020-09-04 ENCOUNTER — Encounter: Payer: 59 | Admitting: Internal Medicine

## 2020-09-04 ENCOUNTER — Other Ambulatory Visit (HOSPITAL_COMMUNITY): Payer: Self-pay

## 2020-09-05 ENCOUNTER — Other Ambulatory Visit (HOSPITAL_COMMUNITY): Payer: Self-pay

## 2020-09-09 ENCOUNTER — Other Ambulatory Visit (HOSPITAL_COMMUNITY): Payer: Self-pay

## 2020-09-10 ENCOUNTER — Other Ambulatory Visit (HOSPITAL_COMMUNITY): Payer: Self-pay

## 2020-09-12 ENCOUNTER — Other Ambulatory Visit (HOSPITAL_COMMUNITY): Payer: Self-pay

## 2020-09-12 ENCOUNTER — Encounter: Payer: Self-pay | Admitting: Internal Medicine

## 2020-09-12 NOTE — Patient Instructions (Addendum)
Blood work was ordered.     Medications changes include :  discontinue the bystolic     Please followup in 6 months    Health Maintenance, Female Adopting a healthy lifestyle and getting preventive care are important in promoting health and wellness. Ask your health care provider about: The right schedule for you to have regular tests and exams. Things you can do on your own to prevent diseases and keep yourself healthy. What should I know about diet, weight, and exercise? Eat a healthy diet  Eat a diet that includes plenty of vegetables, fruits, low-fat dairy products, and lean protein. Do not eat a lot of foods that are high in solid fats, added sugars, or sodium.  Maintain a healthy weight Body mass index (BMI) is used to identify weight problems. It estimates body fat based on height and weight. Your health care provider can help determineyour BMI and help you achieve or maintain a healthy weight. Get regular exercise Get regular exercise. This is one of the most important things you can do for your health. Most adults should: Exercise for at least 150 minutes each week. The exercise should increase your heart rate and make you sweat (moderate-intensity exercise). Do strengthening exercises at least twice a week. This is in addition to the moderate-intensity exercise. Spend less time sitting. Even light physical activity can be beneficial. Watch cholesterol and blood lipids Have your blood tested for lipids and cholesterol at 40 years of age, then havethis test every 5 years. Have your cholesterol levels checked more often if: Your lipid or cholesterol levels are high. You are older than 40 years of age. You are at high risk for heart disease. What should I know about cancer screening? Depending on your health history and family history, you may need to have cancer screening at various ages. This may include screening for: Breast cancer. Cervical cancer. Colorectal  cancer. Skin cancer. Lung cancer. What should I know about heart disease, diabetes, and high blood pressure? Blood pressure and heart disease High blood pressure causes heart disease and increases the risk of stroke. This is more likely to develop in people who have high blood pressure readings, are of African descent, or are overweight. Have your blood pressure checked: Every 3-5 years if you are 62-63 years of age. Every year if you are 47 years old or older. Diabetes Have regular diabetes screenings. This checks your fasting blood sugar level. Have the screening done: Once every three years after age 36 if you are at a normal weight and have a low risk for diabetes. More often and at a younger age if you are overweight or have a high risk for diabetes. What should I know about preventing infection? Hepatitis B If you have a higher risk for hepatitis B, you should be screened for this virus. Talk with your health care provider to find out if you are at risk forhepatitis B infection. Hepatitis C Testing is recommended for: Everyone born from [birth year] through 1965. Anyone with known risk factors for hepatitis C. Sexually transmitted infections (STIs) Get screened for STIs, including gonorrhea and chlamydia, if: You are sexually active and are younger than 40 years of age. You are older than 40 years of age and your health care provider tells you that you are at risk for this type of infection. Your sexual activity has changed since you were last screened, and you are at increased risk for chlamydia or gonorrhea. Ask your health care provider if you  are at risk. Ask your health care provider about whether you are at high risk for HIV. Your health care provider may recommend a prescription medicine to help prevent HIV infection. If you choose to take medicine to prevent HIV, you should first get tested for HIV. You should then be tested every 3 months for as long as you are taking the  medicine. Pregnancy If you are about to stop having your period (premenopausal) and you may become pregnant, seek counseling before you get pregnant. Take 400 to 800 micrograms (mcg) of folic acid every day if you become pregnant. Ask for birth control (contraception) if you want to prevent pregnancy. Osteoporosis and menopause Osteoporosis is a disease in which the bones lose minerals and strength with aging. This can result in bone fractures. If you are 56 years old or older, or if you are at risk for osteoporosis and fractures, ask your health care provider if you should: Be screened for bone loss. Take a calcium or vitamin D supplement to lower your risk of fractures. Be given hormone replacement therapy (HRT) to treat symptoms of menopause. Follow these instructions at home: Lifestyle Do not use any products that contain nicotine or tobacco, such as cigarettes, e-cigarettes, and chewing tobacco. If you need help quitting, ask your health care provider. Do not use street drugs. Do not share needles. Ask your health care provider for help if you need support or information about quitting drugs. Alcohol use Do not drink alcohol if: Your health care provider tells you not to drink. You are pregnant, may be pregnant, or are planning to become pregnant. If you drink alcohol: Limit how much you use to 0-1 drink a day. Limit intake if you are breastfeeding. Be aware of how much alcohol is in your drink. In the [place] one drink equals one 12 oz bottle of beer (355 mL), one 5 oz glass of wine (148 mL), or one 1 oz glass of hard liquor (44 mL). General instructions Schedule regular health, dental, and eye exams. Stay current with your vaccines. Tell your health care provider if: You often feel depressed. You have ever been abused or do not feel safe at home. Summary Adopting a healthy lifestyle and getting preventive care are important in promoting health and wellness. Follow your health  care provider's instructions about healthy diet, exercising, and getting tested or screened for diseases. Follow your health care provider's instructions on monitoring your cholesterol and blood pressure. This information is not intended to replace advice given to you by your health care provider. Make sure you discuss any questions you have with your healthcare provider. Document Revised: 01/12/2018 Document Reviewed: 01/12/2018 Elsevier Patient Education  2022 [employer].

## 2020-09-12 NOTE — Progress Notes (Signed)
Subjective:    Patient ID: Candace [name], female    DOB: 08-07-1980, 40 y.o.   MRN: [id number]   This visit occurred during the SARS-CoV-2 public health emergency.  Safety protocols were in place, including screening questions prior to the visit, additional usage of staff PPE, and extensive cleaning of exam room while observing appropriate contact time as indicated for disinfecting solutions.    HPI She is here for a physical exam.   She is going through IVF  - about to start with egg retrieval this month.  She has changed how she has been eating and has been walking.  She has lost weight and feels very good.  Medications and allergies reviewed with patient and updated if appropriate.  Patient Active Problem List   Diagnosis Date Noted   Anxiety and depression 07/25/2019   ADD (attention deficit disorder) 06/22/2018   Prediabetes 03/23/2017   OSA (obstructive sleep apnea) 01/08/2017   Obesity 06/26/2016   Low back pain 09/23/2015   Vitamin D deficiency 08/12/2015   Alopecia 06/11/2014   Multinodular thyroid 12/01/2011   Insomnia 11/26/2011   Cervical polyp 11/26/2011   Eczema 11/26/2011   Essential hypertension, benign 11/25/2011    Current Outpatient Medications on File Prior to Visit  Medication Sig Dispense Refill   Cetrorelix Acetate (CETROTIDE) 0.25 MG KIT Reconstitute and inject 1 syringe daily 5 kit 1   doxycycline (ADOXA) 100 MG tablet Take 1 tablet (100 mg total) by mouth 2 (two) times daily. 20 tablet 0   fluconazole (DIFLUCAN) 150 MG tablet Take 1 tablet by mouth for 1 day. 1 tablet 1   Follitropin Alfa 900 UNIT/1.5ML SOPN Inject 300 Units into the skin daily or as directed 4.5 mL 2   Ganirelix Acetate 250 MCG/0.5ML SOSY Inject 1 pre-filled syringe daily or as directed 2.5 mL 1   human chorionic gonadotropin (PREGNYL/NOVAREL) 10000 units injection Mix with 67m of diluent and inject at the exact time given as directed 1 each 0   leuprolide (LUPRON) 1 MG/0.2ML  injection Inject 80 units subcutaneously daily 1 kit 1   lisdexamfetamine (VYVANSE) 30 MG capsule TAKE 1 CAPSULE BY MOUTH ONCE A DAY 30 capsule 0   Menotropins 75 units SOLR Inject 75 Units into the skin daily or as directed 10 each 1   metroNIDAZOLE (FLAGYL) 500 MG tablet TAKE 1 TABLET BY MOUTH 2 TIMES DAILY FOR 7 DAYS 14 tablet 0   metroNIDAZOLE (METROGEL) 0.75 % vaginal gel INSERT 1 APPLICATORFUL VAGINALLY DAILY AS DIRECTED FOR 5 DAYS. 70 g 0   Multiple Vitamin (MULTIVITAMIN) capsule Take 1 capsule by mouth daily.     NEEDLE, DISP, 30 G 30G X 1/2" MISC Use as directed 20 each 1   NON FORMULARY MAG 07     norethindrone (AYGESTIN) 5 MG tablet Take 1 Tablet by mouth once daily 30 tablet 1   SYRINGE-NEEDLE, DISP, 3 ML 18G X 1-1/2" 3 ML MISC Use as directed 20 each 1   triamcinolone cream (KENALOG) 0.1 % APPLY TOPICALLY TWICE A DAY AS NEEDED. NOT FOR MORE THAN 2 WEEKS AT A TIME. 30 g 5   No current facility-administered medications on file prior to visit.    Past Medical History:  Diagnosis Date   Anxiety    no meds   Depression    no meds   Hypertension    does not take BP med regularly, instructed to take med nest 3 days.    Past Surgical History:  Procedure Laterality Date   DILATION AND CURETTAGE OF UTERUS     x 2 polyps removed   DILATION AND EVACUATION N/A 02/10/2013   Procedure: DILATATION AND EVACUATION;  Surgeon: Marylynn Pearson, MD;  Location: [facility];  Service: Gynecology;  Laterality: N/A;  with intra operative [place]   [place]     patient denies having spine surgery   WISDOM TOOTH EXTRACTION  03/2014    Social History   Socioeconomic History   Marital status: Single    Spouse name: n/a   Number of children: 0   Years of education: Not on file   Highest education level: Not on file  Occupational History   Occupation: assessment counselor    Comment: [employer]  Tobacco Use   Smoking status: Never   Smokeless tobacco: Never  Vaping Use   Vaping Use: Never  used  Substance and Sexual Activity   Alcohol use: Yes    Alcohol/week: 3.0 standard drinks    Types: 3 Standard drinks or equivalent per week    Comment: weekends - mixed drinks   Drug use: No   Sexual activity: Yes    Partners: Male    Birth control/protection: None  Other Topics Concern   Not on file  Social History Narrative   Works in ED - behavioral assessments      Exercise:  none   Social Determinants of Radio broadcast assistant Strain: Not on file  Food Insecurity: Not on file  Transportation Needs: Not on file  Physical Activity: Not on file  Stress: Not on file  Social Connections: Not on file    Family History  Problem Relation Age of Onset   Diabetes Father    Hypertension Father    Hypertension Sister    Hypertension Brother    Diabetes Maternal Grandmother    Hypertension Maternal Grandmother     Review of Systems  Constitutional:  Negative for chills and fever.  Eyes:  Negative for visual disturbance.  Respiratory:  Negative for cough, shortness of breath and wheezing.   Cardiovascular:  Negative for chest pain, palpitations and leg swelling.  Gastrointestinal:  Negative for abdominal pain, blood in stool, constipation, diarrhea and nausea.       No gerd  Genitourinary:  Negative for dysuria.  Musculoskeletal:  Negative for arthralgias and back pain.  Skin:  Positive for rash (eczema). Negative for color change.  Neurological:  Positive for headaches (occ). Negative for light-headedness.  Psychiatric/Behavioral:  Positive for dysphoric mood (well controlled). The patient is nervous/anxious (related to work).       Objective:   Vitals:   09/13/20 1036  BP: 124/80  Pulse: 97  Temp: 98.3 F (36.8 C)  SpO2: 99%   Filed Weights   09/13/20 1036  Weight: 172 lb (78 kg)   Body mass index is 29.52 kg/m.  BP Readings from Last 3 Encounters:  09/13/20 124/80  02/05/20 (!) 146/88  09/17/19 (!) 138/92    Wt Readings from Last 3  Encounters:  09/13/20 172 lb (78 kg)  02/05/20 193 lb (87.5 kg)  09/16/19 194 lb (88 kg)     Physical Exam Constitutional: She appears well-developed and well-nourished. No distress.  HENT:  Head: Normocephalic and atraumatic.  Right Ear: External ear normal. Normal ear canal and TM Left Ear: External ear normal.  Normal ear canal and TM Mouth/Throat: Oropharynx is clear and moist.  Eyes: Conjunctivae and EOM are normal.  Neck: Neck supple. No tracheal deviation  present.  Thyromegaly present.  No carotid bruit  Cardiovascular: Normal rate, regular rhythm and normal heart sounds.   No murmur heard.  Mild left ankle/foot edema. Pulmonary/Chest: Effort normal and breath sounds normal. No respiratory distress. She has no wheezes. She has no rales.  Breast: deferred   Abdominal: Soft. She exhibits no distension. There is no tenderness.  Lymphadenopathy: She has no cervical adenopathy.  Skin: Skin is warm and dry. She is not diaphoretic.  Psychiatric: She has a normal mood and affect. Her behavior is normal.     Lab Results  Component Value Date   WBC 3.6 (L) 12/27/2018   HGB 12.3 12/27/2018   HCT 37.2 12/27/2018   PLT 183.0 12/27/2018   GLUCOSE 86 02/05/2020   CHOL 181 06/22/2018   TRIG 58.0 06/22/2018   HDL 52.80 06/22/2018   LDLCALC 116 (H) 06/22/2018   ALT 8 02/05/2020   AST 12 02/05/2020   NA 138 02/05/2020   K 3.2 (L) 02/05/2020   CL 103 02/05/2020   CREATININE 0.71 02/05/2020   BUN 13 02/05/2020   CO2 27 02/05/2020   TSH 1.63 06/22/2018   HGBA1C 5.8 02/05/2020         Assessment & Plan:   Physical exam: Screening blood work  ordered Exercise  walking Weight  has lost weight - has been eating much more healthy and is walking Substance abuse  none   Reviewed recommended immunizations.  Will get covid booster   Health Maintenance  Topic Date Due   COVID-19 Vaccine (1) Never done   INFLUENZA VACCINE  09/02/2020   Hepatitis C Screening  02/04/2041  (Originally [birth year]/1998)   PAP SMEAR-Modifier  06/10/2023   TETANUS/TDAP  12/29/2025   HIV Screening  Completed   Pneumococcal Vaccine 90-67 Years old  Aged Out   HPV VACCINES  Aged Out          See Problem List for Assessment and Plan of chronic medical problems.

## 2020-09-13 ENCOUNTER — Other Ambulatory Visit: Payer: Self-pay

## 2020-09-13 ENCOUNTER — Ambulatory Visit (INDEPENDENT_AMBULATORY_CARE_PROVIDER_SITE_OTHER): Payer: 59 | Admitting: Internal Medicine

## 2020-09-13 VITALS — BP 124/80 | HR 97 | Temp 98.3°F | Ht 64.0 in | Wt 172.0 lb

## 2020-09-13 DIAGNOSIS — R7303 Prediabetes: Secondary | ICD-10-CM

## 2020-09-13 DIAGNOSIS — F32A Depression, unspecified: Secondary | ICD-10-CM | POA: Diagnosis not present

## 2020-09-13 DIAGNOSIS — G4733 Obstructive sleep apnea (adult) (pediatric): Secondary | ICD-10-CM | POA: Diagnosis not present

## 2020-09-13 DIAGNOSIS — E042 Nontoxic multinodular goiter: Secondary | ICD-10-CM | POA: Diagnosis not present

## 2020-09-13 DIAGNOSIS — F419 Anxiety disorder, unspecified: Secondary | ICD-10-CM

## 2020-09-13 DIAGNOSIS — L309 Dermatitis, unspecified: Secondary | ICD-10-CM

## 2020-09-13 DIAGNOSIS — E559 Vitamin D deficiency, unspecified: Secondary | ICD-10-CM

## 2020-09-13 DIAGNOSIS — Z0000 Encounter for general adult medical examination without abnormal findings: Secondary | ICD-10-CM | POA: Diagnosis not present

## 2020-09-13 DIAGNOSIS — F988 Other specified behavioral and emotional disorders with onset usually occurring in childhood and adolescence: Secondary | ICD-10-CM

## 2020-09-13 DIAGNOSIS — I10 Essential (primary) hypertension: Secondary | ICD-10-CM | POA: Diagnosis not present

## 2020-09-13 LAB — LIPID PANEL
Cholesterol: 177 mg/dL (ref 0–200)
HDL: 46.4 mg/dL (ref >39.00)
LDL Cholesterol: 122 mg/dL — ABNORMAL HIGH (ref 0–99)
NonHDL: 130.55
Total CHOL/HDL Ratio: 4
Triglycerides: 43 mg/dL (ref 0.0–149.0)
VLDL: 8.6 mg/dL (ref 0.0–40.0)

## 2020-09-13 LAB — CBC WITH DIFFERENTIAL/PLATELET
Basophils Absolute: 0 10*3/uL (ref 0.0–0.1)
Basophils Relative: 0.7 % (ref 0.0–3.0)
Eosinophils Absolute: 0.3 10*3/uL (ref 0.0–0.7)
Eosinophils Relative: 6.1 % — ABNORMAL HIGH (ref 0.0–5.0)
HCT: 34.3 % — ABNORMAL LOW (ref 36.0–46.0)
Hemoglobin: 11.4 g/dL — ABNORMAL LOW (ref 12.0–15.0)
Lymphocytes Relative: 36.7 % (ref 12.0–46.0)
Lymphs Abs: 1.7 10*3/uL (ref 0.7–4.0)
MCHC: 33.3 g/dL (ref 30.0–36.0)
MCV: 87.8 fl (ref 78.0–100.0)
Monocytes Absolute: 0.3 10*3/uL (ref 0.1–1.0)
Monocytes Relative: 6.7 % (ref 3.0–12.0)
Neutro Abs: 2.3 10*3/uL (ref 1.4–7.7)
Neutrophils Relative %: 49.8 % (ref 43.0–77.0)
Platelets: 198 10*3/uL (ref 150.0–400.0)
RBC: 3.91 Mil/uL (ref 3.87–5.11)
RDW: 14.7 % (ref 11.5–15.5)
WBC: 4.7 10*3/uL (ref 4.0–10.5)

## 2020-09-13 LAB — COMPREHENSIVE METABOLIC PANEL
ALT: 10 U/L (ref 0–35)
AST: 13 U/L (ref 0–37)
Albumin: 4.1 g/dL (ref 3.5–5.2)
Alkaline Phosphatase: 36 U/L — ABNORMAL LOW (ref 39–117)
BUN: 12 mg/dL (ref 6–23)
CO2: 25 mEq/L (ref 19–32)
Calcium: 8.9 mg/dL (ref 8.4–10.5)
Chloride: 105 mEq/L (ref 96–112)
Creatinine, Ser: 0.7 mg/dL (ref 0.40–1.20)
GFR: 108.59 mL/min (ref >60.00)
Glucose, Bld: 87 mg/dL (ref 70–99)
Potassium: 3.5 mEq/L (ref 3.5–5.1)
Sodium: 137 mEq/L (ref 135–145)
Total Bilirubin: 0.6 mg/dL (ref 0.2–1.2)
Total Protein: 7.3 g/dL (ref 6.0–8.3)

## 2020-09-13 LAB — TSH: TSH: 1.98 u[IU]/mL (ref 0.35–5.50)

## 2020-09-13 LAB — VITAMIN D 25 HYDROXY (VIT D DEFICIENCY, FRACTURES): VITD: 17.27 ng/mL — ABNORMAL LOW (ref 30.00–100.00)

## 2020-09-13 LAB — HEMOGLOBIN A1C: Hgb A1c MFr Bld: 5.6 % (ref 4.6–6.5)

## 2020-09-13 NOTE — Assessment & Plan Note (Addendum)
Chronic BP well controlled off medication - has not taken her medication in a while Discontinue bystolic Advised her to monitor closely off medication cmp

## 2020-09-13 NOTE — Assessment & Plan Note (Signed)
Chronic Taking vitamin D daily Check vitamin D level  

## 2020-09-13 NOTE — Assessment & Plan Note (Signed)
Chronic Check a1c Low sugar / carb diet Stressed regular exercise  

## 2020-09-13 NOTE — Assessment & Plan Note (Signed)
Chronic She feels her depression is well controlled She does have some anxiety, mostly related to work, but feels overall that is controlled as well She is currently not on any medication.

## 2020-09-13 NOTE — Assessment & Plan Note (Signed)
Chronic Controlled She still struggles with attention sometimes, but feels the medication is working well Continue Vyvanse 30 mg daily Follow-up in 6 months

## 2020-09-13 NOTE — Assessment & Plan Note (Signed)
Chronic Controlled Continue triamcinolone cream as needed

## 2020-09-13 NOTE — Assessment & Plan Note (Signed)
Chronic Stable on exam Euthyroid Check TSH

## 2020-09-13 NOTE — Assessment & Plan Note (Signed)
Chronic Not on CPAP She has lost a significant amount of weight so unlikely needs treatment at this time

## 2020-09-16 ENCOUNTER — Other Ambulatory Visit (HOSPITAL_COMMUNITY): Payer: Self-pay

## 2020-09-16 DIAGNOSIS — N979 Female infertility, unspecified: Secondary | ICD-10-CM | POA: Diagnosis not present

## 2020-09-16 DIAGNOSIS — Z3183 Encounter for assisted reproductive fertility procedure cycle: Secondary | ICD-10-CM | POA: Diagnosis not present

## 2020-09-16 MED ORDER — CETROTIDE 0.25 MG ~~LOC~~ KIT
PACK | SUBCUTANEOUS | 1 refills | Status: DC
  Filled 2020-09-16: qty 6, 6d supply, fill #0

## 2020-09-17 ENCOUNTER — Other Ambulatory Visit (HOSPITAL_COMMUNITY): Payer: Self-pay

## 2020-09-20 DIAGNOSIS — Z3183 Encounter for assisted reproductive fertility procedure cycle: Secondary | ICD-10-CM | POA: Diagnosis not present

## 2020-09-20 DIAGNOSIS — Z3184 Encounter for fertility preservation procedure: Secondary | ICD-10-CM | POA: Diagnosis not present

## 2020-09-20 DIAGNOSIS — N979 Female infertility, unspecified: Secondary | ICD-10-CM | POA: Diagnosis not present

## 2020-09-23 DIAGNOSIS — Z3183 Encounter for assisted reproductive fertility procedure cycle: Secondary | ICD-10-CM | POA: Diagnosis not present

## 2020-09-23 DIAGNOSIS — Z3184 Encounter for fertility preservation procedure: Secondary | ICD-10-CM | POA: Diagnosis not present

## 2020-09-23 DIAGNOSIS — N979 Female infertility, unspecified: Secondary | ICD-10-CM | POA: Diagnosis not present

## 2020-09-25 DIAGNOSIS — N979 Female infertility, unspecified: Secondary | ICD-10-CM | POA: Diagnosis not present

## 2020-09-25 DIAGNOSIS — Z3183 Encounter for assisted reproductive fertility procedure cycle: Secondary | ICD-10-CM | POA: Diagnosis not present

## 2020-09-25 DIAGNOSIS — Z3184 Encounter for fertility preservation procedure: Secondary | ICD-10-CM | POA: Diagnosis not present

## 2020-09-27 DIAGNOSIS — N979 Female infertility, unspecified: Secondary | ICD-10-CM | POA: Diagnosis not present

## 2020-09-27 DIAGNOSIS — Z3183 Encounter for assisted reproductive fertility procedure cycle: Secondary | ICD-10-CM | POA: Diagnosis not present

## 2020-09-28 DIAGNOSIS — Z3183 Encounter for assisted reproductive fertility procedure cycle: Secondary | ICD-10-CM | POA: Diagnosis not present

## 2020-09-28 DIAGNOSIS — N979 Female infertility, unspecified: Secondary | ICD-10-CM | POA: Diagnosis not present

## 2020-09-28 DIAGNOSIS — Z319 Encounter for procreative management, unspecified: Secondary | ICD-10-CM | POA: Diagnosis not present

## 2020-09-28 DIAGNOSIS — E288 Other ovarian dysfunction: Secondary | ICD-10-CM | POA: Diagnosis not present

## 2020-09-30 ENCOUNTER — Encounter: Payer: Self-pay | Admitting: Internal Medicine

## 2020-09-30 DIAGNOSIS — Z3183 Encounter for assisted reproductive fertility procedure cycle: Secondary | ICD-10-CM | POA: Diagnosis not present

## 2020-09-30 DIAGNOSIS — N979 Female infertility, unspecified: Secondary | ICD-10-CM | POA: Diagnosis not present

## 2020-10-11 ENCOUNTER — Encounter: Payer: Self-pay | Admitting: Internal Medicine

## 2020-10-11 ENCOUNTER — Other Ambulatory Visit (HOSPITAL_COMMUNITY): Payer: Self-pay

## 2020-10-11 MED ORDER — LISDEXAMFETAMINE DIMESYLATE 30 MG PO CAPS
ORAL_CAPSULE | Freq: Every day | ORAL | 0 refills | Status: DC
  Filled 2020-10-11: qty 30, 30d supply, fill #0

## 2020-10-16 ENCOUNTER — Other Ambulatory Visit (HOSPITAL_BASED_OUTPATIENT_CLINIC_OR_DEPARTMENT_OTHER): Payer: Self-pay

## 2020-11-22 ENCOUNTER — Other Ambulatory Visit (HOSPITAL_COMMUNITY): Payer: Self-pay

## 2020-11-22 ENCOUNTER — Other Ambulatory Visit: Payer: Self-pay | Admitting: Internal Medicine

## 2020-11-22 ENCOUNTER — Encounter: Payer: Self-pay | Admitting: Internal Medicine

## 2020-11-24 MED ORDER — LISDEXAMFETAMINE DIMESYLATE 30 MG PO CAPS
ORAL_CAPSULE | Freq: Every day | ORAL | 0 refills | Status: DC
  Filled 2020-11-24: qty 30, 30d supply, fill #0

## 2020-11-25 ENCOUNTER — Other Ambulatory Visit (HOSPITAL_COMMUNITY): Payer: Self-pay

## 2020-11-25 MED ORDER — LISDEXAMFETAMINE DIMESYLATE 30 MG PO CAPS
ORAL_CAPSULE | Freq: Every day | ORAL | 0 refills | Status: DC
  Filled 2020-11-25 (×2): qty 30, 30d supply, fill #0

## 2021-01-03 ENCOUNTER — Encounter: Payer: Self-pay | Admitting: Internal Medicine

## 2021-01-06 ENCOUNTER — Other Ambulatory Visit (HOSPITAL_COMMUNITY): Payer: Self-pay

## 2021-01-06 MED ORDER — LISDEXAMFETAMINE DIMESYLATE 30 MG PO CAPS
ORAL_CAPSULE | Freq: Every day | ORAL | 0 refills | Status: DC
  Filled 2021-01-06 – 2021-01-15 (×2): qty 30, 30d supply, fill #0

## 2021-01-07 ENCOUNTER — Other Ambulatory Visit (HOSPITAL_COMMUNITY): Payer: Self-pay

## 2021-01-07 DIAGNOSIS — H5213 Myopia, bilateral: Secondary | ICD-10-CM | POA: Diagnosis not present

## 2021-01-07 DIAGNOSIS — H52223 Regular astigmatism, bilateral: Secondary | ICD-10-CM | POA: Diagnosis not present

## 2021-01-07 MED ORDER — AZELASTINE HCL 0.05 % OP SOLN
OPHTHALMIC | 1 refills | Status: DC
  Filled 2021-01-07: qty 6, 24d supply, fill #0
  Filled 2021-01-15: qty 6, 30d supply, fill #0

## 2021-01-14 ENCOUNTER — Other Ambulatory Visit (HOSPITAL_COMMUNITY): Payer: Self-pay

## 2021-01-15 ENCOUNTER — Other Ambulatory Visit (HOSPITAL_COMMUNITY): Payer: Self-pay

## 2021-03-02 MED ORDER — LISDEXAMFETAMINE DIMESYLATE 30 MG PO CAPS
ORAL_CAPSULE | Freq: Every day | ORAL | 0 refills | Status: DC
  Filled 2021-03-02: qty 30, 30d supply, fill #0

## 2021-03-02 NOTE — Addendum Note (Signed)
Addended by: Pincus Sanes on: 03/02/2021 07:40 PM   Modules accepted: Orders

## 2021-03-03 ENCOUNTER — Other Ambulatory Visit (HOSPITAL_COMMUNITY): Payer: Self-pay

## 2021-03-17 ENCOUNTER — Ambulatory Visit: Payer: 59 | Admitting: Internal Medicine

## 2021-03-27 NOTE — Patient Instructions (Addendum)
° ° ° °  Blood work was ordered.     Medications changes include :   start bystolic 5 mg daily   Your prescription(s) have been sent to your pharmacy.      Return in about 6 months (around 09/25/2021) for CPE.

## 2021-03-27 NOTE — Progress Notes (Signed)
Subjective:    Patient ID: Candace [name], female    DOB: [birth year], 41 y.o.   MRN: [id number]  This visit occurred during the SARS-CoV-2 public health emergency.  Safety protocols were in place, including screening questions prior to the visit, additional usage of staff PPE, and extensive cleaning of exam room while observing appropriate contact time as indicated for disinfecting solutions.     HPI The patient is here for follow up of their chronic medical problems, including htn, prediabetes, obesity, ADD, anxiety, depression, OSA, vit d def  She is compliant with a low sugar/carb diet.  She is not exercising regularly - will try to do it more regularly.  She has not been checking her blood pressure regularly.  Medications and allergies reviewed with patient and updated if appropriate.  Patient Active Problem List   Diagnosis Date Noted   Anxiety and depression 07/25/2019   ADD (attention deficit disorder) 06/22/2018   Prediabetes 03/23/2017   OSA (obstructive sleep apnea) 01/08/2017   Obesity 06/26/2016   Low back pain 09/23/2015   Vitamin D deficiency 08/12/2015   Alopecia 06/11/2014   Multinodular thyroid 12/01/2011   Insomnia 11/26/2011   Cervical polyp 11/26/2011   Eczema 11/26/2011   Essential hypertension, benign 11/25/2011    Current Outpatient Medications on File Prior to Visit  Medication Sig Dispense Refill   lisdexamfetamine (VYVANSE) 30 MG capsule TAKE 1 CAPSULE BY MOUTH ONCE A DAY 30 capsule 0   triamcinolone cream (KENALOG) 0.1 % APPLY TOPICALLY TWICE A DAY AS NEEDED. NOT FOR MORE THAN 2 WEEKS AT A TIME. 30 g 5   NEEDLE, DISP, 30 G 30G X 1/2" MISC Use as directed (Patient not taking: Reported on 03/28/2021) 20 each 1   No current facility-administered medications on file prior to visit.    Past Medical History:  Diagnosis Date   Anxiety    no meds   Depression    no meds   Hypertension    does not take BP med regularly, instructed to take med  nest 3 days.    Past Surgical History:  Procedure Laterality Date   DILATION AND CURETTAGE OF UTERUS     x 2 polyps removed   DILATION AND EVACUATION N/A 02/10/2013   Procedure: DILATATION AND EVACUATION;  Surgeon: Marylynn Pearson, MD;  Location: [facility];  Service: Gynecology;  Laterality: N/A;  with intra operative [place]   [place]     patient denies having spine surgery   WISDOM TOOTH EXTRACTION  03/2014    Social History   Socioeconomic History   Marital status: Single    Spouse name: n/a   Number of children: 0   Years of education: Not on file   Highest education level: Not on file  Occupational History   Occupation: assessment counselor    Comment: [employer]  Tobacco Use   Smoking status: Never   Smokeless tobacco: Never  Vaping Use   Vaping Use: Never used  Substance and Sexual Activity   Alcohol use: Yes    Alcohol/week: 3.0 standard drinks    Types: 3 Standard drinks or equivalent per week    Comment: weekends - mixed drinks   Drug use: No   Sexual activity: Yes    Partners: Male    Birth control/protection: None  Other Topics Concern   Not on file  Social History Narrative   Works in ED - behavioral assessments      Exercise:  none  Social Determinants of Health   Financial Resource Strain: Not on file  Food Insecurity: Not on file  Transportation Needs: Not on file  Physical Activity: Not on file  Stress: Not on file  Social Connections: Not on file    Family History  Problem Relation Age of Onset   Diabetes Father    Hypertension Father    Hypertension Sister    Hypertension Brother    Diabetes Maternal Grandmother    Hypertension Maternal Grandmother     Review of Systems  Constitutional:  Negative for chills and fever.  Respiratory:  Negative for cough, shortness of breath and wheezing.   Cardiovascular:  Negative for chest pain, palpitations and leg swelling.  Genitourinary:  Positive for urgency. Negative for dysuria and  hematuria.       Urinary incontinence - can not get to the bathroom in time - getting worse.  No loss of urine with cough,sneeze  Neurological:  Positive for headaches (occ). Negative for light-headedness.      Objective:   Vitals:   03/28/21 0920  BP: (!) 144/96  Pulse: 74  Temp: 98.6 F (37 C)  SpO2: 98%   BP Readings from Last 3 Encounters:  03/28/21 (!) 144/96  09/13/20 124/80  02/05/20 (!) 146/88   Wt Readings from Last 3 Encounters:  03/28/21 175 lb 6 oz (79.5 kg)  09/13/20 172 lb (78 kg)  02/05/20 193 lb (87.5 kg)   Body mass index is 30.1 kg/m.   Physical Exam    Constitutional: Appears well-developed and well-nourished. No distress.  HENT:  Head: Normocephalic and atraumatic.  Neck: Neck supple. No tracheal deviation present. No thyromegaly present.  No cervical lymphadenopathy Cardiovascular: Normal rate, regular rhythm and normal heart sounds.   No murmur heard. No carotid bruit .  No edema Pulmonary/Chest: Effort normal and breath sounds normal. No respiratory distress. No has no wheezes. No rales.  Skin: Skin is warm and dry. Not diaphoretic.  Psychiatric: Normal mood and affect. Behavior is normal.      Assessment & Plan:    See Problem List for Assessment and Plan of chronic medical problems.

## 2021-03-28 ENCOUNTER — Other Ambulatory Visit (HOSPITAL_COMMUNITY): Payer: Self-pay

## 2021-03-28 ENCOUNTER — Ambulatory Visit: Payer: 59 | Admitting: Internal Medicine

## 2021-03-28 ENCOUNTER — Encounter: Payer: Self-pay | Admitting: Internal Medicine

## 2021-03-28 ENCOUNTER — Other Ambulatory Visit: Payer: Self-pay

## 2021-03-28 ENCOUNTER — Ambulatory Visit (INDEPENDENT_AMBULATORY_CARE_PROVIDER_SITE_OTHER): Payer: 59 | Admitting: Internal Medicine

## 2021-03-28 VITALS — BP 144/96 | HR 74 | Temp 98.6°F | Ht 64.0 in | Wt 175.4 lb

## 2021-03-28 DIAGNOSIS — I10 Essential (primary) hypertension: Secondary | ICD-10-CM | POA: Diagnosis not present

## 2021-03-28 DIAGNOSIS — F419 Anxiety disorder, unspecified: Secondary | ICD-10-CM | POA: Diagnosis not present

## 2021-03-28 DIAGNOSIS — Z6832 Body mass index (BMI) 32.0-32.9, adult: Secondary | ICD-10-CM

## 2021-03-28 DIAGNOSIS — R7303 Prediabetes: Secondary | ICD-10-CM

## 2021-03-28 DIAGNOSIS — E6609 Other obesity due to excess calories: Secondary | ICD-10-CM | POA: Diagnosis not present

## 2021-03-28 DIAGNOSIS — F988 Other specified behavioral and emotional disorders with onset usually occurring in childhood and adolescence: Secondary | ICD-10-CM

## 2021-03-28 DIAGNOSIS — G4733 Obstructive sleep apnea (adult) (pediatric): Secondary | ICD-10-CM | POA: Diagnosis not present

## 2021-03-28 DIAGNOSIS — D649 Anemia, unspecified: Secondary | ICD-10-CM

## 2021-03-28 DIAGNOSIS — N3941 Urge incontinence: Secondary | ICD-10-CM

## 2021-03-28 DIAGNOSIS — E559 Vitamin D deficiency, unspecified: Secondary | ICD-10-CM

## 2021-03-28 DIAGNOSIS — F32A Depression, unspecified: Secondary | ICD-10-CM

## 2021-03-28 LAB — COMPREHENSIVE METABOLIC PANEL
ALT: 10 U/L (ref 0–35)
AST: 15 U/L (ref 0–37)
Albumin: 4.2 g/dL (ref 3.5–5.2)
Alkaline Phosphatase: 46 U/L (ref 39–117)
BUN: 19 mg/dL (ref 6–23)
CO2: 28 mEq/L (ref 19–32)
Calcium: 8.8 mg/dL (ref 8.4–10.5)
Chloride: 104 mEq/L (ref 96–112)
Creatinine, Ser: 0.73 mg/dL (ref 0.40–1.20)
GFR: 102.87 mL/min (ref >60.00)
Glucose, Bld: 93 mg/dL (ref 70–99)
Potassium: 3.6 mEq/L (ref 3.5–5.1)
Sodium: 136 mEq/L (ref 135–145)
Total Bilirubin: 0.5 mg/dL (ref 0.2–1.2)
Total Protein: 7.3 g/dL (ref 6.0–8.3)

## 2021-03-28 LAB — CBC WITH DIFFERENTIAL/PLATELET
Basophils Absolute: 0 10*3/uL (ref 0.0–0.1)
Basophils Relative: 0.7 % (ref 0.0–3.0)
Eosinophils Absolute: 0.4 10*3/uL (ref 0.0–0.7)
Eosinophils Relative: 7.6 % — ABNORMAL HIGH (ref 0.0–5.0)
HCT: 36.2 % (ref 36.0–46.0)
Hemoglobin: 12 g/dL (ref 12.0–15.0)
Lymphocytes Relative: 23.5 % (ref 12.0–46.0)
Lymphs Abs: 1.3 10*3/uL (ref 0.7–4.0)
MCHC: 33.3 g/dL (ref 30.0–36.0)
MCV: 88.5 fl (ref 78.0–100.0)
Monocytes Absolute: 0.4 10*3/uL (ref 0.1–1.0)
Monocytes Relative: 8.1 % (ref 3.0–12.0)
Neutro Abs: 3.2 10*3/uL (ref 1.4–7.7)
Neutrophils Relative %: 60.1 % (ref 43.0–77.0)
Platelets: 176 10*3/uL (ref 150.0–400.0)
RBC: 4.09 Mil/uL (ref 3.87–5.11)
RDW: 13.5 % (ref 11.5–15.5)
WBC: 5.4 10*3/uL (ref 4.0–10.5)

## 2021-03-28 LAB — VITAMIN D 25 HYDROXY (VIT D DEFICIENCY, FRACTURES): VITD: 14.89 ng/mL — ABNORMAL LOW (ref 30.00–100.00)

## 2021-03-28 LAB — HEMOGLOBIN A1C: Hgb A1c MFr Bld: 5.6 % (ref 4.6–6.5)

## 2021-03-28 MED ORDER — NEBIVOLOL HCL 5 MG PO TABS
5.0000 mg | ORAL_TABLET | Freq: Every day | ORAL | 2 refills | Status: DC
  Filled 2021-03-28: qty 90, 90d supply, fill #0

## 2021-03-28 NOTE — Assessment & Plan Note (Addendum)
Chronic Lost weight - not on cpap No obvious symptoms of uncontrolled sleep apnea, but advised her to monitor for nonrefreshing sleep, fatigue, snoring or other symptoms of sleep apnea

## 2021-03-28 NOTE — Assessment & Plan Note (Signed)
Chronic Advised taking vitamin D daily

## 2021-03-28 NOTE — Assessment & Plan Note (Addendum)
Chronic Blood pressure not ideally controlled Was on medication in the past and discussed that I think she needs to restart it Discussed consequences of uncontrolled BP She does not check BP regularly-encouraged her to try checking it more regularly Start bystolic 5 mg daily-May need to increase to 10 mg daily-she was on that dose previously

## 2021-03-28 NOTE — Assessment & Plan Note (Signed)
Chronic Controlled Continue Vyvanse 30 mg daily Discussed that if she is trying to get pregnant this medication should be stopped

## 2021-03-28 NOTE — Assessment & Plan Note (Signed)
New States urge incontinence She has had this for a while, but has not noticed has gotten worse She tends to have 1 cup of coffee in the morning and typically only drinks water during the day She does wear a pad during the day you can have accidents more than once a day Up-to-date with GYN visits Discussed Kegel exercises, going to the bathroom more frequently She will be starting fertility treatment at the end of summer or early fall so would recommend not taking any medication Discussed pelvic PT Advise discussing also with her gynecologist

## 2021-03-28 NOTE — Assessment & Plan Note (Signed)
Chronic Check a1c Low sugar / carb diet Stressed regular exercise  

## 2021-03-28 NOTE — Assessment & Plan Note (Addendum)
Chronic Not currently seeing a therapist Not currently on any medication Has some anxiety  Feels she is controlling anxiety and depression

## 2021-03-28 NOTE — Assessment & Plan Note (Signed)
Chronic Encouraged healthy diet, decrease portions, diet low in sugars and carbohydrates Encouraged regular exercise

## 2021-03-30 ENCOUNTER — Encounter: Payer: Self-pay | Admitting: Internal Medicine

## 2021-03-30 MED ORDER — VITAMIN D (ERGOCALCIFEROL) 1.25 MG (50000 UNIT) PO CAPS
50000.0000 [IU] | ORAL_CAPSULE | ORAL | 0 refills | Status: AC
  Filled 2021-03-30: qty 8, 56d supply, fill #0

## 2021-03-31 ENCOUNTER — Other Ambulatory Visit (HOSPITAL_COMMUNITY): Payer: Self-pay

## 2021-04-01 ENCOUNTER — Other Ambulatory Visit (HOSPITAL_COMMUNITY): Payer: Self-pay

## 2021-04-07 ENCOUNTER — Ambulatory Visit: Payer: 59 | Admitting: Internal Medicine

## 2021-04-08 ENCOUNTER — Encounter: Payer: Self-pay | Admitting: Internal Medicine

## 2021-04-08 ENCOUNTER — Other Ambulatory Visit (HOSPITAL_COMMUNITY): Payer: Self-pay

## 2021-04-08 ENCOUNTER — Other Ambulatory Visit: Payer: Self-pay | Admitting: Internal Medicine

## 2021-04-08 MED ORDER — LISDEXAMFETAMINE DIMESYLATE 30 MG PO CAPS
ORAL_CAPSULE | Freq: Every day | ORAL | 0 refills | Status: DC
  Filled 2021-04-08: qty 30, 30d supply, fill #0

## 2021-04-11 ENCOUNTER — Ambulatory Visit: Payer: 59 | Admitting: Internal Medicine

## 2021-04-25 DIAGNOSIS — H04123 Dry eye syndrome of bilateral lacrimal glands: Secondary | ICD-10-CM | POA: Diagnosis not present

## 2021-04-25 DIAGNOSIS — H10413 Chronic giant papillary conjunctivitis, bilateral: Secondary | ICD-10-CM | POA: Diagnosis not present

## 2021-05-07 ENCOUNTER — Encounter: Payer: Self-pay | Admitting: Internal Medicine

## 2021-05-07 DIAGNOSIS — R32 Unspecified urinary incontinence: Secondary | ICD-10-CM

## 2021-05-13 ENCOUNTER — Other Ambulatory Visit: Payer: Self-pay | Admitting: Internal Medicine

## 2021-05-13 ENCOUNTER — Other Ambulatory Visit (HOSPITAL_COMMUNITY): Payer: Self-pay

## 2021-05-13 MED ORDER — LISDEXAMFETAMINE DIMESYLATE 30 MG PO CAPS
ORAL_CAPSULE | Freq: Every day | ORAL | 0 refills | Status: DC
  Filled 2021-05-13: qty 30, 30d supply, fill #0

## 2021-05-13 MED ORDER — WEGOVY 0.25 MG/0.5ML ~~LOC~~ SOAJ
0.2500 mg | SUBCUTANEOUS | 0 refills | Status: DC
  Filled 2021-05-13: qty 2, 28d supply, fill #0
  Filled 2021-05-29: qty 2, 30d supply, fill #0

## 2021-05-13 NOTE — Addendum Note (Signed)
Addended by: Pincus Sanes on: 05/13/2021 04:23 PM ? ? Modules accepted: Orders ? ?

## 2021-05-14 ENCOUNTER — Other Ambulatory Visit (HOSPITAL_COMMUNITY): Payer: Self-pay

## 2021-05-16 ENCOUNTER — Other Ambulatory Visit (HOSPITAL_COMMUNITY): Payer: Self-pay

## 2021-05-17 ENCOUNTER — Other Ambulatory Visit (HOSPITAL_COMMUNITY): Payer: Self-pay

## 2021-05-22 ENCOUNTER — Telehealth: Payer: Self-pay

## 2021-05-22 NOTE — Telephone Encounter (Signed)
Candace [name] (Key: [id number]) ? ?[employer] is reviewing your PA request. You may close this dialog, return to your dashboard, and perform other tasks. ? ?To check for an update later, open this request again from your dashboard. If [employer] has not replied within 24 hours for urgent requests or within 48 hours for standard requests, please contact [employer] at [phone]. ?

## 2021-05-23 ENCOUNTER — Other Ambulatory Visit (HOSPITAL_COMMUNITY): Payer: Self-pay

## 2021-05-28 ENCOUNTER — Other Ambulatory Visit (HOSPITAL_COMMUNITY): Payer: Self-pay

## 2021-05-29 ENCOUNTER — Other Ambulatory Visit (HOSPITAL_COMMUNITY): Payer: Self-pay

## 2021-05-31 ENCOUNTER — Other Ambulatory Visit (HOSPITAL_COMMUNITY): Payer: Self-pay

## 2021-05-31 MED ORDER — METFORMIN HCL 500 MG PO TABS
500.0000 mg | ORAL_TABLET | Freq: Two times a day (BID) | ORAL | 3 refills | Status: DC
  Filled 2021-05-31: qty 180, 90d supply, fill #0

## 2021-05-31 NOTE — Addendum Note (Signed)
Addended by: Pincus Sanes on: 05/31/2021 04:18 PM ? ? Modules accepted: Orders ? ?

## 2021-06-02 ENCOUNTER — Other Ambulatory Visit (HOSPITAL_COMMUNITY): Payer: Self-pay

## 2021-06-05 ENCOUNTER — Other Ambulatory Visit (HOSPITAL_COMMUNITY): Payer: Self-pay

## 2021-06-05 MED ORDER — METRONIDAZOLE 0.75 % VA GEL
VAGINAL | 0 refills | Status: DC
  Filled 2021-06-05 – 2021-07-23 (×3): qty 70, 5d supply, fill #0

## 2021-06-13 ENCOUNTER — Other Ambulatory Visit (HOSPITAL_COMMUNITY): Payer: Self-pay

## 2021-06-18 ENCOUNTER — Other Ambulatory Visit (HOSPITAL_COMMUNITY): Payer: Self-pay

## 2021-06-18 ENCOUNTER — Other Ambulatory Visit: Payer: Self-pay

## 2021-06-18 MED ORDER — ADAPALENE-BENZOYL PEROXIDE 0.3-2.5 % EX GEL
CUTANEOUS | 2 refills | Status: DC
  Filled 2021-06-18: qty 45, 30d supply, fill #0

## 2021-06-19 ENCOUNTER — Encounter: Payer: Self-pay | Admitting: Internal Medicine

## 2021-06-19 ENCOUNTER — Other Ambulatory Visit (HOSPITAL_COMMUNITY): Payer: Self-pay

## 2021-06-19 ENCOUNTER — Other Ambulatory Visit: Payer: Self-pay | Admitting: Internal Medicine

## 2021-06-19 MED ORDER — LISDEXAMFETAMINE DIMESYLATE 30 MG PO CAPS
ORAL_CAPSULE | Freq: Every day | ORAL | 0 refills | Status: DC
Start: 2021-06-19 — End: 2021-08-26
  Filled 2021-06-19 – 2021-07-23 (×3): qty 30, 30d supply, fill #0

## 2021-06-28 ENCOUNTER — Other Ambulatory Visit (HOSPITAL_COMMUNITY): Payer: Self-pay

## 2021-07-02 ENCOUNTER — Other Ambulatory Visit (HOSPITAL_COMMUNITY): Payer: Self-pay

## 2021-07-03 ENCOUNTER — Other Ambulatory Visit: Payer: Self-pay | Admitting: Internal Medicine

## 2021-07-03 ENCOUNTER — Other Ambulatory Visit (HOSPITAL_COMMUNITY): Payer: Self-pay

## 2021-07-03 MED ORDER — TRIAMCINOLONE ACETONIDE 0.1 % EX CREA
TOPICAL_CREAM | CUTANEOUS | 5 refills | Status: AC
  Filled 2021-07-03: qty 30, 14d supply, fill #0
  Filled 2021-07-23: qty 30, 30d supply, fill #0
  Filled 2021-12-29: qty 30, 30d supply, fill #1

## 2021-07-04 ENCOUNTER — Other Ambulatory Visit (HOSPITAL_COMMUNITY): Payer: Self-pay

## 2021-07-12 ENCOUNTER — Other Ambulatory Visit (HOSPITAL_COMMUNITY): Payer: Self-pay

## 2021-07-23 ENCOUNTER — Other Ambulatory Visit (HOSPITAL_COMMUNITY): Payer: Self-pay

## 2021-07-24 ENCOUNTER — Other Ambulatory Visit (HOSPITAL_COMMUNITY): Payer: Self-pay

## 2021-07-25 ENCOUNTER — Other Ambulatory Visit (HOSPITAL_COMMUNITY): Payer: Self-pay

## 2021-07-25 DIAGNOSIS — Z79899 Other long term (current) drug therapy: Secondary | ICD-10-CM | POA: Diagnosis not present

## 2021-07-25 DIAGNOSIS — L83 Acanthosis nigricans: Secondary | ICD-10-CM | POA: Diagnosis not present

## 2021-07-25 DIAGNOSIS — L700 Acne vulgaris: Secondary | ICD-10-CM | POA: Diagnosis not present

## 2021-07-25 DIAGNOSIS — L818 Other specified disorders of pigmentation: Secondary | ICD-10-CM | POA: Diagnosis not present

## 2021-07-25 DIAGNOSIS — Z792 Long term (current) use of antibiotics: Secondary | ICD-10-CM | POA: Diagnosis not present

## 2021-07-25 MED ORDER — ONDANSETRON 4 MG PO TBDP
ORAL_TABLET | ORAL | 1 refills | Status: DC
  Filled 2021-07-25: qty 30, 8d supply, fill #0

## 2021-07-29 ENCOUNTER — Other Ambulatory Visit (HOSPITAL_COMMUNITY): Payer: Self-pay

## 2021-08-26 ENCOUNTER — Other Ambulatory Visit: Payer: Self-pay | Admitting: Internal Medicine

## 2021-08-26 ENCOUNTER — Other Ambulatory Visit (HOSPITAL_COMMUNITY): Payer: Self-pay

## 2021-08-26 MED ORDER — LISDEXAMFETAMINE DIMESYLATE 30 MG PO CAPS
ORAL_CAPSULE | Freq: Every day | ORAL | 0 refills | Status: DC
  Filled 2021-08-26: qty 30, 30d supply, fill #0

## 2021-09-15 ENCOUNTER — Other Ambulatory Visit (HOSPITAL_COMMUNITY): Payer: Self-pay

## 2021-09-16 ENCOUNTER — Other Ambulatory Visit (HOSPITAL_COMMUNITY): Payer: Self-pay

## 2021-09-16 MED ORDER — SPIRONOLACTONE 50 MG PO TABS
ORAL_TABLET | ORAL | 2 refills | Status: DC
  Filled 2021-09-16: qty 60, 30d supply, fill #0
  Filled 2021-10-24: qty 60, 30d supply, fill #1
  Filled 2022-04-02: qty 60, 30d supply, fill #2

## 2021-09-16 MED ORDER — MINOCYCLINE HCL 100 MG PO CAPS
ORAL_CAPSULE | ORAL | 1 refills | Status: DC
  Filled 2021-09-16: qty 60, 30d supply, fill #0
  Filled 2022-04-02: qty 60, 30d supply, fill #1

## 2021-09-26 ENCOUNTER — Ambulatory Visit (INDEPENDENT_AMBULATORY_CARE_PROVIDER_SITE_OTHER): Payer: 59 | Admitting: Obstetrics and Gynecology

## 2021-09-26 ENCOUNTER — Other Ambulatory Visit (HOSPITAL_COMMUNITY): Payer: Self-pay

## 2021-09-26 ENCOUNTER — Encounter: Payer: Self-pay | Admitting: Obstetrics and Gynecology

## 2021-09-26 VITALS — BP 128/91 | HR 88 | Ht 64.0 in | Wt 184.0 lb

## 2021-09-26 DIAGNOSIS — N3281 Overactive bladder: Secondary | ICD-10-CM | POA: Diagnosis not present

## 2021-09-26 DIAGNOSIS — R350 Frequency of micturition: Secondary | ICD-10-CM

## 2021-09-26 LAB — POCT URINALYSIS DIPSTICK
Appearance: NORMAL
Bilirubin, UA: NEGATIVE
Blood, UA: NEGATIVE
Glucose, UA: NEGATIVE
Ketones, UA: NEGATIVE
Leukocytes, UA: NEGATIVE
Nitrite, UA: NEGATIVE
Protein, UA: NEGATIVE
Spec Grav, UA: 1.025 (ref 1.010–1.025)
Urobilinogen, UA: 0.2 E.U./dL
pH, UA: 5.5 (ref 5.0–8.0)

## 2021-09-26 MED ORDER — MIRABEGRON ER 25 MG PO TB24
25.0000 mg | ORAL_TABLET | Freq: Every day | ORAL | 5 refills | Status: DC
  Filled 2021-09-26: qty 30, 30d supply, fill #0

## 2021-09-26 NOTE — Progress Notes (Signed)
[facility] New Patient Evaluation and Consultation  Referring Provider: Binnie Rail, MD PCP: Binnie Rail, MD Date of Service: 09/26/2021  SUBJECTIVE Chief Complaint: New Patient (Initial Visit) (Urinary incontinence)  History of Present Illness: Candace [name] is a 41 y.o. Black or African-American female seen in consultation at the request of Dr. Quay Burow for evaluation of incontinence.    Review of records from Dr Quay Burow significant for: Leakage getting worse, cannot get to the bathroom in time.   Urinary Symptoms: Leaks urine with with movement to the bathroom and with urgency. Has been worsening lately, but has been a problem for years.  Leaks 1-3 time(s) per day.  Pad use: 1- 2 pads per day.   She is bothered by her UI symptoms.  Day time voids 6-10.  Nocturia: 1 times per night to void. Voiding dysfunction: she empties her bladder well.  does not use a catheter to empty bladder.  When urinating, she feels she has no difficulties Drinks: 3-4 20oz cup water per day, has coffee maybe 3 times week  UTIs:  0  UTI's in the last year.   Denies history of blood in urine and kidney or bladder stones  Pelvic Organ Prolapse Symptoms:                  She Admits to a feeling of a bulge the vaginal area- bartholin's gland cyst? She Denies seeing a bulge.  This bulge is not bothersome.  Bowel Symptom: Bowel movements: 1-3 time(s) per day, occasionally will have no BM for 1-2 days Stool consistency: soft  Straining: no.  Splinting: no.  Incomplete evacuation: no.  She Denies accidental bowel leakage / fecal incontinence Bowel regimen: none  Sexual Function Sexually active: yes.  Sexual orientation:  heterosexual Pain with sex: has discomfort due to dryness  Pelvic Pain Denies pelvic pain   Past Medical History:  Past Medical History:  Diagnosis Date   Anxiety    no meds   Depression    no meds   Hypertension    does not take BP med regularly,  instructed to take med nest 3 days.     Past Surgical History:   Past Surgical History:  Procedure Laterality Date   DILATION AND CURETTAGE OF UTERUS     x 2 polyps removed   DILATION AND EVACUATION N/A 02/10/2013   Procedure: DILATATION AND EVACUATION;  Surgeon: Marylynn Pearson, MD;  Location: [facility];  Service: Gynecology;  Laterality: N/A;  with intra operative [place]   WISDOM TOOTH EXTRACTION  03/05/2014     Past OB/GYN History: OB History  Gravida Para Term Preterm AB Living  3 0     3    SAB IAB Ectopic Multiple Live Births  2 1          # Outcome Date GA Lbr Len/2nd Weight Sex Delivery Anes PTL Lv  3 SAB           2 SAB           1 IAB              Birth Comments: System Generated. Please review and update pregnancy details.   Egg retrieval last year then is planning on starting IVF next year.  Patient's last menstrual period was 09/26/2021 (approximate). Has regular periods Contraception: none.  Any history of abnormal pap smears: no.   Medications: She has a current medication list which includes the following prescription(s): lisdexamfetamine, minocycline, mirabegron er,  nebivolol, spironolactone, triamcinolone cream, metformin, needle (disp) 30 g, ondansetron, and wegovy.   Allergies: Patient is allergic to amlodipine.   Social History:  Social History   Tobacco Use   Smoking status: Never   Smokeless tobacco: Never  Vaping Use   Vaping Use: Never used  Substance Use Topics   Alcohol use: Not Currently    Alcohol/week: 3.0 standard drinks of alcohol    Types: 3 Standard drinks or equivalent per week    Comment: weekends - mixed drinks   Drug use: No   Relationship status: Engaged  Family History:   Family History  Problem Relation Age of Onset   Diabetes Father    Hypertension Father    Hypertension Sister    Hypertension Brother    Diabetes Maternal Grandmother    Hypertension Maternal Grandmother      Review of Systems: Review of Systems   Constitutional:  Negative for fever, malaise/fatigue and weight loss.  Respiratory:  Negative for cough, shortness of breath and wheezing.   Cardiovascular:  Positive for leg swelling. Negative for chest pain and palpitations.  Gastrointestinal:  Negative for abdominal pain and blood in stool.  Genitourinary:  Negative for dysuria.  Musculoskeletal:  Negative for myalgias.  Skin:  Negative for rash.  Neurological:  Negative for dizziness and headaches.  Endo/Heme/Allergies:  Does not bruise/bleed easily.  Psychiatric/Behavioral:  Positive for depression. The patient is nervous/anxious.      OBJECTIVE Physical Exam: Vitals:   09/26/21 1533  BP: (!) 128/91  Pulse: 88  Weight: 184 lb (83.5 kg)  Height: 5\' 4"  (1.626 m)    Physical Exam Constitutional:      General: She is not in acute distress. Pulmonary:     Effort: Pulmonary effort is normal.  Abdominal:     General: There is no distension.     Palpations: Abdomen is soft.     Tenderness: There is no abdominal tenderness. There is no rebound.  Musculoskeletal:        General: No swelling. Normal range of motion.  Skin:    General: Skin is warm and dry.     Findings: No rash.  Neurological:     Mental Status: She is alert and oriented to person, place, and time.  Psychiatric:        Mood and Affect: Mood normal.        Behavior: Behavior normal.      GU / Detailed Urogynecologic Evaluation:  Pelvic Exam: Normal external female genitalia; Bartholin's and Skene's glands normal in appearance; urethral meatus normal in appearance, no urethral masses or discharge.   CST: negative  Speculum exam reveals normal vaginal mucosa without atrophy. Cervix normal appearance. Uterus normal single, nontender. Adnexa no mass, fullness, tenderness.     Pelvic floor strength I/V Pelvic floor musculature: Right levator non-tender, Right obturator non-tender, Left levator non-tender, Left obturator non-tender  POP-Q:    POP-Q  -3                                            Aa   -3                                           Ba  -8  C   3                                            Gh  3                                            Pb  9                                            tvl   -3                                            Ap  -3                                            Bp  -9                                              D     Rectal Exam:  Normal external rectum  Post-Void Residual (PVR) by Bladder Scan: In order to evaluate bladder emptying, we discussed obtaining a postvoid residual and she agreed to this procedure.  Procedure: The ultrasound unit was placed on the patient's abdomen in the suprapubic region after the patient had voided. A PVR of 17 ml was obtained by bladder scan.  Laboratory Results: POC  urine: negative   ASSESSMENT AND PLAN Ms. Eiland is a 41 y.o. with:  1. Urinary frequency   2. Overactive bladder     - We discussed the symptoms of overactive bladder (OAB), which include urinary urgency, urinary frequency, nocturia, with or without urge incontinence.  While we do not know the exact etiology of OAB, several treatment options exist. We discussed management including behavioral therapy (decreasing bladder irritants, urge suppression strategies, timed voids, bladder retraining), physical therapy, medication; for refractory cases posterior tibial nerve stimulation, sacral neuromodulation, and intravesical botulinum toxin injection.  - Prescribed Myrbetriq 25mg  daily. For Beta-3 agonist medication, we discussed the potential side effect of elevated blood pressure which is more likely to occur in individuals with uncontrolled hypertension. Also discussed that medications are not recommended in pregnancy so she should stop prior to starting her IVF.  - Referral also placed to pelvic physical therapy  Return 6 weeks     , MD

## 2021-09-29 ENCOUNTER — Encounter: Payer: 59 | Admitting: Internal Medicine

## 2021-10-01 ENCOUNTER — Encounter: Payer: Self-pay | Admitting: Internal Medicine

## 2021-10-01 ENCOUNTER — Encounter: Payer: Self-pay | Admitting: Obstetrics and Gynecology

## 2021-10-02 ENCOUNTER — Other Ambulatory Visit (HOSPITAL_COMMUNITY): Payer: Self-pay

## 2021-10-02 MED ORDER — LISDEXAMFETAMINE DIMESYLATE 30 MG PO CAPS
ORAL_CAPSULE | Freq: Every day | ORAL | 0 refills | Status: DC
  Filled 2021-10-02 – 2021-10-24 (×2): qty 30, 30d supply, fill #0

## 2021-10-07 NOTE — Telephone Encounter (Signed)
Pt was contacted. Pt will come pick up samples and I will give a Coupon card for a discounted price. Pt was very happy and verbalized understanding.

## 2021-10-11 ENCOUNTER — Other Ambulatory Visit (HOSPITAL_COMMUNITY): Payer: Self-pay

## 2021-10-17 DIAGNOSIS — L700 Acne vulgaris: Secondary | ICD-10-CM | POA: Diagnosis not present

## 2021-10-17 DIAGNOSIS — L818 Other specified disorders of pigmentation: Secondary | ICD-10-CM | POA: Diagnosis not present

## 2021-10-17 DIAGNOSIS — L83 Acanthosis nigricans: Secondary | ICD-10-CM | POA: Diagnosis not present

## 2021-10-20 ENCOUNTER — Encounter: Payer: 59 | Admitting: Internal Medicine

## 2021-10-20 NOTE — Patient Instructions (Addendum)
Blood work was ordered.     Medications changes include :      Your prescription(s) have been sent to your pharmacy.    A referral was ordered for XX.     Someone from that office will call you to schedule an appointment.    Return in about 6 months (around 04/21/2022) for follow up.   Health Maintenance, Female Adopting a healthy lifestyle and getting preventive care are important in promoting health and wellness. Ask your health care provider about: The right schedule for you to have regular tests and exams. Things you can do on your own to prevent diseases and keep yourself healthy. What should I know about diet, weight, and exercise? Eat a healthy diet  Eat a diet that includes plenty of vegetables, fruits, low-fat dairy products, and lean protein. Do not eat a lot of foods that are high in solid fats, added sugars, or sodium. Maintain a healthy weight Body mass index (BMI) is used to identify weight problems. It estimates body fat based on height and weight. Your health care provider can help determine your BMI and help you achieve or maintain a healthy weight. Get regular exercise Get regular exercise. This is one of the most important things you can do for your health. Most adults should: Exercise for at least 150 minutes each week. The exercise should increase your heart rate and make you sweat (moderate-intensity exercise). Do strengthening exercises at least twice a week. This is in addition to the moderate-intensity exercise. Spend less time sitting. Even light physical activity can be beneficial. Watch cholesterol and blood lipids Have your blood tested for lipids and cholesterol at 41 years of age, then have this test every 5 years. Have your cholesterol levels checked more often if: Your lipid or cholesterol levels are high. You are older than 41 years of age. You are at high risk for heart disease. What should I know about cancer screening? Depending on  your health history and family history, you may need to have cancer screening at various ages. This may include screening for: Breast cancer. Cervical cancer. Colorectal cancer. Skin cancer. Lung cancer. What should I know about heart disease, diabetes, and high blood pressure? Blood pressure and heart disease High blood pressure causes heart disease and increases the risk of stroke. This is more likely to develop in people who have high blood pressure readings or are overweight. Have your blood pressure checked: Every 3-5 years if you are 70-66 years of age. Every year if you are 85 years old or older. Diabetes Have regular diabetes screenings. This checks your fasting blood sugar level. Have the screening done: Once every three years after age 37 if you are at a normal weight and have a low risk for diabetes. More often and at a younger age if you are overweight or have a high risk for diabetes. What should I know about preventing infection? Hepatitis B If you have a higher risk for hepatitis B, you should be screened for this virus. Talk with your health care provider to find out if you are at risk for hepatitis B infection. Hepatitis C Testing is recommended for: Everyone born from [birth year] through 1965. Anyone with known risk factors for hepatitis C. Sexually transmitted infections (STIs) Get screened for STIs, including gonorrhea and chlamydia, if: You are sexually active and are younger than 41 years of age. You are older than 41 years of age and your health care provider tells you  that you are at risk for this type of infection. Your sexual activity has changed since you were last screened, and you are at increased risk for chlamydia or gonorrhea. Ask your health care provider if you are at risk. Ask your health care provider about whether you are at high risk for HIV. Your health care provider may recommend a prescription medicine to help prevent HIV infection. If you choose to take  medicine to prevent HIV, you should first get tested for HIV. You should then be tested every 3 months for as long as you are taking the medicine. Pregnancy If you are about to stop having your period (premenopausal) and you may become pregnant, seek counseling before you get pregnant. Take 400 to 800 micrograms (mcg) of folic acid every day if you become pregnant. Ask for birth control (contraception) if you want to prevent pregnancy. Osteoporosis and menopause Osteoporosis is a disease in which the bones lose minerals and strength with aging. This can result in bone fractures. If you are 3 years old or older, or if you are at risk for osteoporosis and fractures, ask your health care provider if you should: Be screened for bone loss. Take a calcium or vitamin D supplement to lower your risk of fractures. Be given hormone replacement therapy (HRT) to treat symptoms of menopause. Follow these instructions at home: Alcohol use Do not drink alcohol if: Your health care provider tells you not to drink. You are pregnant, may be pregnant, or are planning to become pregnant. If you drink alcohol: Limit how much you have to: 0-1 drink a day. Know how much alcohol is in your drink. In the [place] one drink equals one 12 oz bottle of beer (355 mL), one 5 oz glass of wine (148 mL), or one 1 oz glass of hard liquor (44 mL). Lifestyle Do not use any products that contain nicotine or tobacco. These products include cigarettes, chewing tobacco, and vaping devices, such as e-cigarettes. If you need help quitting, ask your health care provider. Do not use street drugs. Do not share needles. Ask your health care provider for help if you need support or information about quitting drugs. General instructions Schedule regular health, dental, and eye exams. Stay current with your vaccines. Tell your health care provider if: You often feel depressed. You have ever been abused or do not feel safe at  home. Summary Adopting a healthy lifestyle and getting preventive care are important in promoting health and wellness. Follow your health care provider's instructions about healthy diet, exercising, and getting tested or screened for diseases. Follow your health care provider's instructions on monitoring your cholesterol and blood pressure. This information is not intended to replace advice given to you by your health care provider. Make sure you discuss any questions you have with your health care provider. Document Revised: 06/10/2020 Document Reviewed: 06/10/2020 Elsevier Patient Education  [place].

## 2021-10-20 NOTE — Progress Notes (Signed)
Subjective:    Patient ID: Candace [name], female    DOB: 06/12/1980, 41 y.o.   MRN: [id number]      HPI Candace [name] is here for a Physical exam.      Medications and allergies reviewed with patient and updated if appropriate.  Current Outpatient Medications on File Prior to Visit  Medication Sig Dispense Refill  . minocycline (MINOCIN) 100 MG capsule Take 1 capsule by mouth 2 times daily. 60 capsule 1  . nebivolol (BYSTOLIC) 5 MG tablet Take 1 tablet (5 mg total) by mouth daily. 90 tablet 2  . spironolactone (ALDACTONE) 50 MG tablet Take one tablet by mouth for 2 week then increase to two tablets as tolerated 60 tablet 2  . triamcinolone cream (KENALOG) 0.1 % APPLY TOPICALLY TWICE A DAY AS NEEDED. NOT FOR MORE THAN 2 WEEKS AT A TIME. 30 g 5   No current facility-administered medications on file prior to visit.    Review of Systems     Objective:  There were no vitals filed for this visit. There were no vitals filed for this visit. There is no height or weight on file to calculate BMI.  BP Readings from Last 3 Encounters:  11/18/21 (!) 144/96  10/28/21 124/82  09/26/21 (!) 128/91    Wt Readings from Last 3 Encounters:  11/18/21 188 lb (85.3 kg)  10/28/21 188 lb (85.3 kg)  09/26/21 184 lb (83.5 kg)       Physical Exam Constitutional: She appears well-developed and well-nourished. No distress.  HENT:  Head: Normocephalic and atraumatic.  Right Ear: External ear normal. Normal ear canal and TM Left Ear: External ear normal.  Normal ear canal and TM Mouth/Throat: Oropharynx is clear and moist.  Eyes: Conjunctivae normal.  Neck: Neck supple. No tracheal deviation present. No thyromegaly present.  No carotid bruit  Cardiovascular: Normal rate, regular rhythm and normal heart sounds.   No murmur heard.  No edema. Pulmonary/Chest: Effort normal and breath sounds normal. No respiratory distress. She has no wheezes. She has no rales.  Breast: deferred   Abdominal:  Soft. She exhibits no distension. There is no tenderness.  Lymphadenopathy: She has no cervical adenopathy.  Skin: Skin is warm and dry. She is not diaphoretic.  Psychiatric: She has a normal mood and affect. Her behavior is normal.     Lab Results  Component Value Date   WBC 4.0 10/28/2021   HGB 11.8 (L) 10/28/2021   HCT 35.0 (L) 10/28/2021   PLT 177.0 10/28/2021   GLUCOSE 93 10/28/2021   CHOL 178 10/28/2021   TRIG 81.0 10/28/2021   HDL 48.50 10/28/2021   LDLCALC 114 (H) 10/28/2021   ALT 11 10/28/2021   AST 13 10/28/2021   NA 138 10/28/2021   K 3.6 10/28/2021   CL 105 10/28/2021   CREATININE 0.97 10/28/2021   BUN 26 (H) 10/28/2021   CO2 25 10/28/2021   TSH 2.24 10/28/2021   HGBA1C 5.9 10/28/2021         Assessment & Plan:   Physical exam: Screening blood work  ordered Exercise   Weight   Substance abuse  none   Reviewed recommended immunizations.   Health Maintenance  Topic Date Due  . COVID-19 Vaccine (1) Never done  . Hepatitis C Screening  02/04/2041 (Originally 10/27/1998)  . MAMMOGRAM  12/09/2022  . TETANUS/TDAP  12/29/2025  . PAP SMEAR-Modifier  10/30/2026  . HIV Screening  Completed  . HPV VACCINES  Aged Out  .  INFLUENZA VACCINE  Discontinued          See Problem List for Assessment and Plan of chronic medical problems.     This encounter was created in error - please disregard.

## 2021-10-21 ENCOUNTER — Encounter: Payer: 59 | Admitting: Internal Medicine

## 2021-10-21 ENCOUNTER — Encounter: Payer: Self-pay | Admitting: Internal Medicine

## 2021-10-21 DIAGNOSIS — E559 Vitamin D deficiency, unspecified: Secondary | ICD-10-CM

## 2021-10-21 DIAGNOSIS — F988 Other specified behavioral and emotional disorders with onset usually occurring in childhood and adolescence: Secondary | ICD-10-CM

## 2021-10-21 DIAGNOSIS — I10 Essential (primary) hypertension: Secondary | ICD-10-CM

## 2021-10-21 DIAGNOSIS — Z0000 Encounter for general adult medical examination without abnormal findings: Secondary | ICD-10-CM

## 2021-10-21 DIAGNOSIS — R7303 Prediabetes: Secondary | ICD-10-CM

## 2021-10-21 DIAGNOSIS — N3941 Urge incontinence: Secondary | ICD-10-CM

## 2021-10-21 DIAGNOSIS — E042 Nontoxic multinodular goiter: Secondary | ICD-10-CM

## 2021-10-21 NOTE — Assessment & Plan Note (Signed)
Chronic Following with Dr. Wannetta Sender On Myrbetriq

## 2021-10-21 NOTE — Assessment & Plan Note (Signed)
Chronic Blood pressure well controlled CMP Continue nebivolol 5 mg daily, also taking spironolactone

## 2021-10-21 NOTE — Assessment & Plan Note (Signed)
Chronic Taking vitamin D daily Check vitamin D level  

## 2021-10-21 NOTE — Assessment & Plan Note (Signed)
Chronic Controlled, Stable Continue Vyvanse 30 mg daily

## 2021-10-21 NOTE — Assessment & Plan Note (Signed)
Chronic Has been stable on imaging TSH

## 2021-10-21 NOTE — Assessment & Plan Note (Signed)
Chronic Check a1c Low sugar / carb diet Stressed regular exercise  

## 2021-10-24 ENCOUNTER — Other Ambulatory Visit (HOSPITAL_COMMUNITY): Payer: Self-pay

## 2021-10-24 MED ORDER — ARAZLO 0.045 % EX LOTN
TOPICAL_LOTION | CUTANEOUS | 4 refills | Status: DC
  Filled 2021-10-24 – 2022-04-02 (×2): qty 45, 30d supply, fill #0

## 2021-10-26 NOTE — Patient Instructions (Addendum)
Blood work was ordered.     Medications changes include :   start vitamin 2000 units daily.  Vyvanse 40 mg daily, wegovy 0.25 mg weekly   Your prescription(s) have been sent to your pharmacy.     Return in about 6 months (around 04/28/2022) for follow up.   Health Maintenance, Female Adopting a healthy lifestyle and getting preventive care are important in promoting health and wellness. Ask your health care provider about: The right schedule for you to have regular tests and exams. Things you can do on your own to prevent diseases and keep yourself healthy. What should I know about diet, weight, and exercise? Eat a healthy diet  Eat a diet that includes plenty of vegetables, fruits, low-fat dairy products, and lean protein. Do not eat a lot of foods that are high in solid fats, added sugars, or sodium. Maintain a healthy weight Body mass index (BMI) is used to identify weight problems. It estimates body fat based on height and weight. Your health care provider can help determine your BMI and help you achieve or maintain a healthy weight. Get regular exercise Get regular exercise. This is one of the most important things you can do for your health. Most adults should: Exercise for at least 150 minutes each week. The exercise should increase your heart rate and make you sweat (moderate-intensity exercise). Do strengthening exercises at least twice a week. This is in addition to the moderate-intensity exercise. Spend less time sitting. Even light physical activity can be beneficial. Watch cholesterol and blood lipids Have your blood tested for lipids and cholesterol at 41 years of age, then have this test every 5 years. Have your cholesterol levels checked more often if: Your lipid or cholesterol levels are high. You are older than 41 years of age. You are at high risk for heart disease. What should I know about cancer screening? Depending on your health history and family  history, you may need to have cancer screening at various ages. This may include screening for: Breast cancer. Cervical cancer. Colorectal cancer. Skin cancer. Lung cancer. What should I know about heart disease, diabetes, and high blood pressure? Blood pressure and heart disease High blood pressure causes heart disease and increases the risk of stroke. This is more likely to develop in people who have high blood pressure readings or are overweight. Have your blood pressure checked: Every 3-5 years if you are 31-6 years of age. Every year if you are 70 years old or older. Diabetes Have regular diabetes screenings. This checks your fasting blood sugar level. Have the screening done: Once every three years after age 40 if you are at a normal weight and have a low risk for diabetes. More often and at a younger age if you are overweight or have a high risk for diabetes. What should I know about preventing infection? Hepatitis B If you have a higher risk for hepatitis B, you should be screened for this virus. Talk with your health care provider to find out if you are at risk for hepatitis B infection. Hepatitis C Testing is recommended for: Everyone born from [birth year] through 1965. Anyone with known risk factors for hepatitis C. Sexually transmitted infections (STIs) Get screened for STIs, including gonorrhea and chlamydia, if: You are sexually active and are younger than 42 years of age. You are older than 41 years of age and your health care provider tells you that you are at risk for this type of infection.  Your sexual activity has changed since you were last screened, and you are at increased risk for chlamydia or gonorrhea. Ask your health care provider if you are at risk. Ask your health care provider about whether you are at high risk for HIV. Your health care provider may recommend a prescription medicine to help prevent HIV infection. If you choose to take medicine to prevent HIV, you  should first get tested for HIV. You should then be tested every 3 months for as long as you are taking the medicine. Pregnancy If you are about to stop having your period (premenopausal) and you may become pregnant, seek counseling before you get pregnant. Take 400 to 800 micrograms (mcg) of folic acid every day if you become pregnant. Ask for birth control (contraception) if you want to prevent pregnancy. Osteoporosis and menopause Osteoporosis is a disease in which the bones lose minerals and strength with aging. This can result in bone fractures. If you are 17 years old or older, or if you are at risk for osteoporosis and fractures, ask your health care provider if you should: Be screened for bone loss. Take a calcium or vitamin D supplement to lower your risk of fractures. Be given hormone replacement therapy (HRT) to treat symptoms of menopause. Follow these instructions at home: Alcohol use Do not drink alcohol if: Your health care provider tells you not to drink. You are pregnant, may be pregnant, or are planning to become pregnant. If you drink alcohol: Limit how much you have to: 0-1 drink a day. Know how much alcohol is in your drink. In the [place] one drink equals one 12 oz bottle of beer (355 mL), one 5 oz glass of wine (148 mL), or one 1 oz glass of hard liquor (44 mL). Lifestyle Do not use any products that contain nicotine or tobacco. These products include cigarettes, chewing tobacco, and vaping devices, such as e-cigarettes. If you need help quitting, ask your health care provider. Do not use street drugs. Do not share needles. Ask your health care provider for help if you need support or information about quitting drugs. General instructions Schedule regular health, dental, and eye exams. Stay current with your vaccines. Tell your health care provider if: You often feel depressed. You have ever been abused or do not feel safe at home. Summary Adopting a healthy  lifestyle and getting preventive care are important in promoting health and wellness. Follow your health care provider's instructions about healthy diet, exercising, and getting tested or screened for diseases. Follow your health care provider's instructions on monitoring your cholesterol and blood pressure. This information is not intended to replace advice given to you by your health care provider. Make sure you discuss any questions you have with your health care provider. Document Revised: 06/10/2020 Document Reviewed: 06/10/2020 Elsevier Patient Education  [place].

## 2021-10-26 NOTE — Progress Notes (Unsigned)
Subjective:    Patient ID: Candace [name], female    DOB: 1980-12-28, 41 y.o.   MRN: [id number]      HPI Candace [name] is here for a Physical exam.    Candace [name] is not currently taking any supplements.  Candace [name] is bothered by Candace [name] weight.   Medications and allergies reviewed with patient and updated if appropriate.  Current Outpatient Medications on File Prior to Visit  Medication Sig Dispense Refill   lisdexamfetamine (VYVANSE) 30 MG capsule TAKE 1 CAPSULE BY MOUTH ONCE A DAY 30 capsule 0   minocycline (MINOCIN) 100 MG capsule Take 1 capsule by mouth 2 times daily. 60 capsule 1   mirabegron ER (MYRBETRIQ) 25 MG TB24 tablet Take 1 tablet by mouth daily. 30 tablet 5   nebivolol (BYSTOLIC) 5 MG tablet Take 1 tablet (5 mg total) by mouth daily. 90 tablet 2   spironolactone (ALDACTONE) 50 MG tablet Take one tablet by mouth for 2 week then increase to two tablets as tolerated 60 tablet 2   Tazarotene (ARAZLO) 0.045 % LOTN Apply a pea-sized amount at bedtime 45 g 4   triamcinolone cream (KENALOG) 0.1 % APPLY TOPICALLY TWICE A DAY AS NEEDED. NOT FOR MORE THAN 2 WEEKS AT A TIME. 30 g 5   No current facility-administered medications on file prior to visit.    Review of Systems  Constitutional:  Negative for fever.  Eyes:  Negative for visual disturbance.  Respiratory:  Negative for cough, shortness of breath and wheezing.   Cardiovascular:  Positive for leg swelling (occ if sitting long time, salt intake). Negative for chest pain and palpitations.  Gastrointestinal:  Negative for abdominal pain, blood in stool, constipation, diarrhea and nausea.       No gerd  Genitourinary:  Negative for dysuria.  Musculoskeletal:  Negative for arthralgias and back pain.  Skin:  Negative for rash.  Neurological:  Negative for dizziness, light-headedness and headaches.  Psychiatric/Behavioral:  Negative for dysphoric mood. The patient is nervous/anxious (normal life anxiety - defers needing anything).         Objective:   Vitals:   10/28/21 0900  BP: 124/82  Pulse: 85  Temp: 98.8 F (37.1 C)  SpO2: 98%   Filed Weights   10/28/21 0900  Weight: 188 lb (85.3 kg)   Body mass index is 32.27 kg/m.  BP Readings from Last 3 Encounters:  10/28/21 124/82  09/26/21 (!) 128/91  03/28/21 (!) 144/96    Wt Readings from Last 3 Encounters:  10/28/21 188 lb (85.3 kg)  09/26/21 184 lb (83.5 kg)  03/28/21 175 lb 6 oz (79.5 kg)       Physical Exam Constitutional: Candace [name] appears well-developed and well-nourished. No distress.  HENT:  Head: Normocephalic and atraumatic.  Right Ear: External ear normal. Normal ear canal and TM Left Ear: External ear normal.  Normal ear canal and TM Mouth/Throat: Oropharynx is clear and moist.  Eyes: Conjunctivae normal.  Neck: Neck supple. No tracheal deviation present. No thyromegaly present.  No carotid bruit  Cardiovascular: Normal rate, regular rhythm and normal heart sounds.   No murmur heard.  No edema. Pulmonary/Chest: Effort normal and breath sounds normal. No respiratory distress. Candace [name] has no wheezes. Candace [name] has no rales.  Breast: deferred   Abdominal: Soft. Candace [name] exhibits no distension. There is no tenderness.  Lymphadenopathy: Candace [name] has no cervical adenopathy.  Skin: Skin is warm and dry. Candace [name] is not diaphoretic.  Psychiatric: Candace [name] has a normal mood and affect. Candace [name]  behavior is normal.     Lab Results  Component Value Date   WBC 5.4 03/28/2021   HGB 12.0 03/28/2021   HCT 36.2 03/28/2021   PLT 176.0 03/28/2021   GLUCOSE 93 03/28/2021   CHOL 177 09/13/2020   TRIG 43.0 09/13/2020   HDL 46.40 09/13/2020   LDLCALC 122 (H) 09/13/2020   ALT 10 03/28/2021   AST 15 03/28/2021   NA 136 03/28/2021   K 3.6 03/28/2021   CL 104 03/28/2021   CREATININE 0.73 03/28/2021   BUN 19 03/28/2021   CO2 28 03/28/2021   TSH 1.98 09/13/2020   HGBA1C 5.6 03/28/2021         Assessment & Plan:   Physical exam: Screening blood work  ordered Exercise  could be  better Weight  discussed weight loss Substance abuse  none   Reviewed recommended immunizations.   Health Maintenance  Topic Date Due   COVID-19 Vaccine (1) Never done   MAMMOGRAM  Never done   Hepatitis C Screening  02/04/2041 (Originally 10/27/1998)   PAP SMEAR-Modifier  06/10/2023   TETANUS/TDAP  12/29/2025   HIV Screening  Completed   HPV VACCINES  Aged Out   INFLUENZA VACCINE  Discontinued          See Problem List for Assessment and Plan of chronic medical problems.

## 2021-10-28 ENCOUNTER — Encounter: Payer: Self-pay | Admitting: Internal Medicine

## 2021-10-28 ENCOUNTER — Ambulatory Visit (INDEPENDENT_AMBULATORY_CARE_PROVIDER_SITE_OTHER): Payer: 59 | Admitting: Internal Medicine

## 2021-10-28 ENCOUNTER — Other Ambulatory Visit (HOSPITAL_COMMUNITY): Payer: Self-pay

## 2021-10-28 VITALS — BP 124/82 | HR 85 | Temp 98.8°F | Ht 64.0 in | Wt 188.0 lb

## 2021-10-28 DIAGNOSIS — E6609 Other obesity due to excess calories: Secondary | ICD-10-CM

## 2021-10-28 DIAGNOSIS — E559 Vitamin D deficiency, unspecified: Secondary | ICD-10-CM | POA: Diagnosis not present

## 2021-10-28 DIAGNOSIS — I10 Essential (primary) hypertension: Secondary | ICD-10-CM

## 2021-10-28 DIAGNOSIS — R7303 Prediabetes: Secondary | ICD-10-CM | POA: Diagnosis not present

## 2021-10-28 DIAGNOSIS — Z6832 Body mass index (BMI) 32.0-32.9, adult: Secondary | ICD-10-CM

## 2021-10-28 DIAGNOSIS — Z0000 Encounter for general adult medical examination without abnormal findings: Secondary | ICD-10-CM

## 2021-10-28 DIAGNOSIS — E042 Nontoxic multinodular goiter: Secondary | ICD-10-CM

## 2021-10-28 DIAGNOSIS — F988 Other specified behavioral and emotional disorders with onset usually occurring in childhood and adolescence: Secondary | ICD-10-CM | POA: Diagnosis not present

## 2021-10-28 DIAGNOSIS — N3941 Urge incontinence: Secondary | ICD-10-CM | POA: Diagnosis not present

## 2021-10-28 LAB — CBC WITH DIFFERENTIAL/PLATELET
Basophils Absolute: 0 10*3/uL (ref 0.0–0.1)
Basophils Relative: 0.9 % (ref 0.0–3.0)
Eosinophils Absolute: 0.3 10*3/uL (ref 0.0–0.7)
Eosinophils Relative: 8.6 % — ABNORMAL HIGH (ref 0.0–5.0)
HCT: 35 % — ABNORMAL LOW (ref 36.0–46.0)
Hemoglobin: 11.8 g/dL — ABNORMAL LOW (ref 12.0–15.0)
Lymphocytes Relative: 37 % (ref 12.0–46.0)
Lymphs Abs: 1.5 10*3/uL (ref 0.7–4.0)
MCHC: 33.6 g/dL (ref 30.0–36.0)
MCV: 88.6 fl (ref 78.0–100.0)
Monocytes Absolute: 0.3 10*3/uL (ref 0.1–1.0)
Monocytes Relative: 7.6 % (ref 3.0–12.0)
Neutro Abs: 1.8 10*3/uL (ref 1.4–7.7)
Neutrophils Relative %: 45.9 % (ref 43.0–77.0)
Platelets: 177 10*3/uL (ref 150.0–400.0)
RBC: 3.95 Mil/uL (ref 3.87–5.11)
RDW: 13.5 % (ref 11.5–15.5)
WBC: 4 10*3/uL (ref 4.0–10.5)

## 2021-10-28 LAB — LIPID PANEL
Cholesterol: 178 mg/dL (ref 0–200)
HDL: 48.5 mg/dL (ref >39.00)
LDL Cholesterol: 114 mg/dL — ABNORMAL HIGH (ref 0–99)
NonHDL: 129.91
Total CHOL/HDL Ratio: 4
Triglycerides: 81 mg/dL (ref 0.0–149.0)
VLDL: 16.2 mg/dL (ref 0.0–40.0)

## 2021-10-28 LAB — HEMOGLOBIN A1C: Hgb A1c MFr Bld: 5.9 % (ref 4.6–6.5)

## 2021-10-28 LAB — COMPREHENSIVE METABOLIC PANEL
ALT: 11 U/L (ref 0–35)
AST: 13 U/L (ref 0–37)
Albumin: 4.1 g/dL (ref 3.5–5.2)
Alkaline Phosphatase: 49 U/L (ref 39–117)
BUN: 26 mg/dL — ABNORMAL HIGH (ref 6–23)
CO2: 25 mEq/L (ref 19–32)
Calcium: 9.2 mg/dL (ref 8.4–10.5)
Chloride: 105 mEq/L (ref 96–112)
Creatinine, Ser: 0.97 mg/dL (ref 0.40–1.20)
GFR: 72.84 mL/min (ref >60.00)
Glucose, Bld: 93 mg/dL (ref 70–99)
Potassium: 3.6 mEq/L (ref 3.5–5.1)
Sodium: 138 mEq/L (ref 135–145)
Total Bilirubin: 0.3 mg/dL (ref 0.2–1.2)
Total Protein: 7.3 g/dL (ref 6.0–8.3)

## 2021-10-28 LAB — TSH: TSH: 2.24 u[IU]/mL (ref 0.35–5.50)

## 2021-10-28 MED ORDER — SEMAGLUTIDE-WEIGHT MANAGEMENT 0.25 MG/0.5ML ~~LOC~~ SOAJ
0.2500 mg | SUBCUTANEOUS | 0 refills | Status: DC
  Filled 2021-10-28 – 2022-02-16 (×3): qty 2, 28d supply, fill #0

## 2021-10-28 MED ORDER — LISDEXAMFETAMINE DIMESYLATE 40 MG PO CAPS
40.0000 mg | ORAL_CAPSULE | ORAL | 0 refills | Status: DC
  Filled 2021-10-28: qty 30, 30d supply, fill #0

## 2021-10-28 NOTE — Assessment & Plan Note (Addendum)
Chronic Blood pressure well controlled CMP Continue Bystolic 5 mg daily, spironolactone 100 mg daily which she is taking for different reason

## 2021-10-28 NOTE — Assessment & Plan Note (Signed)
Chronic Seeing Dr Melvia Heaps is helping Will do pelvic PT

## 2021-10-28 NOTE — Assessment & Plan Note (Addendum)
Chronic Not taking any vitamins - advised taking 2000 units daily of otc vitamin d Will not check level today since it will be low

## 2021-10-28 NOTE — Assessment & Plan Note (Signed)
Chronic Check a1c Low sugar / carb diet Stressed regular exercise  

## 2021-10-28 NOTE — Assessment & Plan Note (Addendum)
Chronic He is not having as much focus with Rodney Booze feels its lost some of its effectiveness Will increase Vyvanse to 40 mg daily Advised that if she does not need on the weekends not take it-she thinks this is helped more during the week by not taking on the weekends

## 2021-10-28 NOTE — Assessment & Plan Note (Signed)
Chronic Was stable on imaging so no further imaging necessary TSH

## 2021-10-28 NOTE — Assessment & Plan Note (Signed)
Chronic BMI 32.27, weight 188 pound today She states her exercise could be better-stressed the importance of regular exercise for multiple reasons-goal is 5 days a week Discussed healthy diet, decrease portions-stressed making lifestyle changes that she can stick with She is interested in trying Wegovy-discussed possible side effects Discussed that in order to make the weight loss clinic that she needs to make lifestyle changes and not just rely on medication-we hope the medication will be temporary, but without the lifestyle changes the weight loss will not persist Wegovy 0.25 mg weekly-we will increase after a month if tolerated If medication is approved we will recommend she come back in 3 months to follow-up on lifestyle changes and enforce along with medication

## 2021-10-29 DIAGNOSIS — Z6831 Body mass index (BMI) 31.0-31.9, adult: Secondary | ICD-10-CM | POA: Diagnosis not present

## 2021-10-29 DIAGNOSIS — Z1231 Encounter for screening mammogram for malignant neoplasm of breast: Secondary | ICD-10-CM | POA: Diagnosis not present

## 2021-10-29 DIAGNOSIS — Z1151 Encounter for screening for human papillomavirus (HPV): Secondary | ICD-10-CM | POA: Diagnosis not present

## 2021-10-29 DIAGNOSIS — Z01419 Encounter for gynecological examination (general) (routine) without abnormal findings: Secondary | ICD-10-CM | POA: Diagnosis not present

## 2021-10-29 DIAGNOSIS — Z124 Encounter for screening for malignant neoplasm of cervix: Secondary | ICD-10-CM | POA: Diagnosis not present

## 2021-10-29 LAB — HM PAP SMEAR: HM Pap smear: NORMAL

## 2021-10-29 LAB — HM MAMMOGRAPHY

## 2021-10-30 ENCOUNTER — Other Ambulatory Visit (HOSPITAL_COMMUNITY): Payer: Self-pay

## 2021-10-30 ENCOUNTER — Other Ambulatory Visit: Payer: Self-pay | Admitting: Obstetrics and Gynecology

## 2021-10-30 ENCOUNTER — Telehealth: Payer: Self-pay

## 2021-10-30 DIAGNOSIS — R928 Other abnormal and inconclusive findings on diagnostic imaging of breast: Secondary | ICD-10-CM

## 2021-10-30 NOTE — Telephone Encounter (Signed)
Candace [name] (Key: [id number]) Rx #: [id number] JOACZY 0.25MG /0.5ML auto-injectors   Form Med[employer]act ePA Form 2017 NCPDP

## 2021-10-31 ENCOUNTER — Other Ambulatory Visit (HOSPITAL_COMMUNITY): Payer: Self-pay

## 2021-11-04 NOTE — Telephone Encounter (Signed)
Approval received today.  Good from 10/31/2021 - 05/22/2022.  Patient notified via my-chart.

## 2021-11-06 ENCOUNTER — Other Ambulatory Visit (HOSPITAL_COMMUNITY): Payer: Self-pay

## 2021-11-07 ENCOUNTER — Encounter: Payer: Self-pay | Admitting: Internal Medicine

## 2021-11-07 ENCOUNTER — Other Ambulatory Visit (HOSPITAL_COMMUNITY): Payer: Self-pay

## 2021-11-07 NOTE — Progress Notes (Signed)
Outside notes received. Information abstracted. Notes sent to scan.  

## 2021-11-08 ENCOUNTER — Encounter: Payer: Self-pay | Admitting: Internal Medicine

## 2021-11-12 ENCOUNTER — Other Ambulatory Visit (HOSPITAL_COMMUNITY): Payer: Self-pay

## 2021-11-18 ENCOUNTER — Other Ambulatory Visit (HOSPITAL_COMMUNITY): Payer: Self-pay

## 2021-11-18 ENCOUNTER — Ambulatory Visit (INDEPENDENT_AMBULATORY_CARE_PROVIDER_SITE_OTHER): Payer: 59 | Admitting: Obstetrics and Gynecology

## 2021-11-18 ENCOUNTER — Encounter: Payer: Self-pay | Admitting: Obstetrics and Gynecology

## 2021-11-18 VITALS — BP 144/96 | HR 64 | Ht 64.0 in | Wt 188.0 lb

## 2021-11-18 DIAGNOSIS — N3281 Overactive bladder: Secondary | ICD-10-CM

## 2021-11-18 MED ORDER — MIRABEGRON ER 50 MG PO TB24
50.0000 mg | ORAL_TABLET | Freq: Every day | ORAL | 5 refills | Status: DC
  Filled 2021-11-18: qty 30, 30d supply, fill #0

## 2021-11-18 NOTE — Progress Notes (Signed)
[facility] Return Visit  SUBJECTIVE  History of Present Illness: Candace [name] is a 41 y.o. female seen in follow-up for overactive bladder. Plan at last visit was to start Myrbetriq 25mg  daily.   Not having accidents every day- had about 2 last week. She can hold her urine for several hours but still has a lot of urgency throughout the day.   Has not yet filled the prescription- still using samples.   Past Medical History: Patient  has a past medical history of Anxiety, Depression, and Hypertension.   Past Surgical History: She  has a past surgical history that includes Dilation and curettage of uterus; Dilation and evacuation (N/A, 02/10/2013); and Wisdom tooth extraction (03/05/2014).   Medications: She has a current medication list which includes the following prescription(s): lisdexamfetamine, minocycline, mirabegron er, nebivolol, spironolactone, arazlo, triamcinolone cream, and semaglutide-weight management.   Allergies: Patient is allergic to amlodipine.   Social History: Patient  reports that she has never smoked. She has never used smokeless tobacco. She reports that she does not currently use alcohol after a past usage of about 3.0 standard drinks of alcohol per week. She reports that she does not use drugs.      OBJECTIVE     Physical Exam: Vitals:   11/18/21 1413  BP: (!) 144/96  Pulse: 64  Weight: 188 lb (85.3 kg)  Height: 5\' 4"  (1.626 m)   Gen: No apparent distress, A&O x 3.  Detailed Urogynecologic Evaluation:  Deferred.    ASSESSMENT AND PLAN    Candace [name] is a 41 y.o. with:  1. Overactive bladder    - Will increase Myrbteriq to 50mg . Samples and discount card provided.   Follow up 3 months or sooner if needed  Jaquita Folds, MD

## 2021-11-24 ENCOUNTER — Encounter: Payer: Self-pay | Admitting: *Deleted

## 2021-11-28 ENCOUNTER — Other Ambulatory Visit (HOSPITAL_COMMUNITY): Payer: Self-pay

## 2021-12-08 ENCOUNTER — Ambulatory Visit: Payer: 59

## 2021-12-08 ENCOUNTER — Ambulatory Visit
Admission: RE | Admit: 2021-12-08 | Discharge: 2021-12-08 | Disposition: A | Discharge status: Home / Self Care | Payer: 59 | Source: Ambulatory Visit | Attending: Obstetrics and Gynecology | Admitting: Obstetrics and Gynecology

## 2021-12-08 DIAGNOSIS — R928 Other abnormal and inconclusive findings on diagnostic imaging of breast: Secondary | ICD-10-CM

## 2021-12-08 DIAGNOSIS — N6321 Unspecified lump in the left breast, upper outer quadrant: Secondary | ICD-10-CM | POA: Diagnosis not present

## 2021-12-08 DIAGNOSIS — R92323 Mammographic fibroglandular density, bilateral breasts: Secondary | ICD-10-CM | POA: Diagnosis not present

## 2021-12-16 ENCOUNTER — Other Ambulatory Visit: Payer: Self-pay | Admitting: Internal Medicine

## 2021-12-16 ENCOUNTER — Encounter: Payer: Self-pay | Admitting: Internal Medicine

## 2021-12-16 ENCOUNTER — Other Ambulatory Visit (HOSPITAL_COMMUNITY): Payer: Self-pay

## 2021-12-16 MED ORDER — LISDEXAMFETAMINE DIMESYLATE 40 MG PO CAPS
40.0000 mg | ORAL_CAPSULE | ORAL | 0 refills | Status: DC
  Filled 2021-12-16 – 2021-12-29 (×2): qty 30, 30d supply, fill #0

## 2021-12-17 ENCOUNTER — Other Ambulatory Visit (HOSPITAL_COMMUNITY): Payer: Self-pay

## 2021-12-22 ENCOUNTER — Other Ambulatory Visit (HOSPITAL_COMMUNITY): Payer: Self-pay

## 2021-12-26 ENCOUNTER — Other Ambulatory Visit (HOSPITAL_COMMUNITY): Payer: Self-pay

## 2021-12-29 ENCOUNTER — Other Ambulatory Visit (HOSPITAL_COMMUNITY): Payer: Self-pay

## 2021-12-29 IMAGING — DX DG CHEST 1V PORT
1 series · 1 of 1 positions shown · non-contrast
Comparison: None available.

CLINICAL DATA: Initial evaluation for acute cough, congestion,
positive COVID.

EXAM:
PORTABLE CHEST 1 VIEW

[chest ap]
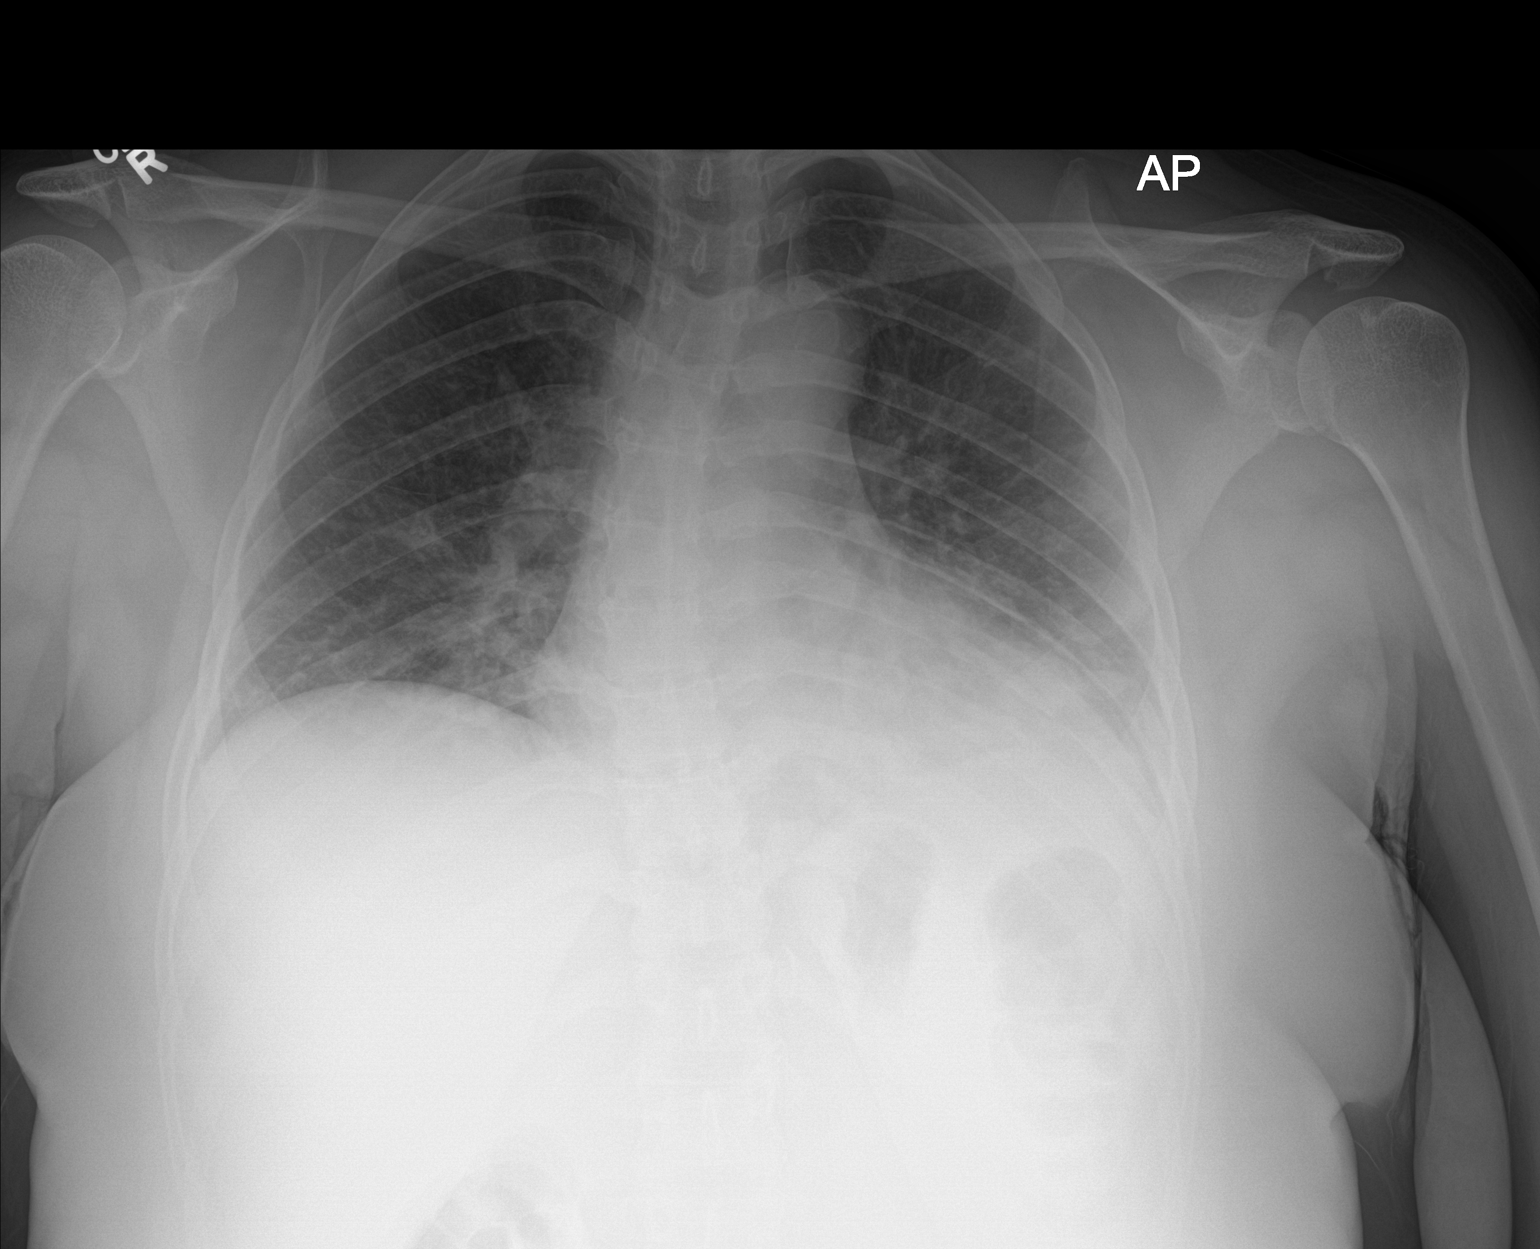

[1 of 1 positions shown; findings below may reference images not displayed]

FINDINGS: Exaggeration of the cardiac silhouette related to AP technique and
low lung volumes. Mediastinal silhouette normal.

Lungs hypoinflated. Diffuse interstitial prominence seen throughout
both lungs, suspected be related to history of acute viral
pneumonitis. Superimposed patchy left lower lobe opacity could
reflect atelectasis and/or infiltrate. No overt pulmonary edema. No
definite pleural effusion.

Thoracic dextroscoliosis noted.  No acute osseous abnormality.
IMPRESSION: Diffuse interstitial prominence throughout both lungs, most likely
related to history of acute viral pneumonitis. Superimposed patchy
left lower lobe opacity could reflect atelectasis and/or infiltrate.

## 2021-12-29 MED ORDER — METRONIDAZOLE 0.75 % VA GEL
1.0000 | Freq: Every day | VAGINAL | 0 refills | Status: AC
  Filled 2021-12-29: qty 70, 7d supply, fill #0

## 2022-01-14 ENCOUNTER — Other Ambulatory Visit (HOSPITAL_COMMUNITY): Payer: Self-pay

## 2022-01-16 ENCOUNTER — Encounter: Payer: Self-pay | Admitting: Podiatry

## 2022-01-16 ENCOUNTER — Ambulatory Visit: Payer: 59 | Admitting: Podiatry

## 2022-01-16 ENCOUNTER — Other Ambulatory Visit (HOSPITAL_COMMUNITY): Payer: Self-pay

## 2022-01-16 DIAGNOSIS — B351 Tinea unguium: Secondary | ICD-10-CM | POA: Diagnosis not present

## 2022-01-16 MED ORDER — TERBINAFINE HCL 250 MG PO TABS
250.0000 mg | ORAL_TABLET | Freq: Every day | ORAL | 0 refills | Status: DC
  Filled 2022-01-16: qty 90, 90d supply, fill #0

## 2022-01-18 NOTE — Progress Notes (Signed)
Subjective:   Patient ID: Candace [name], female   DOB: 41 y.o.   MRN: [id number]   HPI Patient presents with discoloration of the left big toenail that also has become thickened and states that she does not like the way it looks and states that it has been this way for a period of time does not remember trauma.  Patient does not smoke likes to be active   Review of Systems  All other systems reviewed and are negative.       Objective:  Physical Exam Vitals and nursing note reviewed.  Constitutional:      Appearance: She is well-developed.  Pulmonary:     Effort: Pulmonary effort is normal.  Musculoskeletal:        General: Normal range of motion.  Skin:    General: Skin is warm.  Neurological:     Mental Status: She is alert.     Neurovascular status found to be intact muscle strength was found to be adequate range of motion within normal limits.  Patient is noted to have a deformed thickened hallux nail left foot that may be trauma or fungus or a combination of the 2 most likely.  Patient has good digital perfusion well-oriented x 3     Assessment:  Mycotic nail infection left hallux nail with also trauma which may have been part of the pathology     Plan:  H&P reviewed condition recommended conservative treatment.  We did discuss a great deal to different treatment options and she wants an aggressive conservative approach and I have recommended a combination of oral therapy along with laser and I educated her on this and she did have liver function studies that were normal.  Will start her on Lamisil 1 pill/day for 90 days and I explained risk and then will follow-up with laser therapy times approximate 3-4 treatments and will be seen back as needed with all questions answered today

## 2022-01-19 ENCOUNTER — Other Ambulatory Visit (HOSPITAL_COMMUNITY): Payer: Self-pay

## 2022-01-29 ENCOUNTER — Telehealth: Payer: Self-pay

## 2022-01-30 ENCOUNTER — Other Ambulatory Visit (HOSPITAL_COMMUNITY): Payer: Self-pay

## 2022-01-30 ENCOUNTER — Other Ambulatory Visit: Payer: Self-pay | Admitting: Podiatry

## 2022-01-30 MED ORDER — FLUCONAZOLE 150 MG PO TABS
150.0000 mg | ORAL_TABLET | ORAL | 0 refills | Status: DC
  Filled 2022-01-30: qty 10, 70d supply, fill #0

## 2022-01-30 NOTE — Telephone Encounter (Signed)
I put her on different antifungal that she takes one per week

## 2022-01-30 NOTE — Telephone Encounter (Signed)
Patient is aware and is requesting a referral for Dermatology for hair loss from the medication.

## 2022-02-04 NOTE — Telephone Encounter (Signed)
We don't have any good connections for dermatology. Should contact family physician

## 2022-02-05 ENCOUNTER — Ambulatory Visit (INDEPENDENT_AMBULATORY_CARE_PROVIDER_SITE_OTHER): Payer: Commercial Managed Care - PPO | Admitting: Podiatry

## 2022-02-05 DIAGNOSIS — B351 Tinea unguium: Secondary | ICD-10-CM

## 2022-02-05 NOTE — Patient Instructions (Signed)

## 2022-02-05 NOTE — Progress Notes (Signed)
Patient presents today for the 1st laser treatment. Diagnosed with dermatophytosis of nail by Dr. Paulla Dolly.    Toenail most affected 1st left.   All other systems are negative.   Nails were filed thin. Laser therapy was administered to bilateral 4th and 5th toe and left hallux and patient tolerated the treatment well. All safety precautions were in place.      Follow up in 4 weeks for laser # 2.

## 2022-02-06 ENCOUNTER — Other Ambulatory Visit: Payer: Commercial Managed Care - PPO

## 2022-02-11 ENCOUNTER — Other Ambulatory Visit: Payer: Self-pay

## 2022-02-11 DIAGNOSIS — T50905A Adverse effect of unspecified drugs, medicaments and biological substances, initial encounter: Secondary | ICD-10-CM

## 2022-02-11 NOTE — Telephone Encounter (Signed)
Referral was sent to [facility] 

## 2022-02-15 ENCOUNTER — Encounter: Payer: Self-pay | Admitting: Internal Medicine

## 2022-02-15 ENCOUNTER — Other Ambulatory Visit: Payer: Self-pay | Admitting: Internal Medicine

## 2022-02-16 ENCOUNTER — Other Ambulatory Visit: Payer: Self-pay

## 2022-02-16 ENCOUNTER — Other Ambulatory Visit (HOSPITAL_COMMUNITY): Payer: Self-pay

## 2022-02-16 MED ORDER — LISDEXAMFETAMINE DIMESYLATE 40 MG PO CAPS
40.0000 mg | ORAL_CAPSULE | ORAL | 0 refills | Status: DC
  Filled 2022-02-16 – 2022-03-06 (×2): qty 30, 30d supply, fill #0

## 2022-02-16 MED ORDER — SEMAGLUTIDE-WEIGHT MANAGEMENT 0.25 MG/0.5ML ~~LOC~~ SOAJ
0.2500 mg | SUBCUTANEOUS | 0 refills | Status: AC
  Filled 2022-02-16 – 2022-03-19 (×3): qty 2, 28d supply, fill #0

## 2022-02-20 ENCOUNTER — Ambulatory Visit: Payer: 59 | Admitting: Obstetrics and Gynecology

## 2022-02-20 NOTE — Progress Notes (Deleted)
[facility] Return Visit  SUBJECTIVE  History of Present Illness: Candace [name] is a 41 y.o. female seen in follow-up for OAB. Plan at last visit was start Myrbetriq 52m as she had some improvement with Myrbetriq 264m     Past Medical History: Patient  has a past medical history of Anxiety, Depression, and Hypertension.   Past Surgical History: She  has a past surgical history that includes Dilation and curettage of uterus; Dilation and evacuation (N/A, 02/10/2013); and Wisdom tooth extraction (03/05/2014).   Medications: She has a current medication list which includes the following prescription(s): fluconazole, lisdexamfetamine, minocycline, mirabegron er, nebivolol, semaglutide-weight management, spironolactone, arazlo, terbinafine, and triamcinolone cream.   Allergies: Patient is allergic to amlodipine.   Social History: Patient  reports that she has never smoked. She has never used smokeless tobacco. She reports that she does not currently use alcohol after a past usage of about 3.0 standard drinks of alcohol per week. She reports that she does not use drugs.      OBJECTIVE     Physical Exam: There were no vitals filed for this visit. Gen: No apparent distress, A&O x 3.  Detailed Urogynecologic Evaluation:  Deferred. Prior exam showed:      No data to display             ASSESSMENT AND PLAN    Candace [name] a [age].o. with:  No diagnosis found.

## 2022-02-23 ENCOUNTER — Ambulatory Visit: Payer: 59 | Admitting: Obstetrics and Gynecology

## 2022-02-25 ENCOUNTER — Other Ambulatory Visit (HOSPITAL_COMMUNITY): Payer: Self-pay

## 2022-03-05 ENCOUNTER — Other Ambulatory Visit: Payer: Self-pay

## 2022-03-06 ENCOUNTER — Other Ambulatory Visit (HOSPITAL_COMMUNITY): Payer: Self-pay

## 2022-03-17 ENCOUNTER — Encounter: Payer: Self-pay | Admitting: Internal Medicine

## 2022-03-18 NOTE — Patient Instructions (Addendum)
      Blood work was ordered.   The lab is on the first floor.    Medications changes include :   none    A referral was ordered for XXX.     Someone will call you to schedule an appointment.    Return in about 6 months (around 09/17/2022) for Physical Exam.

## 2022-03-18 NOTE — Progress Notes (Unsigned)
Subjective:    Patient ID: Candace [name], female    DOB: 24-Jun-1980, 42 y.o.   MRN: [id number]     HPI Candace [name] is here for follow up of her chronic medical problems, including htn, prediabetes, obesity, ADD, OSA, vit d def   She is not exercising.   She has had her eggs retrieved and is thinking about having them implanted in the fall.    Still waiting for wegovy to be available.   Medications and allergies reviewed with patient and updated if appropriate.  Current Outpatient Medications on File Prior to Visit  Medication Sig Dispense Refill   fluconazole (DIFLUCAN) 150 MG tablet Take 1 tablet (150 mg total) by mouth once a week for 10 weeks. 10 tablet 0   lisdexamfetamine (VYVANSE) 40 MG capsule Take 1 capsule (40 mg total) by mouth every morning. 30 capsule 0   minocycline (MINOCIN) 100 MG capsule Take 1 capsule by mouth 2 times daily. 60 capsule 1   mirabegron ER (MYRBETRIQ) 50 MG TB24 tablet Take 1 tablet (50 mg total) by mouth daily. 30 tablet 5   nebivolol (BYSTOLIC) 5 MG tablet Take 1 tablet (5 mg total) by mouth daily. 90 tablet 2   spironolactone (ALDACTONE) 50 MG tablet Take one tablet by mouth for 2 week then increase to two tablets as tolerated 60 tablet 2   Tazarotene (ARAZLO) 0.045 % LOTN Apply a pea-sized amount at bedtime 45 g 4   terbinafine (LAMISIL) 250 MG tablet Take 1 tablet (250 mg total) by mouth daily. 90 tablet 0   triamcinolone cream (KENALOG) 0.1 % APPLY TOPICALLY TWICE A DAY AS NEEDED. NOT FOR MORE THAN 2 WEEKS AT A TIME. 30 g 5   No current facility-administered medications on file prior to visit.     Review of Systems  Constitutional:  Negative for fever.  Respiratory:  Negative for cough, shortness of breath and wheezing.   Cardiovascular:  Positive for palpitations (anxiety related). Negative for chest pain and leg swelling.  Neurological:  Negative for light-headedness and headaches.       Objective:   Vitals:   03/19/22 1032  BP:  124/76  Pulse: 80  Temp: 98.8 F (37.1 C)  SpO2: 98%   BP Readings from Last 3 Encounters:  03/19/22 124/76  11/18/21 (!) 144/96  10/28/21 124/82   Wt Readings from Last 3 Encounters:  03/19/22 185 lb (83.9 kg)  11/18/21 188 lb (85.3 kg)  10/28/21 188 lb (85.3 kg)   Body mass index is 31.76 kg/m.    Physical Exam Constitutional:      General: She is not in acute distress.    Appearance: Normal appearance.  HENT:     Head: Normocephalic and atraumatic.  Eyes:     Conjunctiva/sclera: Conjunctivae normal.  Cardiovascular:     Rate and Rhythm: Normal rate and regular rhythm.     Heart sounds: Normal heart sounds. No murmur heard. Pulmonary:     Effort: Pulmonary effort is normal. No respiratory distress.     Breath sounds: Normal breath sounds. No wheezing.  Musculoskeletal:     Cervical back: Neck supple.     Right lower leg: No edema.     Left lower leg: No edema.  Lymphadenopathy:     Cervical: No cervical adenopathy.  Skin:    General: Skin is warm and dry.     Findings: No rash.  Neurological:     Mental Status: She is alert. Mental  status is at baseline.  Psychiatric:        Mood and Affect: Mood normal.        Behavior: Behavior normal.        Lab Results  Component Value Date   WBC 4.0 10/28/2021   HGB 11.8 (L) 10/28/2021   HCT 35.0 (L) 10/28/2021   PLT 177.0 10/28/2021   GLUCOSE 93 10/28/2021   CHOL 178 10/28/2021   TRIG 81.0 10/28/2021   HDL 48.50 10/28/2021   LDLCALC 114 (H) 10/28/2021   ALT 11 10/28/2021   AST 13 10/28/2021   NA 138 10/28/2021   K 3.6 10/28/2021   CL 105 10/28/2021   CREATININE 0.97 10/28/2021   BUN 26 (H) 10/28/2021   CO2 25 10/28/2021   TSH 2.24 10/28/2021   HGBA1C 5.9 10/28/2021     Assessment & Plan:    See Problem List for Assessment and Plan of chronic medical problems.

## 2022-03-19 ENCOUNTER — Other Ambulatory Visit (HOSPITAL_COMMUNITY): Payer: Self-pay

## 2022-03-19 ENCOUNTER — Encounter: Payer: Self-pay | Admitting: Internal Medicine

## 2022-03-19 ENCOUNTER — Ambulatory Visit (INDEPENDENT_AMBULATORY_CARE_PROVIDER_SITE_OTHER): Payer: Commercial Managed Care - PPO | Admitting: Internal Medicine

## 2022-03-19 ENCOUNTER — Other Ambulatory Visit: Payer: Self-pay

## 2022-03-19 VITALS — BP 124/76 | HR 80 | Temp 98.8°F | Ht 64.0 in | Wt 185.0 lb

## 2022-03-19 DIAGNOSIS — I10 Essential (primary) hypertension: Secondary | ICD-10-CM

## 2022-03-19 DIAGNOSIS — F988 Other specified behavioral and emotional disorders with onset usually occurring in childhood and adolescence: Secondary | ICD-10-CM | POA: Diagnosis not present

## 2022-03-19 DIAGNOSIS — Z6831 Body mass index (BMI) 31.0-31.9, adult: Secondary | ICD-10-CM

## 2022-03-19 DIAGNOSIS — L700 Acne vulgaris: Secondary | ICD-10-CM

## 2022-03-19 DIAGNOSIS — E559 Vitamin D deficiency, unspecified: Secondary | ICD-10-CM | POA: Diagnosis not present

## 2022-03-19 DIAGNOSIS — R7303 Prediabetes: Secondary | ICD-10-CM | POA: Diagnosis not present

## 2022-03-19 DIAGNOSIS — L709 Acne, unspecified: Secondary | ICD-10-CM | POA: Insufficient documentation

## 2022-03-19 DIAGNOSIS — E6609 Other obesity due to excess calories: Secondary | ICD-10-CM

## 2022-03-19 LAB — COMPREHENSIVE METABOLIC PANEL
ALT: 10 U/L (ref 0–35)
AST: 13 U/L (ref 0–37)
Albumin: 4 g/dL (ref 3.5–5.2)
Alkaline Phosphatase: 39 U/L (ref 39–117)
BUN: 19 mg/dL (ref 6–23)
CO2: 27 mEq/L (ref 19–32)
Calcium: 9 mg/dL (ref 8.4–10.5)
Chloride: 106 mEq/L (ref 96–112)
Creatinine, Ser: 0.72 mg/dL (ref 0.40–1.20)
GFR: 103.87 mL/min (ref >60.00)
Glucose, Bld: 91 mg/dL (ref 70–99)
Potassium: 3.6 mEq/L (ref 3.5–5.1)
Sodium: 139 mEq/L (ref 135–145)
Total Bilirubin: 0.4 mg/dL (ref 0.2–1.2)
Total Protein: 7.2 g/dL (ref 6.0–8.3)

## 2022-03-19 LAB — HEMOGLOBIN A1C: Hgb A1c MFr Bld: 5.8 % (ref 4.6–6.5)

## 2022-03-19 LAB — VITAMIN D 25 HYDROXY (VIT D DEFICIENCY, FRACTURES): VITD: 19.14 ng/mL — ABNORMAL LOW (ref 30.00–100.00)

## 2022-03-19 MED ORDER — WEGOVY 0.5 MG/0.5ML ~~LOC~~ SOAJ
0.5000 mg | SUBCUTANEOUS | 0 refills | Status: DC
  Filled 2022-03-19: qty 2, 28d supply, fill #0

## 2022-03-19 NOTE — Assessment & Plan Note (Signed)
Chronic Taking vitamin d daily Check vitamin d level  

## 2022-03-19 NOTE — Assessment & Plan Note (Addendum)
Chronic BP well controlled holding bystolic 5 mg - ok to hold since BP is good On spironolactone for acne - 50 mg daily, which is controlling her BP cmp

## 2022-03-19 NOTE — Assessment & Plan Note (Signed)
Chronic Check a1c Low sugar / carb diet Stressed regular exercise  

## 2022-03-19 NOTE — Assessment & Plan Note (Signed)
Chronic Controlled, stable Continue vyvanse 40 mg daily

## 2022-03-19 NOTE — Assessment & Plan Note (Signed)
Chronic Has been approved for wegovy - has not been able to get it -- she did find out the lowest dose is available and will pick that up today Starting 0.25 mg weekly now Stressed regular exercise and healthy diet -decreased portions - diet high in protein, fiber, veges Stressed that lifestyle changes are the only thing that really work long term

## 2022-03-19 NOTE — Assessment & Plan Note (Signed)
Chronic On spironolactone

## 2022-03-20 ENCOUNTER — Ambulatory Visit (INDEPENDENT_AMBULATORY_CARE_PROVIDER_SITE_OTHER): Payer: Commercial Managed Care - PPO | Admitting: *Deleted

## 2022-03-20 DIAGNOSIS — B351 Tinea unguium: Secondary | ICD-10-CM

## 2022-03-20 NOTE — Progress Notes (Signed)
Patient presents today for the 2nd laser treatment. Diagnosed with dermatophytosis of nail by Dr. Paulla Dolly.    Toenail most affected 1st left. She feels like the nails have already lightened up a little bit.   All other systems are negative.   Nails were filed thin. Laser therapy was administered to the hallux left and 4th and 5th bilateral and patient tolerated the treatment well. All safety precautions were in place.      Follow up in 4 weeks for laser # 3.

## 2022-03-23 ENCOUNTER — Encounter: Payer: Self-pay | Admitting: *Deleted

## 2022-03-26 ENCOUNTER — Ambulatory Visit: Payer: Self-pay | Admitting: Podiatry

## 2022-04-02 ENCOUNTER — Other Ambulatory Visit (HOSPITAL_COMMUNITY): Payer: Self-pay

## 2022-04-02 ENCOUNTER — Other Ambulatory Visit: Payer: Self-pay | Admitting: Internal Medicine

## 2022-04-02 MED ORDER — LISDEXAMFETAMINE DIMESYLATE 40 MG PO CAPS
40.0000 mg | ORAL_CAPSULE | ORAL | 0 refills | Status: DC
  Filled 2022-04-02: qty 30, 30d supply, fill #0

## 2022-04-03 ENCOUNTER — Other Ambulatory Visit (HOSPITAL_COMMUNITY): Payer: Self-pay

## 2022-04-04 ENCOUNTER — Other Ambulatory Visit (HOSPITAL_COMMUNITY): Payer: Self-pay

## 2022-04-23 ENCOUNTER — Other Ambulatory Visit: Payer: Self-pay | Admitting: Internal Medicine

## 2022-04-24 ENCOUNTER — Other Ambulatory Visit (HOSPITAL_COMMUNITY): Payer: Self-pay

## 2022-04-24 ENCOUNTER — Other Ambulatory Visit: Payer: Self-pay

## 2022-04-24 ENCOUNTER — Ambulatory Visit (INDEPENDENT_AMBULATORY_CARE_PROVIDER_SITE_OTHER): Payer: Commercial Managed Care - PPO | Admitting: Podiatry

## 2022-04-24 DIAGNOSIS — B351 Tinea unguium: Secondary | ICD-10-CM

## 2022-04-24 MED ORDER — WEGOVY 0.5 MG/0.5ML ~~LOC~~ SOAJ
0.5000 mg | SUBCUTANEOUS | 0 refills | Status: DC
  Filled 2022-04-24: qty 2, 28d supply, fill #0

## 2022-04-24 NOTE — Progress Notes (Unsigned)
Patient presents today for the 3rd laser treatment. Diagnosed with dermatophytosis of nail by Dr. Paulla Dolly.    Toenail most affected 1st left. She feels like the nails are looking good.   All other systems are negative.   Nails were filed thin. Laser therapy was administered to the hallux left and 4th and 5th bilateral and patient tolerated the treatment well. All safety precautions were in place.      Patient has completed the recommended laser treatments. He will follow up with Dr. Paulla Dolly in 3 months to evaluate progress.    Advised to watch the left hallux and if she feels she needs more lasers to let us know.

## 2022-05-01 ENCOUNTER — Ambulatory Visit: Payer: 59 | Admitting: Internal Medicine

## 2022-05-01 ENCOUNTER — Other Ambulatory Visit (HOSPITAL_COMMUNITY): Payer: Self-pay

## 2022-05-20 ENCOUNTER — Other Ambulatory Visit: Payer: Self-pay

## 2022-05-20 ENCOUNTER — Other Ambulatory Visit: Payer: Self-pay | Admitting: Internal Medicine

## 2022-05-20 ENCOUNTER — Other Ambulatory Visit (HOSPITAL_COMMUNITY): Payer: Self-pay

## 2022-05-20 MED ORDER — LISDEXAMFETAMINE DIMESYLATE 40 MG PO CAPS
40.0000 mg | ORAL_CAPSULE | ORAL | 0 refills | Status: DC
  Filled 2022-05-20: qty 30, 30d supply, fill #0

## 2022-05-20 MED ORDER — WEGOVY 0.5 MG/0.5ML ~~LOC~~ SOAJ
0.5000 mg | SUBCUTANEOUS | 0 refills | Status: DC
  Filled 2022-05-20 – 2022-06-04 (×2): qty 2, 28d supply, fill #0

## 2022-05-21 ENCOUNTER — Other Ambulatory Visit (HOSPITAL_COMMUNITY): Payer: Self-pay

## 2022-05-22 ENCOUNTER — Other Ambulatory Visit (HOSPITAL_COMMUNITY): Payer: Self-pay

## 2022-05-25 ENCOUNTER — Other Ambulatory Visit (HOSPITAL_COMMUNITY): Payer: Self-pay

## 2022-05-27 ENCOUNTER — Other Ambulatory Visit (HOSPITAL_COMMUNITY): Payer: Self-pay

## 2022-05-27 MED ORDER — METRONIDAZOLE 0.75 % VA GEL
VAGINAL | 0 refills | Status: AC
  Filled 2022-05-27 – 2022-06-04 (×2): qty 70, 5d supply, fill #0

## 2022-05-28 ENCOUNTER — Other Ambulatory Visit (HOSPITAL_COMMUNITY): Payer: Self-pay

## 2022-06-04 ENCOUNTER — Other Ambulatory Visit (HOSPITAL_COMMUNITY): Payer: Self-pay

## 2022-06-04 ENCOUNTER — Encounter (INDEPENDENT_AMBULATORY_CARE_PROVIDER_SITE_OTHER): Payer: Commercial Managed Care - PPO | Admitting: Internal Medicine

## 2022-06-04 DIAGNOSIS — E6609 Other obesity due to excess calories: Secondary | ICD-10-CM

## 2022-06-08 ENCOUNTER — Other Ambulatory Visit (HOSPITAL_COMMUNITY): Payer: Self-pay

## 2022-06-08 DIAGNOSIS — E6609 Other obesity due to excess calories: Secondary | ICD-10-CM | POA: Diagnosis not present

## 2022-06-08 DIAGNOSIS — Z6831 Body mass index (BMI) 31.0-31.9, adult: Secondary | ICD-10-CM

## 2022-06-08 MED ORDER — NALTREXONE-BUPROPION HCL ER 8-90 MG PO TB12
ORAL_TABLET | ORAL | 0 refills | Status: DC
  Filled 2022-06-08: qty 70, 28d supply, fill #0
  Filled 2022-07-08: qty 70, 30d supply, fill #0

## 2022-06-08 NOTE — Telephone Encounter (Signed)

## 2022-06-19 ENCOUNTER — Other Ambulatory Visit (HOSPITAL_COMMUNITY): Payer: Self-pay

## 2022-06-22 ENCOUNTER — Telehealth: Payer: Self-pay

## 2022-06-22 NOTE — Telephone Encounter (Signed)
Per pharmacy Contrave requires PA.  PA started via [facility] Key: [id number]

## 2022-06-25 ENCOUNTER — Other Ambulatory Visit (HOSPITAL_COMMUNITY): Payer: Self-pay

## 2022-06-26 ENCOUNTER — Other Ambulatory Visit (HOSPITAL_COMMUNITY): Payer: Self-pay

## 2022-07-01 ENCOUNTER — Other Ambulatory Visit: Payer: Self-pay | Admitting: Internal Medicine

## 2022-07-01 MED ORDER — LISDEXAMFETAMINE DIMESYLATE 40 MG PO CAPS
40.0000 mg | ORAL_CAPSULE | ORAL | 0 refills | Status: DC
  Filled 2022-07-01 – 2022-08-04 (×2): qty 30, 30d supply, fill #0

## 2022-07-02 ENCOUNTER — Other Ambulatory Visit (HOSPITAL_COMMUNITY): Payer: Self-pay

## 2022-07-03 ENCOUNTER — Other Ambulatory Visit (HOSPITAL_COMMUNITY): Payer: Self-pay

## 2022-07-04 ENCOUNTER — Other Ambulatory Visit (HOSPITAL_COMMUNITY): Payer: Self-pay

## 2022-07-08 ENCOUNTER — Other Ambulatory Visit (HOSPITAL_COMMUNITY): Payer: Self-pay

## 2022-07-09 ENCOUNTER — Other Ambulatory Visit (HOSPITAL_COMMUNITY): Payer: Self-pay

## 2022-07-10 ENCOUNTER — Other Ambulatory Visit (HOSPITAL_COMMUNITY): Payer: Self-pay

## 2022-07-20 ENCOUNTER — Other Ambulatory Visit (HOSPITAL_COMMUNITY): Payer: Self-pay

## 2022-07-23 ENCOUNTER — Other Ambulatory Visit (HOSPITAL_COMMUNITY): Payer: Self-pay

## 2022-07-23 ENCOUNTER — Other Ambulatory Visit: Payer: Self-pay

## 2022-07-23 ENCOUNTER — Ambulatory Visit (INDEPENDENT_AMBULATORY_CARE_PROVIDER_SITE_OTHER): Payer: Commercial Managed Care - PPO | Admitting: Dermatology

## 2022-07-23 VITALS — BP 132/90

## 2022-07-23 DIAGNOSIS — L304 Erythema intertrigo: Secondary | ICD-10-CM

## 2022-07-23 DIAGNOSIS — L700 Acne vulgaris: Secondary | ICD-10-CM

## 2022-07-23 DIAGNOSIS — L83 Acanthosis nigricans: Secondary | ICD-10-CM

## 2022-07-23 MED ORDER — HYDROQUINONE 4 % EX CREA
TOPICAL_CREAM | CUTANEOUS | 2 refills | Status: AC
  Filled 2022-07-23 – 2022-08-04 (×2): qty 28.35, 30d supply, fill #0

## 2022-07-23 MED ORDER — ARAZLO 0.045 % EX LOTN
1.0000 "application " | TOPICAL_LOTION | Freq: Every day | CUTANEOUS | 2 refills | Status: DC
  Filled 2022-07-23: qty 45, 30d supply, fill #0

## 2022-07-23 MED ORDER — MINOCYCLINE HCL 100 MG PO CAPS
100.0000 mg | ORAL_CAPSULE | Freq: Every day | ORAL | 2 refills | Status: DC
  Filled 2022-07-23 – 2022-08-04 (×2): qty 30, 30d supply, fill #0

## 2022-07-23 MED ORDER — SPIRONOLACTONE 50 MG PO TABS
50.0000 mg | ORAL_TABLET | Freq: Every day | ORAL | 2 refills | Status: DC
  Filled 2022-07-23 – 2022-08-04 (×2): qty 30, 30d supply, fill #0

## 2022-07-23 MED ORDER — NYSTATIN-TRIAMCINOLONE 100000-0.1 UNIT/GM-% EX OINT
1.0000 | TOPICAL_OINTMENT | Freq: Two times a day (BID) | CUTANEOUS | 0 refills | Status: DC
  Filled 2022-07-23 – 2022-08-04 (×2): qty 30, 15d supply, fill #0

## 2022-07-23 NOTE — Patient Instructions (Addendum)
Due to recent changes in healthcare laws, you may see results of your pathology and/or laboratory studies on MyChart before the doctors have had a chance to review them. We understand that in some cases there may be results that are confusing or concerning to you. Please understand that not all results are received at the same time and often the doctors may need to interpret multiple results in order to provide you with the best plan of care or course of treatment. Therefore, we ask that you please give us 2 business days to thoroughly review all your results before contacting the office for clarification. Should we see a critical lab result, you will be contacted sooner.   If You Need Anything After Your Visit  If you have any questions or concerns for your doctor, please call our main line at [phone] If no one answers, please leave a voicemail as directed and we will return your call as soon as possible. Messages left after 4 pm will be answered the following business day.   You may also send us a message via MyChart. We typically respond to MyChart messages within 1-2 business days.  For prescription refills, please ask your pharmacy to contact our office. Our fax number is [phone].  If you have an urgent issue when the clinic is closed that cannot wait until the next business day, you can page your doctor at the number below.    Please note that while we do our best to be available for urgent issues outside of office hours, we are not available 24/7.   If you have an urgent issue and are unable to reach us, you may choose to seek medical care at your doctor's office, retail clinic, urgent care center, or emergency room.  If you have a medical emergency, please immediately call 911 or go to the emergency department. In the event of inclement weather, please call our main line at [phone] for an update on the status of any delays or closures.  Dermatology Medication Tips: Please  keep the boxes that topical medications come in in order to help keep track of the instructions about where and how to use these. Pharmacies typically print the medication instructions only on the boxes and not directly on the medication tubes.   If your medication is too expensive, please contact our office at [phone] or send us a message through MyChart.   We are unable to tell what your co-pay for medications will be in advance as this is different depending on your insurance coverage. However, we may be able to find a substitute medication at lower cost or fill out paperwork to get insurance to cover a needed medication.   If a prior authorization is required to get your medication covered by your insurance company, please allow us 1-2 business days to complete this process.  Drug prices often vary depending on where the prescription is filled and some pharmacies may offer cheaper prices.  The website www.goodrx.com contains coupons for medications through different pharmacies. The prices here do not account for what the cost may be with help from insurance (it may be cheaper with your insurance), but the website can give you the price if you did not use any insurance.  - You can print the associated coupon and take it with your prescription to the pharmacy.  - You may also stop by our office during regular business hours and pick up a GoodRx coupon card.  - If you need your   prescription sent electronically to a different pharmacy, notify our office through [facility] MyChart or by phone at [phone]     

## 2022-07-23 NOTE — Progress Notes (Signed)
   New Patient Visit   Subjective  Candace [name] is a 42 y.o. female who presents for the following: Acne of face chest and back - Treating with Arazlo, Spironolactone and Minocycline only when needed. She has deeper bumps on her back that may need to be extracted. She also has dark patches under her arms and under her breasts.   The following portions of the chart were reviewed this encounter and updated as appropriate: medications, allergies, medical history  Review of Systems:  No other skin or systemic complaints except as noted in HPI or Assessment and Plan.  Objective  Well appearing patient in no apparent distress; mood and affect are within normal limits.   A focused examination was performed of the following areas: Face  Relevant exam findings are noted in the Assessment and Plan.         Assessment & Plan   ACNE VULGARIS Exam:     Treatment Plan:  Increase Arazlo lotion to 3 nights per week, Spironolactone 50 mg 1 tablet daily - advised patient consistency with these medication can help prevent flares.  Continue Minocycline 100 mg 1 tablet daily with food and plenty of fluid prn flares.   Medication Counseling Retinoid Counseling: Patient advised to apply a pea-sized amount only at bedtime and wait 30 minutes after washing their face before applying. If too drying, patient may add a non-comedogenic moisturizer. The patient verbalized understanding of the proper use and possible adverse effects of retinoids. All of the patient's questions and concerns were addressed.   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 She is currently being treated for slightly high A1C. She started Contrave about a week ago.  Recommend Glyoclic Acid every other night at bedtime    INTERTRIGO of inframammary areas  Intertrigo is a chronic recurrent rash  that occurs in skin fold areas that may be associated with friction; heat; moisture; yeast; fungus; and bacteria.  It is exacerbated by increased movement / activity; sweating; and higher atmospheric temperature.  Treatment Plan Nystatin-Triamcinolone cream bid x 1 week   Hyperpigmentation Exam: Hyperpigmented patches of axillay and inframammary areas  Treatment Plan: Start Hydroquinone 4% cream every other night to underarms and every night to inframammary areas x 3 months.   Dilated Pores of upper back  Treatment Plan: Apply Arazlo lotion nightly.      Return in about 3 months (around 10/23/2022).  I, Joanie Coddington, CMA, am acting as scribe for [employer], DO .   Documentation: I have reviewed the above documentation for accuracy and completeness, and I agree with the above.  Langston Reusing, DO

## 2022-07-27 ENCOUNTER — Other Ambulatory Visit (HOSPITAL_COMMUNITY): Payer: Self-pay

## 2022-07-27 ENCOUNTER — Other Ambulatory Visit: Payer: Self-pay

## 2022-07-27 DIAGNOSIS — L700 Acne vulgaris: Secondary | ICD-10-CM

## 2022-07-27 MED ORDER — ARAZLO 0.045 % EX LOTN
1.0000 | TOPICAL_LOTION | Freq: Every day | CUTANEOUS | 2 refills | Status: DC
Start: 2022-07-27 — End: 2023-03-30

## 2022-07-30 ENCOUNTER — Other Ambulatory Visit (HOSPITAL_COMMUNITY): Payer: Self-pay

## 2022-08-03 ENCOUNTER — Encounter: Payer: Self-pay | Admitting: Dermatology

## 2022-08-04 ENCOUNTER — Other Ambulatory Visit: Payer: Self-pay

## 2022-08-04 ENCOUNTER — Other Ambulatory Visit (HOSPITAL_COMMUNITY): Payer: Self-pay

## 2022-08-05 ENCOUNTER — Other Ambulatory Visit (HOSPITAL_COMMUNITY): Payer: Self-pay

## 2022-08-18 ENCOUNTER — Other Ambulatory Visit: Payer: Self-pay | Admitting: Internal Medicine

## 2022-08-18 ENCOUNTER — Encounter: Payer: Self-pay | Admitting: Internal Medicine

## 2022-08-18 ENCOUNTER — Other Ambulatory Visit (HOSPITAL_COMMUNITY): Payer: Self-pay

## 2022-08-18 MED ORDER — CONTRAVE 8-90 MG PO TB12
2.0000 | ORAL_TABLET | Freq: Two times a day (BID) | ORAL | 2 refills | Status: AC
  Filled 2022-08-18: qty 120, 30d supply, fill #0

## 2022-08-31 ENCOUNTER — Other Ambulatory Visit (HOSPITAL_COMMUNITY): Payer: Self-pay

## 2022-08-31 DIAGNOSIS — N979 Female infertility, unspecified: Secondary | ICD-10-CM | POA: Diagnosis not present

## 2022-08-31 DIAGNOSIS — E669 Obesity, unspecified: Secondary | ICD-10-CM | POA: Diagnosis not present

## 2022-08-31 DIAGNOSIS — E559 Vitamin D deficiency, unspecified: Secondary | ICD-10-CM | POA: Diagnosis not present

## 2022-08-31 DIAGNOSIS — N970 Female infertility associated with anovulation: Secondary | ICD-10-CM | POA: Diagnosis not present

## 2022-08-31 DIAGNOSIS — Z3184 Encounter for fertility preservation procedure: Secondary | ICD-10-CM | POA: Diagnosis not present

## 2022-08-31 DIAGNOSIS — Z31430 Encounter of female for testing for genetic disease carrier status for procreative management: Secondary | ICD-10-CM | POA: Diagnosis not present

## 2022-08-31 DIAGNOSIS — E282 Polycystic ovarian syndrome: Secondary | ICD-10-CM | POA: Diagnosis not present

## 2022-08-31 DIAGNOSIS — Z3183 Encounter for assisted reproductive fertility procedure cycle: Secondary | ICD-10-CM | POA: Diagnosis not present

## 2022-08-31 MED ORDER — METFORMIN HCL 1000 MG PO TABS
ORAL_TABLET | ORAL | 6 refills | Status: DC
  Filled 2022-08-31: qty 60, 41d supply, fill #0
  Filled 2022-09-12: qty 60, 30d supply, fill #0

## 2022-09-02 ENCOUNTER — Encounter (INDEPENDENT_AMBULATORY_CARE_PROVIDER_SITE_OTHER): Payer: Self-pay

## 2022-09-08 ENCOUNTER — Other Ambulatory Visit (HOSPITAL_COMMUNITY): Payer: Self-pay

## 2022-09-09 ENCOUNTER — Other Ambulatory Visit (HOSPITAL_COMMUNITY): Payer: Self-pay

## 2022-09-09 MED ORDER — VITAMIN D3 1.25 MG (50000 UT) PO CAPS
50000.0000 [IU] | ORAL_CAPSULE | ORAL | 2 refills | Status: DC
  Filled 2022-09-09: qty 12, 84d supply, fill #0

## 2022-09-12 ENCOUNTER — Other Ambulatory Visit (HOSPITAL_COMMUNITY): Payer: Self-pay

## 2022-09-17 NOTE — Patient Instructions (Addendum)
     Monitor your BP - less than 130/80 is the goal - want your BP to be consistent    Medications changes include :   None     Return in about 6 months (around 03/21/2023) for Physical Exam.

## 2022-09-17 NOTE — Progress Notes (Signed)
Subjective:    Patient ID: Candace [name], female    DOB: September 16, 1980, 42 y.o.   MRN: [id number]     HPI Candace [name] is here for follow up of her chronic medical problems.  Has been on contrave x 2 months - has not seen a difference in appetite, has not changed cravings  Medications and allergies reviewed with patient and updated if appropriate.  Current Outpatient Medications on File Prior to Visit  Medication Sig Dispense Refill   Cholecalciferol (VITAMIN D3) 1.25 MG (50000 UT) CAPS Take 1 capsule (50,000 Units total) by mouth once a week. 12 capsule 2   fluconazole (DIFLUCAN) 150 MG tablet Take 1 tablet (150 mg total) by mouth once a week for 10 weeks. 10 tablet 0   gabapentin (NEURONTIN) 100 MG capsule Take by mouth.     hydroquinone 4 % cream Apply topically as directed. Apply every night under breast and every other night to underarms x 3 months 28.35 g 2   lisdexamfetamine (VYVANSE) 40 MG capsule Take 1 capsule (40 mg) by mouth every morning. 30 capsule 0   metFORMIN (GLUCOPHAGE) 1000 MG tablet Take 1/2 tab once daily for 1 week, 1/2 tab twice daily for 1 week, 1 tab every morning and 1/2 tab at bedtime for 1 week , then 1 tab twice daily. 60 tablet 6   metroNIDAZOLE (METROGEL) 0.75 % vaginal gel Insert 1 applicatorful vaginally daily for 5 days. 70 g 0   minocycline (MINOCIN) 100 MG capsule Take 1 capsule by mouth 2 times daily. 60 capsule 1   minocycline (MINOCIN) 100 MG capsule Take 1 capsule (100 mg) by mouth daily. Take with food and plenty of fluid 30 capsule 2   mirabegron ER (MYRBETRIQ) 50 MG TB24 tablet Take 1 tablet (50 mg total) by mouth daily. 30 tablet 5   Naltrexone-buPROPion HCl ER (CONTRAVE) 8-90 MG TB12 Take 2 tablets by mouth in the morning and at bedtime. 120 tablet 2   nebivolol (BYSTOLIC) 5 MG tablet Take 1 tablet (5 mg total) by mouth daily. 90 tablet 2   nystatin-triamcinolone ointment (MYCOLOG) Apply topically 2 times daily. Apply to affected areas under  breast x 1 week 30 g 0   spironolactone (ALDACTONE) 50 MG tablet Take 1 tablet by mouth for 2 weeks then increase to 2 tablets as tolerated 60 tablet 2   spironolactone (ALDACTONE) 50 MG tablet Take 1 tablet (50 mg) by mouth daily. 30 tablet 2   Tazarotene (ARAZLO) 0.045 % LOTN Apply 1 application  topically at bedtime. Apply PEA SIZED AMOUNT to face chest and back 45 g 2   No current facility-administered medications on file prior to visit.     Review of Systems  Constitutional:  Negative for fever.  Respiratory:  Negative for cough, shortness of breath and wheezing.   Cardiovascular:  Negative for chest pain, palpitations and leg swelling.  Neurological:  Positive for headaches. Negative for dizziness and light-headedness.       Objective:   Vitals:   09/18/22 0838  BP: 138/82  Pulse: 80  Temp: 98.1 F (36.7 C)  SpO2: 98%   BP Readings from Last 3 Encounters:  09/18/22 138/82  07/23/22 (!) 132/90  03/19/22 124/76   Wt Readings from Last 3 Encounters:  09/18/22 188 lb 3.2 oz (85.4 kg)  03/19/22 185 lb (83.9 kg)  11/18/21 188 lb (85.3 kg)   Body mass index is 32.3 kg/m.    Physical Exam Constitutional:  General: She is not in acute distress.    Appearance: Normal appearance.  HENT:     Head: Normocephalic and atraumatic.  Eyes:     Conjunctiva/sclera: Conjunctivae normal.  Cardiovascular:     Rate and Rhythm: Normal rate and regular rhythm.     Heart sounds: Normal heart sounds.  Pulmonary:     Effort: Pulmonary effort is normal. No respiratory distress.     Breath sounds: Normal breath sounds. No wheezing.  Musculoskeletal:     Cervical back: Neck supple.     Right lower leg: No edema.     Left lower leg: No edema.  Lymphadenopathy:     Cervical: No cervical adenopathy.  Skin:    General: Skin is warm and dry.     Findings: No rash.  Neurological:     Mental Status: She is alert. Mental status is at baseline.  Psychiatric:        Mood and  Affect: Mood normal.        Behavior: Behavior normal.        Lab Results  Component Value Date   WBC 4.0 10/28/2021   HGB 11.8 (L) 10/28/2021   HCT 35.0 (L) 10/28/2021   PLT 177.0 10/28/2021   GLUCOSE 91 03/19/2022   CHOL 178 10/28/2021   TRIG 81.0 10/28/2021   HDL 48.50 10/28/2021   LDLCALC 114 (H) 10/28/2021   ALT 10 03/19/2022   AST 13 03/19/2022   NA 139 03/19/2022   K 3.6 03/19/2022   CL 106 03/19/2022   CREATININE 0.72 03/19/2022   BUN 19 03/19/2022   CO2 27 03/19/2022   TSH 2.24 10/28/2021   HGBA1C 5.8 03/19/2022     Assessment & Plan:    See Problem List for Assessment and Plan of chronic medical problems.

## 2022-09-18 ENCOUNTER — Encounter: Payer: Self-pay | Admitting: Internal Medicine

## 2022-09-18 ENCOUNTER — Ambulatory Visit: Payer: Commercial Managed Care - PPO | Admitting: Internal Medicine

## 2022-09-18 VITALS — BP 138/82 | HR 80 | Temp 98.1°F | Ht 64.0 in | Wt 188.2 lb

## 2022-09-18 DIAGNOSIS — G4733 Obstructive sleep apnea (adult) (pediatric): Secondary | ICD-10-CM | POA: Diagnosis not present

## 2022-09-18 DIAGNOSIS — L700 Acne vulgaris: Secondary | ICD-10-CM | POA: Diagnosis not present

## 2022-09-18 DIAGNOSIS — E6609 Other obesity due to excess calories: Secondary | ICD-10-CM | POA: Diagnosis not present

## 2022-09-18 DIAGNOSIS — I10 Essential (primary) hypertension: Secondary | ICD-10-CM | POA: Diagnosis not present

## 2022-09-18 DIAGNOSIS — F988 Other specified behavioral and emotional disorders with onset usually occurring in childhood and adolescence: Secondary | ICD-10-CM | POA: Diagnosis not present

## 2022-09-18 DIAGNOSIS — Z6831 Body mass index (BMI) 31.0-31.9, adult: Secondary | ICD-10-CM | POA: Diagnosis not present

## 2022-09-18 DIAGNOSIS — E559 Vitamin D deficiency, unspecified: Secondary | ICD-10-CM | POA: Diagnosis not present

## 2022-09-18 DIAGNOSIS — R7303 Prediabetes: Secondary | ICD-10-CM

## 2022-09-18 NOTE — Assessment & Plan Note (Signed)
Chronic Lost weight - not on cpap

## 2022-09-18 NOTE — Assessment & Plan Note (Addendum)
Chronic Taking high dose vitamin d weekly Advised starting 2000 units vit d daily

## 2022-09-18 NOTE — Assessment & Plan Note (Addendum)
Chronic BP borderline  Advised to start monitoring BP at home - goal < 130/80 consistently cmp On spironolactone for acne - 50 mg daily, which is controlling her BP

## 2022-09-18 NOTE — Assessment & Plan Note (Signed)
Chronic Check a1c Low sugar / carb diet Stressed regular exercise  

## 2022-09-18 NOTE — Assessment & Plan Note (Addendum)
Chronic Controlled, stable Continue vyvanse 40 mg daily - takes it only for work

## 2022-09-18 NOTE — Assessment & Plan Note (Signed)
Chronic On spironolactone

## 2022-09-18 NOTE — Assessment & Plan Note (Addendum)
Chronic On  contrave 8-90 mg - 2 tabs bid x 2 months - not working all that well - likely will not continue it and will have to stop it when she does fertility treatments Stressed regular exercise and healthy diet  Stressed that lifestyle changes are the only thing that really work long term

## 2022-10-06 ENCOUNTER — Other Ambulatory Visit: Payer: Self-pay | Admitting: Internal Medicine

## 2022-10-06 ENCOUNTER — Encounter: Payer: Self-pay | Admitting: Internal Medicine

## 2022-10-06 MED ORDER — LISDEXAMFETAMINE DIMESYLATE 40 MG PO CAPS
40.0000 mg | ORAL_CAPSULE | ORAL | 0 refills | Status: DC
Start: 2022-10-06 — End: 2023-01-07
  Filled 2022-10-06 – 2022-11-16 (×2): qty 30, 30d supply, fill #0

## 2022-10-07 ENCOUNTER — Other Ambulatory Visit (HOSPITAL_COMMUNITY): Payer: Self-pay

## 2022-10-12 ENCOUNTER — Other Ambulatory Visit (HOSPITAL_COMMUNITY): Payer: Self-pay

## 2022-10-12 MED ORDER — METRONIDAZOLE 0.75 % VA GEL
VAGINAL | 0 refills | Status: DC
  Filled 2022-10-12: qty 70, 5d supply, fill #0

## 2022-10-15 ENCOUNTER — Other Ambulatory Visit (HOSPITAL_COMMUNITY): Payer: Self-pay

## 2022-10-16 ENCOUNTER — Other Ambulatory Visit (HOSPITAL_COMMUNITY): Payer: Self-pay

## 2022-10-22 ENCOUNTER — Ambulatory Visit: Payer: Commercial Managed Care - PPO | Admitting: Dermatology

## 2022-11-16 ENCOUNTER — Other Ambulatory Visit (HOSPITAL_COMMUNITY): Payer: Self-pay

## 2022-11-25 DIAGNOSIS — Z6833 Body mass index (BMI) 33.0-33.9, adult: Secondary | ICD-10-CM | POA: Diagnosis not present

## 2022-11-25 DIAGNOSIS — Z01419 Encounter for gynecological examination (general) (routine) without abnormal findings: Secondary | ICD-10-CM | POA: Diagnosis not present

## 2022-12-18 ENCOUNTER — Other Ambulatory Visit (HOSPITAL_COMMUNITY): Payer: Self-pay

## 2022-12-18 DIAGNOSIS — N711 Chronic inflammatory disease of uterus: Secondary | ICD-10-CM | POA: Diagnosis not present

## 2022-12-18 DIAGNOSIS — Z3200 Encounter for pregnancy test, result unknown: Secondary | ICD-10-CM | POA: Diagnosis not present

## 2022-12-18 DIAGNOSIS — E282 Polycystic ovarian syndrome: Secondary | ICD-10-CM | POA: Diagnosis not present

## 2022-12-18 DIAGNOSIS — N979 Female infertility, unspecified: Secondary | ICD-10-CM | POA: Diagnosis not present

## 2022-12-18 DIAGNOSIS — Z3141 Encounter for fertility testing: Secondary | ICD-10-CM | POA: Diagnosis not present

## 2022-12-18 MED ORDER — NORETHINDRONE ACETATE 5 MG PO TABS
5.0000 mg | ORAL_TABLET | Freq: Every day | ORAL | 0 refills | Status: AC
  Filled 2022-12-18: qty 10, 10d supply, fill #0

## 2022-12-18 MED ORDER — DOXYCYCLINE HYCLATE 50 MG PO CAPS
50.0000 mg | ORAL_CAPSULE | Freq: Two times a day (BID) | ORAL | 0 refills | Status: DC
  Filled 2022-12-18: qty 10, 5d supply, fill #0

## 2022-12-28 DIAGNOSIS — Z1231 Encounter for screening mammogram for malignant neoplasm of breast: Secondary | ICD-10-CM | POA: Diagnosis not present

## 2023-01-07 ENCOUNTER — Encounter: Payer: Self-pay | Admitting: Internal Medicine

## 2023-01-07 ENCOUNTER — Other Ambulatory Visit: Payer: Self-pay | Admitting: Internal Medicine

## 2023-01-08 ENCOUNTER — Other Ambulatory Visit (HOSPITAL_COMMUNITY): Payer: Self-pay

## 2023-01-08 MED ORDER — LISDEXAMFETAMINE DIMESYLATE 40 MG PO CAPS
40.0000 mg | ORAL_CAPSULE | ORAL | 0 refills | Status: DC
Start: 2023-01-08 — End: 2023-11-25
  Filled 2023-01-08 – 2023-05-03 (×2): qty 30, 30d supply, fill #0

## 2023-01-18 ENCOUNTER — Other Ambulatory Visit (HOSPITAL_COMMUNITY): Payer: Self-pay

## 2023-01-21 ENCOUNTER — Ambulatory Visit: Payer: Commercial Managed Care - PPO | Admitting: Dermatology

## 2023-03-12 ENCOUNTER — Encounter: Payer: Self-pay | Admitting: Internal Medicine

## 2023-03-24 ENCOUNTER — Encounter: Payer: Self-pay | Admitting: Internal Medicine

## 2023-03-24 NOTE — Progress Notes (Signed)
 Subjective:    Patient ID: Candace [name], female    DOB: 21-Feb-1980, 43 y.o.   MRN: [id number]      HPI Candace [name] is here for a Physical exam and her chronic medical problems.      Medications and allergies reviewed with patient and updated if appropriate.  Current Outpatient Medications on File Prior to Visit  Medication Sig Dispense Refill  . hydroquinone  4 % cream Apply topically as directed. Apply every night under breast and every other night to underarms x 3 months 28.35 g 2  . lisdexamfetamine (VYVANSE ) 40 MG capsule Take 1 capsule (40 mg) by mouth every morning. 30 capsule 0  . metroNIDAZOLE  (METROGEL ) 0.75 % vaginal gel Insert 1 applicatorful vaginally daily for 5 days. 70 g 0  . norethindrone  (AYGESTIN ) 5 MG tablet Take 1 tablet (5 mg total) by mouth daily. 10 tablet 0   No current facility-administered medications on file prior to visit.    Review of Systems     Objective:  There were no vitals filed for this visit. There were no vitals filed for this visit. There is no height or weight on file to calculate BMI.  BP Readings from Last 3 Encounters:  10/13/23 130/84  07/26/23 (!) 128/96  07/15/23 (!) 145/99    Wt Readings from Last 3 Encounters:  10/13/23 191 lb (86.6 kg)  09/08/23 196 lb 3.2 oz (89 kg)  07/12/23 199 lb (90.3 kg)       Physical Exam Constitutional: She appears well-developed and well-nourished. No distress.  HENT:  Head: Normocephalic and atraumatic.  Right Ear: External ear normal. Normal ear canal and TM Left Ear: External ear normal.  Normal ear canal and TM Mouth/Throat: Oropharynx is clear and moist.  Eyes: Conjunctivae normal.  Neck: Neck supple. No tracheal deviation present. No thyromegaly present.  No carotid bruit  Cardiovascular: Normal rate, regular rhythm and normal heart sounds.   No murmur heard.  No edema. Pulmonary/Chest: Effort normal and breath sounds normal. No respiratory distress. She has no wheezes. She has  no rales.  Breast: deferred   Abdominal: Soft. She exhibits no distension. There is no tenderness.  Lymphadenopathy: She has no cervical adenopathy.  Skin: Skin is warm and dry. She is not diaphoretic.  Psychiatric: She has a normal mood and affect. Her behavior is normal.     Lab Results  Component Value Date   WBC 4.2 05/04/2023   HGB 12.5 05/04/2023   HCT 37.6 05/04/2023   PLT 214.0 05/04/2023   GLUCOSE 91 05/04/2023   CHOL 208 (H) 05/04/2023   TRIG 57.0 05/04/2023   HDL 56.20 05/04/2023   LDLCALC 141 (H) 05/04/2023   ALT 13 05/04/2023   AST 13 05/04/2023   NA 137 05/04/2023   K 3.3 (L) 05/04/2023   CL 102 05/04/2023   CREATININE 0.72 05/04/2023   BUN 10 05/04/2023   CO2 26 05/04/2023   TSH 1.52 05/04/2023   HGBA1C 5.8 03/19/2022         Assessment & Plan:   Physical exam: Screening blood work  ordered Exercise   Weight   Substance abuse  none   Reviewed recommended immunizations.   Health Maintenance  Topic Date Due  . COVID-19 Vaccine (1) Never done  . Hepatitis B Vaccines 19-59 Average Risk (1 of 3 - 19+ 3-dose series) Never done  . HPV VACCINES (1 - 3-dose SCDM series) Never done  . Mammogram  12/09/2022  . Hepatitis C Screening  02/04/2041 (Originally 10/27/1998)  . Cervical Cancer Screening (HPV/Pap Cotest)  10/29/2024  . DTaP/Tdap/Td (2 - Td or Tdap) 12/29/2025  . HIV Screening  Completed  . Pneumococcal Vaccine  Aged Out  . Meningococcal B Vaccine  Aged Out  . Influenza Vaccine  Discontinued          See Problem List for Assessment and Plan of chronic medical problems.     This encounter was created in error - please disregard.

## 2023-03-24 NOTE — Patient Instructions (Addendum)

## 2023-03-25 ENCOUNTER — Ambulatory Visit: Payer: Commercial Managed Care - PPO | Admitting: Dermatology

## 2023-03-26 ENCOUNTER — Encounter: Payer: Commercial Managed Care - PPO | Admitting: Internal Medicine

## 2023-03-26 DIAGNOSIS — R7303 Prediabetes: Secondary | ICD-10-CM

## 2023-03-26 DIAGNOSIS — E042 Nontoxic multinodular goiter: Secondary | ICD-10-CM

## 2023-03-26 DIAGNOSIS — E559 Vitamin D deficiency, unspecified: Secondary | ICD-10-CM

## 2023-03-26 DIAGNOSIS — R41840 Attention and concentration deficit: Secondary | ICD-10-CM

## 2023-03-26 DIAGNOSIS — Z0000 Encounter for general adult medical examination without abnormal findings: Secondary | ICD-10-CM

## 2023-03-26 DIAGNOSIS — I10 Essential (primary) hypertension: Secondary | ICD-10-CM

## 2023-03-26 NOTE — Assessment & Plan Note (Signed)
Chronic Controlled, stable Continue vyvanse 40 mg daily - takes it only for work

## 2023-03-26 NOTE — Assessment & Plan Note (Signed)
 Chronic Check vitamin D level Continue 2000 units vit d daily

## 2023-03-26 NOTE — Assessment & Plan Note (Signed)
 Chronic Lab Results  Component Value Date   HGBA1C 5.8 03/19/2022   Check a1c Low sugar / carb diet Stressed regular exercise

## 2023-03-26 NOTE — Assessment & Plan Note (Signed)
Chronic Was stable on imaging so no further imaging necessary TSH

## 2023-03-26 NOTE — Assessment & Plan Note (Signed)
 Chronic BP borderline  Advised to start monitoring BP at home - goal < 130/80  cmp, CBC On spironolactone for acne - 50 mg daily, which is controlling her BP

## 2023-03-30 ENCOUNTER — Encounter: Payer: Self-pay | Admitting: Dermatology

## 2023-03-30 ENCOUNTER — Other Ambulatory Visit (HOSPITAL_COMMUNITY): Payer: Self-pay

## 2023-03-30 ENCOUNTER — Ambulatory Visit: Payer: Commercial Managed Care - PPO | Admitting: Dermatology

## 2023-03-30 VITALS — BP 155/105

## 2023-03-30 DIAGNOSIS — L83 Acanthosis nigricans: Secondary | ICD-10-CM | POA: Diagnosis not present

## 2023-03-30 DIAGNOSIS — L304 Erythema intertrigo: Secondary | ICD-10-CM

## 2023-03-30 DIAGNOSIS — L709 Acne, unspecified: Secondary | ICD-10-CM | POA: Diagnosis not present

## 2023-03-30 DIAGNOSIS — L700 Acne vulgaris: Secondary | ICD-10-CM

## 2023-03-30 MED ORDER — ARAZLO 0.045 % EX LOTN: 1.0000 | TOPICAL_LOTION | Freq: Every day | CUTANEOUS | 2 refills | Status: DC

## 2023-03-30 MED ORDER — NYSTATIN-TRIAMCINOLONE 100000-0.1 UNIT/GM-% EX OINT
1.0000 | TOPICAL_OINTMENT | Freq: Two times a day (BID) | CUTANEOUS | 1 refills | Status: AC
  Filled 2023-03-30: qty 30, 7d supply, fill #0

## 2023-03-30 MED ORDER — SAFETY SEAL MISCELLANEOUS MISC: 1.0000 | Freq: Two times a day (BID) | 3 refills | Status: DC

## 2023-03-30 NOTE — Patient Instructions (Addendum)
 Hello Candace [name],  Thank you for visiting [place] today. Your dedication to enhancing your skin health is greatly appreciated. Below is a summary of the essential instructions from our visit:  Medications and Usage:   Arazlo: Restart application 2 nights a week, increasing to 3 nights after one month. Ensure to apply a pea-sized amount to the entire face.   Niacinamide- Triamcinolone Cream: Apply for up to seven days for yeast overgrowth under the arms and breasts, then pause usage. A refill has been provided.   New Topical Lightening Cream: To be compounded by [facility], including a blend of mild retinoid, transaminic acid, vitamin C, kojic acid, resveratrol, and hydrocortisone, priced at $45. To minimize irritation, mix with an equal part of moisturizer before application.  Skincare Products:   Cleanser: Continue with Dove.   Moisturizer: Utilize a morning moisturizer that includes sunscreen, such as La Roche-Posay.   Antifungal Powder: Consider using Zeasorb AF to maintain dryness in prone areas and prevent yeast overgrowth.  Lifestyle Recommendations:   Avoid the use of alcohol pads to prevent skin dryness.   Keep an eye on your diet and exercise regimen to manage blood sugar levels and avert pre-diabetes.   Follow-Up:   Please schedule a follow-up appointment in 3-4 months to evaluate your progress and address any new concerns.  Adhere to these instructions diligently, and should you have any questions or require further assistance, do not hesitate to reach out to our office. We will also address your hair concerns during your next visit.  Warm regards,  Dr. Langston Reusing, Dermatology

## 2023-03-30 NOTE — Progress Notes (Signed)
   Follow-Up Visit   Subjective  Candace [name] is a 43 y.o. female who presents for the following: She is here for a follow up of acne of face and hyperpigmentation of axillary areas and inframammary areas. She was prescribed Arazlo lotion, Spironolactone and Minocycline for acne and Hydroquinone 4% for hyperpigmentation. She satates that she has not been using any of the medications she was prescribed and she just needs to "start over". She is only having acne flares 1-2 times per months and they are small white bumps. She also complains of excessive hair shedding today.    The following portions of the chart were reviewed this encounter and updated as appropriate: medications, allergies, medical history  Review of Systems:  No other skin or systemic complaints except as noted in HPI or Assessment and Plan.  Objective  Well appearing patient in no apparent distress; mood and affect are within normal limits.  A focused examination was performed of the following areas:   Relevant exam findings are noted in the Assessment and Plan.    Assessment & Plan   Acne Assessment: Patient previously treated with Arazlo, spironolactone, and minocycline, showing effectiveness for 2-3 months before discontinuation. Currently, the patient reports 1-2 new whiteheads per week without deep cysts. No ongoing treatment.  Plan:   Restart Arazlo, beginning with 2 nights per week, increasing to 3 nights per week after one month.   Instruct patient to apply a pea-sized amount to the whole face, followed by moisturizer.   Continue using Dove for cleansing.   Add a morning moisturizer with sunscreen, such as La Roche-Posay.   Advise against the use of alcohol pads.   Schedule follow-up in 3-4 months.  Intertrigo Assessment: Patient reports intermittent darkness under arms and breasts, worsening in summer. Previous treatment with niacinamide triamcinolone showed variable effectiveness.  Plan:   Refill  prescription for niacinamide triamcinolone, instructing use for up to 7 days, then discontinue to prevent skin thinning.   Recommend Zeasorb AF antifungal powder for prevention.   Monitor for chronic recurrence.  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 Recent hemoglobin A1C of 5.8%, indicating slightly elevated but normal glucose levels. Patient has been using hydroquinone for approximately 2 months without significant improvement.  Plan:   Discontinue hydroquinone.   Prescribe a compounded topical medication containing tranexminic acid, retinoid, vitamin C, kojic acid, resveratrol, and hydrocortisone.   Instruct patient to apply a mixture of half moisturizer and half prescribed cream to reduce irritation.   Recommend diet and exercise modifications to prevent pre-diabetes progression.   Request hemoglobin A1C check at the next primary care appointment.     ACNE VULGARIS   Related Medications Tazarotene (ARAZLO) 0.045 % LOTN Apply 1 application  topically at bedtime. Apply pea sized amount to face at bedtime 2 nights per week x 1 month then increase to 3 nights per week.  Return for 3-4 months , Follow up.  I, Joanie Coddington, CMA, am acting as scribe for [employer], DO .   Documentation: I have reviewed the above documentation for accuracy and completeness, and I agree with the above.  Langston Reusing, DO

## 2023-04-05 ENCOUNTER — Encounter: Payer: Self-pay | Admitting: Internal Medicine

## 2023-04-05 ENCOUNTER — Ambulatory Visit: Payer: Commercial Managed Care - PPO | Admitting: Dermatology

## 2023-04-05 ENCOUNTER — Encounter: Payer: Self-pay | Admitting: Dermatology

## 2023-04-05 VITALS — BP 169/116

## 2023-04-05 DIAGNOSIS — L649 Androgenic alopecia, unspecified: Secondary | ICD-10-CM

## 2023-04-05 DIAGNOSIS — L650 Telogen effluvium: Secondary | ICD-10-CM | POA: Diagnosis not present

## 2023-04-05 MED ORDER — SAFETY SEAL MISCELLANEOUS MISC
2 refills | Status: AC
Start: 2023-04-05 — End: ?

## 2023-04-05 NOTE — Patient Instructions (Addendum)
 Hello Aubrina,  Thank you for visiting [place] today.   Here is a summary of the key instructions and recommendations from today's consultation:  Viviscal Continuation: Keep using Viviscal for a period of four to six months to observe results.  Minoxidil -Finasteride Drops:   Application: In the morning, apply these drops to the affected scalp areas using a cotton ball or Q-tip.   Pharmacy Contact: [facility] will reach out to you regarding delivery details.  Nutrition and Supplements:   Dietary Increase: Boost your nutrition intake.   Collagen Supplements: Continue with your collagen supplements.   Alternative Option: If Viviscal does not meet your expectations after six months, consider transitioning to Nutrafol without the need to taper off.  Hair Care Routine:   Frequency: Maintain your hair care routine bi-weekly.   Product Recommendation: Consider using Biolage dry shampoo to maintain scalp health.  Follow-Up Appointment: We will schedule a follow-up in four months to evaluate your hair regrowth and discuss your acne treatment. We have taken pictures today for our records, which will be used for comparison during your next visit.  Thank you once again for your visit today. I am looking forward to our next appointment and the opportunity to assess your progress.  Warm regards,  Dr. [facility]        Important Information  Due to recent changes in healthcare laws, you may see results of your pathology and/or laboratory studies on MyChart before the doctors have had a chance to review them. We understand that in some cases there may be results that are confusing or concerning to you. Please understand that not all results are received at the same time and often the doctors may need to interpret multiple results in order to provide you with the best plan of care or course of treatment. Therefore, we ask that you please give [place] 2 business days to thoroughly review  all your results before contacting the office for clarification. Should we see a critical lab result, you will be contacted sooner.   If You Need Anything After Your Visit  If you have any questions or concerns for your doctor, please call our main line at [phone] If no one answers, please leave a voicemail as directed and we will return your call as soon as possible. Messages left after 4 pm will be answered the following business day.   You may also send [place] a message via MyChart. We typically respond to MyChart messages within 1-2 business days.  For prescription refills, please ask your pharmacy to contact our office. Our fax number is [phone].  If you have an urgent issue when the clinic is closed that cannot wait until the next business day, you can page your doctor at the number below.    Please note that while we do our best to be available for urgent issues outside of office hours, we are not available 24/7.   If you have an urgent issue and are unable to reach [place], you may choose to seek medical care at your doctor's office, retail clinic, urgent care center, or emergency room.  If you have a medical emergency, please immediately call 911 or go to the emergency department. In the event of inclement weather, please call our main line at [phone] for an update on the status of any delays or closures.  Dermatology Medication Tips: Please keep the boxes that topical medications come in in order to help keep track of the instructions about where  and how to use these. Pharmacies typically print the medication instructions only on the boxes and not directly on the medication tubes.   If your medication is too expensive, please contact our office at [phone] or send [place] a message through MyChart.   We are unable to tell what your co-pay for medications will be in advance as this is different depending on your insurance coverage. However, we may be able to find a substitute  medication at lower cost or fill out paperwork to get insurance to cover a needed medication.   If a prior authorization is required to get your medication covered by your insurance company, please allow [place] 1-2 business days to complete this process.  Drug prices often vary depending on where the prescription is filled and some pharmacies may offer cheaper prices.  The website www.goodrx.com contains coupons for medications through different pharmacies. The prices here do not account for what the cost may be with help from insurance (it may be cheaper with your insurance), but the website can give you the price if you did not use any insurance.  - You can print the associated coupon and take it with your prescription to the pharmacy.  - You may also stop by our office during regular business hours and pick up a GoodRx coupon card.  - If you need your prescription sent electronically to a different pharmacy, notify our office through [facility] or by phone at [phone]

## 2023-04-05 NOTE — Progress Notes (Signed)
      Subjective:    Patient ID: Candace [name], female    DOB: 11-02-1980, 43 y.o.   MRN: [id number]     HPI Candace [name] is here for follow up of her chronic medical problems.    Medications and allergies reviewed with patient and updated if appropriate.  Current Outpatient Medications on File Prior to Visit  Medication Sig Dispense Refill  . hydroquinone  4 % cream Apply topically as directed. Apply every night under breast and every other night to underarms x 3 months 28.35 g 2  . metroNIDAZOLE  (METROGEL ) 0.75 % vaginal gel Insert 1 applicatorful vaginally daily for 5 days. 70 g 0  . norethindrone  (AYGESTIN ) 5 MG tablet Take 1 tablet (5 mg total) by mouth daily. 10 tablet 0  . nystatin -triamcinolone  ointment (MYCOLOG) Apply topically 2 times daily to affected areas under breast for 1 week 30 g 1  . Safety Seal Miscellaneous MISC Brogaine with minoxidil usp 7% and finasteride USP 0.1% apply to the affected areas with cotton ball or qtip every morning. 30 mL 2   No current facility-administered medications on file prior to visit.     Review of Systems     Objective:  There were no vitals filed for this visit. BP Readings from Last 3 Encounters:  11/25/23 138/68  11/23/23 136/70  10/13/23 130/84   Wt Readings from Last 3 Encounters:  11/25/23 183 lb (83 kg)  11/23/23 188 lb (85.3 kg)  10/13/23 191 lb (86.6 kg)   There is no height or weight on file to calculate BMI.    Physical Exam     Lab Results  Component Value Date   WBC 4.2 05/04/2023   HGB 12.5 05/04/2023   HCT 37.6 05/04/2023   PLT 214.0 05/04/2023   GLUCOSE 86 11/25/2023   CHOL 188 11/25/2023   TRIG 41.0 11/25/2023   HDL 51.90 11/25/2023   LDLCALC 128 (H) 11/25/2023   ALT 13 05/04/2023   AST 13 05/04/2023   NA 138 11/25/2023   K 3.4 (L) 11/25/2023   CL 104 11/25/2023   CREATININE 0.65 11/25/2023   BUN 16 11/25/2023   CO2 26 11/25/2023   TSH 1.67 11/25/2023   HGBA1C 5.8 11/25/2023      Assessment & Plan:    See Problem List for Assessment and Plan of chronic medical problems.    This encounter was created in error - please disregard.

## 2023-04-05 NOTE — Patient Instructions (Addendum)
      Blood work was ordered.       Medications changes include :   None    A referral was ordered and someone will call you to schedule an appointment.     Return in about 6 months (around 10/07/2023) for Physical Exam.

## 2023-04-05 NOTE — Progress Notes (Unsigned)
   Follow-Up Visit   Subjective  Candace [name] is a 43 y.o. female who presents for the following: Hair Shedding  Patient present today for follow up visit. Patient was last evaluated on 03/30/23 for intertrigo. Pt is here today for a new issue. Pt stated that she has been having excessive hair shedding that she noticed in the beginning of January. She stated that last year she was wearing braids a lot and did not notice it as much but noted that "it comes out in clumps". Patient denies medication changes.  The following portions of the chart were reviewed this encounter and updated as appropriate: medications, allergies, medical history  Review of Systems:  No other skin or systemic complaints except as noted in HPI or Assessment and Plan.  Objective  Well appearing patient in no apparent distress; mood and affect are within normal limits.   A focused examination was performed of the following areas: hair & scalp   Relevant exam findings are noted in the Assessment and Plan.           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  Telogen effluvium is a benign, self-limited condition causing increased hair shedding usually for several months. It does not progress to baldness, and the hair eventually grows back on its own. It can be triggered by recent illness, recent surgery, thyroid disease, low iron stores, vitamin D deficiency, fad diets or rapid weight loss, hormonal changes such as pregnancy or birth control pills, and some medication. Usually the hair loss starts 2-3 months after the illness or health change. Rarely, it can continue for longer than a year. Treatments options may include oral or topical Minoxidil; Red Light scalp treatments; Biotin 2.5 mg daily and other options.  ANDROGENETIC ALOPECIA (FEMALE PATTERN HAIR LOSS) Exam: Diffuse thinning of the crown and widening of the midline part with retention of the frontal  hairline  flared  Female Androgenic Alopecia is a chronic condition related to genetics and/or hormonal changes.  In women androgenetic alopecia is commonly associated with menopause but may occur any time after puberty.  It causes hair thinning primarily on the crown with widening of the part and temporal hairline recession.  Can use OTC Rogaine (minoxidil) 5% solution/foam as directed.  Oral treatments in female patients who have no contraindication may include : - Low dose oral minoxidil 1.25 - 5mg  daily - Spironolactone 50 - 100mg  bid - Finasteride 2.5 - 5 mg daily Adjunctive therapies include: - Low Level Laser Light Therapy (LLLT) - Platelet-rich plasma injections (PRP) - Hair Transplants or scalp reduction    Treatment Plan: - Recommended taking OTC Viviscal & Vital Proteins - Rx Brogaine Solution from [employer] with minoxidil/finasteride - apply to affected areas with a cotton ball or qtip every morning.    Long term medication management.  Patient is using long term (months to years) prescription medication  to control their dermatologic condition.  These medications require periodic monitoring to evaluate for efficacy and side effects and may require periodic laboratory monitoring.       No follow-ups on file.    Documentation: I have reviewed the above documentation for accuracy and completeness, and I agree with the above.   I, Shirron Marcha Solders, CMA, am acting as scribe for [employer], DO.   Langston Reusing, DO

## 2023-04-06 ENCOUNTER — Encounter: Payer: Commercial Managed Care - PPO | Admitting: Internal Medicine

## 2023-04-06 DIAGNOSIS — I10 Essential (primary) hypertension: Secondary | ICD-10-CM

## 2023-04-06 DIAGNOSIS — R7303 Prediabetes: Secondary | ICD-10-CM

## 2023-04-06 DIAGNOSIS — E6609 Other obesity due to excess calories: Secondary | ICD-10-CM

## 2023-04-06 DIAGNOSIS — R41840 Attention and concentration deficit: Secondary | ICD-10-CM

## 2023-04-06 DIAGNOSIS — G4733 Obstructive sleep apnea (adult) (pediatric): Secondary | ICD-10-CM

## 2023-04-06 DIAGNOSIS — E559 Vitamin D deficiency, unspecified: Secondary | ICD-10-CM

## 2023-04-06 NOTE — Assessment & Plan Note (Signed)
Chronic Controlled, stable Continue vyvanse 40 mg daily - takes it only for work

## 2023-04-06 NOTE — Assessment & Plan Note (Signed)
 Chronic Has tried contrave - not effective Stressed regular exercise and healthy diet  Stressed that lifestyle changes are the only thing that really work long term

## 2023-04-06 NOTE — Assessment & Plan Note (Signed)
 Chronic BP borderline  Advised to start monitoring BP at home - goal < 130/80  cmp, CBC On spironolactone for acne - 50 mg daily, which is controlling her BP

## 2023-04-06 NOTE — Assessment & Plan Note (Signed)
Chronic Lost weight - not on cpap

## 2023-04-06 NOTE — Assessment & Plan Note (Signed)
 Chronic Lab Results  Component Value Date   HGBA1C 5.8 03/19/2022   Check a1c Low sugar / carb diet Stressed regular exercise

## 2023-04-06 NOTE — Assessment & Plan Note (Signed)
 Chronic Check vitamin D level Continue 2000 units vit d daily

## 2023-04-07 ENCOUNTER — Other Ambulatory Visit (HOSPITAL_COMMUNITY): Payer: Self-pay

## 2023-04-08 ENCOUNTER — Other Ambulatory Visit (HOSPITAL_COMMUNITY): Payer: Self-pay

## 2023-04-19 ENCOUNTER — Telehealth: Payer: Self-pay | Admitting: Podiatry

## 2023-04-19 NOTE — Telephone Encounter (Signed)
 Pt called had laser treatment last year for nail fungus and was told it may come back and she is wanting to know if she can just come back in for the laser treatment or does she have to see provider first. Last laser was 04/24/22. Last office visit was 02/05/2022.  Please Advise

## 2023-04-21 NOTE — Telephone Encounter (Signed)
 Left message for pt to call to schedule an appt for laser that it was ok per provider she just come in for laser. As of now next available is beginning of may.

## 2023-04-21 NOTE — Telephone Encounter (Signed)
 She can start laser without seeing me

## 2023-05-03 ENCOUNTER — Other Ambulatory Visit (HOSPITAL_COMMUNITY): Payer: Self-pay

## 2023-05-03 ENCOUNTER — Encounter: Payer: Self-pay | Admitting: Internal Medicine

## 2023-05-03 NOTE — Patient Instructions (Addendum)
 Blood work was ordered.       Medications changes include :   start Bystolic 10 mg daily.  Zepbound 2.5 mg weekly   Monitor BP at home     Return in about 6 months (around 11/03/2023) for follow up.   Health Maintenance, Female Adopting a healthy lifestyle and getting preventive care are important in promoting health and wellness. Ask your health care provider about: The right schedule for you to have regular tests and exams. Things you can do on your own to prevent diseases and keep yourself healthy. What should I know about diet, weight, and exercise? Eat a healthy diet  Eat a diet that includes plenty of vegetables, fruits, low-fat dairy products, and lean protein. Do not eat a lot of foods that are high in solid fats, added sugars, or sodium. Maintain a healthy weight Body mass index (BMI) is used to identify weight problems. It estimates body fat based on height and weight. Your health care provider can help determine your BMI and help you achieve or maintain a healthy weight. Get regular exercise Get regular exercise. This is one of the most important things you can do for your health. Most adults should: Exercise for at least 150 minutes each week. The exercise should increase your heart rate and make you sweat (moderate-intensity exercise). Do strengthening exercises at least twice a week. This is in addition to the moderate-intensity exercise. Spend less time sitting. Even light physical activity can be beneficial. Watch cholesterol and blood lipids Have your blood tested for lipids and cholesterol at 43 years of age, then have this test every 5 years. Have your cholesterol levels checked more often if: Your lipid or cholesterol levels are high. You are older than 43 years of age. You are at high risk for heart disease. What should I know about cancer screening? Depending on your health history and family history, you may need to have cancer screening at various  ages. This may include screening for: Breast cancer. Cervical cancer. Colorectal cancer. Skin cancer. Lung cancer. What should I know about heart disease, diabetes, and high blood pressure? Blood pressure and heart disease High blood pressure causes heart disease and increases the risk of stroke. This is more likely to develop in people who have high blood pressure readings or are overweight. Have your blood pressure checked: Every 3-5 years if you are 80-62 years of age. Every year if you are 78 years old or older. Diabetes Have regular diabetes screenings. This checks your fasting blood sugar level. Have the screening done: Once every three years after age 3 if you are at a normal weight and have a low risk for diabetes. More often and at a younger age if you are overweight or have a high risk for diabetes. What should I know about preventing infection? Hepatitis B If you have a higher risk for hepatitis B, you should be screened for this virus. Talk with your health care provider to find out if you are at risk for hepatitis B infection. Hepatitis C Testing is recommended for: Everyone born from [birth year] through 1965. Anyone with known risk factors for hepatitis C. Sexually transmitted infections (STIs) Get screened for STIs, including gonorrhea and chlamydia, if: You are sexually active and are younger than 43 years of age. You are older than 43 years of age and your health care provider tells you that you are at risk for this type of infection. Your sexual activity has changed  since you were last screened, and you are at increased risk for chlamydia or gonorrhea. Ask your health care provider if you are at risk. Ask your health care provider about whether you are at high risk for HIV. Your health care provider may recommend a prescription medicine to help prevent HIV infection. If you choose to take medicine to prevent HIV, you should first get tested for HIV. You should then be tested  every 3 months for as long as you are taking the medicine. Pregnancy If you are about to stop having your period (premenopausal) and you may become pregnant, seek counseling before you get pregnant. Take 400 to 800 micrograms (mcg) of folic acid every day if you become pregnant. Ask for birth control (contraception) if you want to prevent pregnancy. Osteoporosis and menopause Osteoporosis is a disease in which the bones lose minerals and strength with aging. This can result in bone fractures. If you are 77 years old or older, or if you are at risk for osteoporosis and fractures, ask your health care provider if you should: Be screened for bone loss. Take a calcium or vitamin D supplement to lower your risk of fractures. Be given hormone replacement therapy (HRT) to treat symptoms of menopause. Follow these instructions at home: Alcohol use Do not drink alcohol if: Your health care provider tells you not to drink. You are pregnant, may be pregnant, or are planning to become pregnant. If you drink alcohol: Limit how much you have to: 0-1 drink a day. Know how much alcohol is in your drink. In the [place] one drink equals one 12 oz bottle of beer (355 mL), one 5 oz glass of wine (148 mL), or one 1 oz glass of hard liquor (44 mL). Lifestyle Do not use any products that contain nicotine or tobacco. These products include cigarettes, chewing tobacco, and vaping devices, such as e-cigarettes. If you need help quitting, ask your health care provider. Do not use street drugs. Do not share needles. Ask your health care provider for help if you need support or information about quitting drugs. General instructions Schedule regular health, dental, and eye exams. Stay current with your vaccines. Tell your health care provider if: You often feel depressed. You have ever been abused or do not feel safe at home. Summary Adopting a healthy lifestyle and getting preventive care are important in promoting  health and wellness. Follow your health care provider's instructions about healthy diet, exercising, and getting tested or screened for diseases. Follow your health care provider's instructions on monitoring your cholesterol and blood pressure. This information is not intended to replace advice given to you by your health care provider. Make sure you discuss any questions you have with your health care provider. Document Revised: 06/10/2020 Document Reviewed: 06/10/2020 Elsevier Patient Education  2024 [employer].

## 2023-05-03 NOTE — Progress Notes (Unsigned)
 Subjective:    Patient ID: Candace [name], female    DOB: 30-Nov-1980, 43 y.o.   MRN: [id number]      HPI [name] is here for a Physical exam and her chronic medical problems.   BP has been high - feels like she is retaining fluid.    Has not been able to lose weight - would like help.  Wondered about zepbound   Medications and allergies reviewed with patient and updated if appropriate.  Current Outpatient Medications on File Prior to Visit  Medication Sig Dispense Refill   hydroquinone 4 % cream Apply topically as directed. Apply every night under breast and every other night to underarms x 3 months 28.35 g 2   lisdexamfetamine (VYVANSE) 40 MG capsule Take 1 capsule (40 mg) by mouth every morning. 30 capsule 0   metroNIDAZOLE (METROGEL) 0.75 % vaginal gel Insert 1 applicatorful vaginally daily for 5 days. 70 g 0   nystatin-triamcinolone ointment (MYCOLOG) Apply topically 2 times daily to affected areas under breast for 1 week 30 g 1   Safety Seal Miscellaneous MISC Brogaine with minoxidil usp 7% and finasteride USP 0.1% apply to the affected areas with cotton ball or qtip every morning. 30 mL 2   Tazarotene (ARAZLO) 0.045 % LOTN Apply 1 application  topically at bedtime. Apply pea sized amount to face at bedtime 2 nights per week x 1 month then increase to 3 nights per week. 45 g 2   norethindrone (AYGESTIN) 5 MG tablet Take 1 tablet (5 mg total) by mouth daily. 10 tablet 0   No current facility-administered medications on file prior to visit.    Review of Systems  Constitutional:  Negative for fever.  Eyes:  Negative for visual disturbance.  Respiratory:  Negative for cough, shortness of breath and wheezing.   Cardiovascular:  Positive for leg swelling (a little at times). Negative for chest pain and palpitations.  Gastrointestinal:  Negative for abdominal pain, blood in stool, constipation and diarrhea.       No gerd  Genitourinary:  Negative for dysuria.  Musculoskeletal:   Negative for arthralgias and back pain.  Skin:  Negative for rash.  Neurological:  Positive for headaches (in morning). Negative for light-headedness.  Psychiatric/Behavioral:  Negative for dysphoric mood. The patient is nervous/anxious (increased a little - work).        Objective:   Vitals:   05/04/23 1301  BP: (!) 140/94  Pulse: 98  Temp: 98.5 F (36.9 C)  SpO2: 98%   Filed Weights   05/04/23 1301  Weight: 193 lb (87.5 kg)   Body mass index is 33.13 kg/m.  BP Readings from Last 3 Encounters:  05/04/23 (!) 140/94  04/05/23 (!) 169/116  03/30/23 (!) 155/105    Wt Readings from Last 3 Encounters:  05/04/23 193 lb (87.5 kg)  09/18/22 188 lb 3.2 oz (85.4 kg)  03/19/22 185 lb (83.9 kg)       Physical Exam Constitutional: She appears well-developed and well-nourished. No distress.  HENT:  Head: Normocephalic and atraumatic.  Right Ear: External ear normal. Normal ear canal and TM Left Ear: External ear normal.  Normal ear canal and TM Mouth/Throat: Oropharynx is clear and moist.  Eyes: Conjunctivae normal.  Neck: Neck supple. No tracheal deviation present. No thyromegaly present.  No carotid bruit  Cardiovascular: Normal rate, regular rhythm and normal heart sounds.   No murmur heard.  No edema. Pulmonary/Chest: Effort normal and breath sounds normal. No respiratory distress.  She has no wheezes. She has no rales.  Breast: deferred   Abdominal: Soft. She exhibits no distension. There is no tenderness.  Lymphadenopathy: She has no cervical adenopathy.  Skin: Skin is warm and dry. She is not diaphoretic.  Psychiatric: She has a normal mood and affect. Her behavior is normal.     Lab Results  Component Value Date   WBC 4.0 10/28/2021   HGB 11.8 (L) 10/28/2021   HCT 35.0 (L) 10/28/2021   PLT 177.0 10/28/2021   GLUCOSE 91 03/19/2022   CHOL 178 10/28/2021   TRIG 81.0 10/28/2021   HDL 48.50 10/28/2021   LDLCALC 114 (H) 10/28/2021   ALT 10 03/19/2022   AST 13  03/19/2022   NA 139 03/19/2022   K 3.6 03/19/2022   CL 106 03/19/2022   CREATININE 0.72 03/19/2022   BUN 19 03/19/2022   CO2 27 03/19/2022   TSH 2.24 10/28/2021   HGBA1C 5.8 03/19/2022         Assessment & Plan:   Physical exam: Screening blood work  ordered Exercise  none Weight  obese - encouraged weight loss Substance abuse  none   Reviewed recommended immunizations.   Health Maintenance  Topic Date Due   MAMMOGRAM  12/09/2022   COVID-19 Vaccine (1) 05/19/2023 (Originally 10/26/1985)   Hepatitis C Screening  02/04/2041 (Originally 10/27/1998)   Cervical Cancer Screening (HPV/Pap Cotest)  10/29/2024   DTaP/Tdap/Td (2 - Td or Tdap) 12/29/2025   HIV Screening  Completed   HPV VACCINES  Aged Out   INFLUENZA VACCINE  Discontinued          See Problem List for Assessment and Plan of chronic medical problems.

## 2023-05-04 ENCOUNTER — Ambulatory Visit (INDEPENDENT_AMBULATORY_CARE_PROVIDER_SITE_OTHER): Admitting: Internal Medicine

## 2023-05-04 ENCOUNTER — Other Ambulatory Visit (HOSPITAL_COMMUNITY): Payer: Self-pay

## 2023-05-04 VITALS — BP 130/84 | HR 98 | Temp 98.5°F | Ht 64.0 in | Wt 193.0 lb

## 2023-05-04 DIAGNOSIS — E66811 Obesity, class 1: Secondary | ICD-10-CM | POA: Diagnosis not present

## 2023-05-04 DIAGNOSIS — E6609 Other obesity due to excess calories: Secondary | ICD-10-CM

## 2023-05-04 DIAGNOSIS — I10 Essential (primary) hypertension: Secondary | ICD-10-CM

## 2023-05-04 DIAGNOSIS — E559 Vitamin D deficiency, unspecified: Secondary | ICD-10-CM | POA: Diagnosis not present

## 2023-05-04 DIAGNOSIS — E042 Nontoxic multinodular goiter: Secondary | ICD-10-CM

## 2023-05-04 DIAGNOSIS — E282 Polycystic ovarian syndrome: Secondary | ICD-10-CM | POA: Diagnosis not present

## 2023-05-04 DIAGNOSIS — Z0000 Encounter for general adult medical examination without abnormal findings: Secondary | ICD-10-CM | POA: Diagnosis not present

## 2023-05-04 DIAGNOSIS — Z6831 Body mass index (BMI) 31.0-31.9, adult: Secondary | ICD-10-CM | POA: Diagnosis not present

## 2023-05-04 DIAGNOSIS — R7303 Prediabetes: Secondary | ICD-10-CM | POA: Diagnosis not present

## 2023-05-04 DIAGNOSIS — R41840 Attention and concentration deficit: Secondary | ICD-10-CM

## 2023-05-04 LAB — COMPREHENSIVE METABOLIC PANEL WITH GFR
ALT: 13 U/L (ref 0–35)
AST: 13 U/L (ref 0–37)
Albumin: 4.4 g/dL (ref 3.5–5.2)
Alkaline Phosphatase: 47 U/L (ref 39–117)
BUN: 10 mg/dL (ref 6–23)
CO2: 26 meq/L (ref 19–32)
Calcium: 9.1 mg/dL (ref 8.4–10.5)
Chloride: 102 meq/L (ref 96–112)
Creatinine, Ser: 0.72 mg/dL (ref 0.40–1.20)
GFR: 103.06 mL/min (ref >60.00)
Glucose, Bld: 91 mg/dL (ref 70–99)
Potassium: 3.3 meq/L — ABNORMAL LOW (ref 3.5–5.1)
Sodium: 137 meq/L (ref 135–145)
Total Bilirubin: 0.7 mg/dL (ref 0.2–1.2)
Total Protein: 7.7 g/dL (ref 6.0–8.3)

## 2023-05-04 LAB — CBC WITH DIFFERENTIAL/PLATELET
Basophils Absolute: 0 10*3/uL (ref 0.0–0.1)
Basophils Relative: 0.7 % (ref 0.0–3.0)
Eosinophils Absolute: 0.2 10*3/uL (ref 0.0–0.7)
Eosinophils Relative: 5.9 % — ABNORMAL HIGH (ref 0.0–5.0)
HCT: 37.6 % (ref 36.0–46.0)
Hemoglobin: 12.5 g/dL (ref 12.0–15.0)
Lymphocytes Relative: 37.8 % (ref 12.0–46.0)
Lymphs Abs: 1.6 10*3/uL (ref 0.7–4.0)
MCHC: 33.1 g/dL (ref 30.0–36.0)
MCV: 87.7 fl (ref 78.0–100.0)
Monocytes Absolute: 0.4 10*3/uL (ref 0.1–1.0)
Monocytes Relative: 8.4 % (ref 3.0–12.0)
Neutro Abs: 2 10*3/uL (ref 1.4–7.7)
Neutrophils Relative %: 47.2 % (ref 43.0–77.0)
Platelets: 214 10*3/uL (ref 150.0–400.0)
RBC: 4.29 Mil/uL (ref 3.87–5.11)
RDW: 13.8 % (ref 11.5–15.5)
WBC: 4.2 10*3/uL (ref 4.0–10.5)

## 2023-05-04 LAB — LIPID PANEL
Cholesterol: 208 mg/dL — ABNORMAL HIGH (ref 0–200)
HDL: 56.2 mg/dL (ref >39.00)
LDL Cholesterol: 141 mg/dL — ABNORMAL HIGH (ref 0–99)
NonHDL: 152.18
Total CHOL/HDL Ratio: 4
Triglycerides: 57 mg/dL (ref 0.0–149.0)
VLDL: 11.4 mg/dL (ref 0.0–40.0)

## 2023-05-04 LAB — VITAMIN D 25 HYDROXY (VIT D DEFICIENCY, FRACTURES): VITD: 25.37 ng/mL — ABNORMAL LOW (ref 30.00–100.00)

## 2023-05-04 LAB — TSH: TSH: 1.52 u[IU]/mL (ref 0.35–5.50)

## 2023-05-04 MED ORDER — TIRZEPATIDE-WEIGHT MANAGEMENT 2.5 MG/0.5ML ~~LOC~~ SOLN: 2.5000 mg | SUBCUTANEOUS | 0 refills | Status: DC

## 2023-05-04 MED ORDER — NEBIVOLOL HCL 10 MG PO TABS
10.0000 mg | ORAL_TABLET | Freq: Every day | ORAL | 3 refills | Status: DC
  Filled 2023-05-04 – 2023-07-05 (×2): qty 90, 90d supply, fill #0

## 2023-05-04 NOTE — Assessment & Plan Note (Signed)
 Chronic Stable on imaging so no further imaging necessary TSH

## 2023-05-04 NOTE — Assessment & Plan Note (Addendum)
 Chronic Has tried contrave - not effective Stressed regular exercise and healthy diet  Stressed that lifestyle changes are the only thing that really work long term Would like to try zepbound temporarily - zepbound 2.5 mg weekly x 1 week via [facility] direct pharmacy Reviewed possible side effects

## 2023-05-04 NOTE — Assessment & Plan Note (Signed)
 Chronic Check vitamin D level Continue 2000 units vit d daily

## 2023-05-04 NOTE — Assessment & Plan Note (Addendum)
 Chronic Was on spironolactone for acne - this as been stopped which has likely contributed to the increased BP BP has been consistently high Advised monitoring BP at home - goal < 130/80  cmp, CBC Start Bystolic 10 mg daily - she did well with this in the past

## 2023-05-04 NOTE — Assessment & Plan Note (Signed)
 Chronic Lab Results  Component Value Date   HGBA1C 5.8 03/19/2022   Check a1c Low sugar / carb diet Stressed regular exercise

## 2023-05-04 NOTE — Assessment & Plan Note (Signed)
Chronic Controlled, stable Continue vyvanse 40 mg daily - takes it only for work

## 2023-05-04 NOTE — Assessment & Plan Note (Addendum)
 Chronic Following with gynecologist Not on any medication

## 2023-05-05 ENCOUNTER — Encounter: Payer: Self-pay | Admitting: Internal Medicine

## 2023-05-14 ENCOUNTER — Other Ambulatory Visit (HOSPITAL_COMMUNITY): Payer: Self-pay

## 2023-05-20 ENCOUNTER — Other Ambulatory Visit: Payer: Self-pay | Admitting: Internal Medicine

## 2023-05-20 MED ORDER — TIRZEPATIDE-WEIGHT MANAGEMENT 2.5 MG/0.5ML ~~LOC~~ SOLN: 2.5000 mg | SUBCUTANEOUS | 0 refills | Status: DC

## 2023-06-04 ENCOUNTER — Ambulatory Visit (INDEPENDENT_AMBULATORY_CARE_PROVIDER_SITE_OTHER)

## 2023-06-04 DIAGNOSIS — B351 Tinea unguium: Secondary | ICD-10-CM

## 2023-06-04 NOTE — Progress Notes (Signed)
 Patient presents today for the 1st laser treatment. Diagnosed with mycotic nail infection by Dr. Celia Coles.   Toenail most affected 1st, 4th and 5th bilaterally.  All other systems are negative.  Nails were filed thin. Laser therapy was administered to 1st, 4th and 5th toenails bilaterally and patient tolerated the treatment well. All safety precautions were in place.   Patient will proceed with previous treatment plan of 3 treatments at intervals of 6 weeks.   Follow up in 6 weeks for laser # 2 of 3.

## 2023-06-04 NOTE — Patient Instructions (Signed)

## 2023-07-05 ENCOUNTER — Ambulatory Visit: Admitting: Internal Medicine

## 2023-07-05 ENCOUNTER — Other Ambulatory Visit (HOSPITAL_COMMUNITY): Payer: Self-pay

## 2023-07-05 ENCOUNTER — Encounter: Payer: Self-pay | Admitting: Internal Medicine

## 2023-07-05 VITALS — BP 140/100 | HR 74 | Temp 98.3°F | Ht 64.0 in | Wt 193.0 lb

## 2023-07-05 DIAGNOSIS — I10 Essential (primary) hypertension: Secondary | ICD-10-CM

## 2023-07-05 DIAGNOSIS — M5416 Radiculopathy, lumbar region: Secondary | ICD-10-CM | POA: Diagnosis not present

## 2023-07-05 MED ORDER — METHYLPREDNISOLONE 4 MG PO TBPK
ORAL_TABLET | ORAL | 0 refills | Status: DC
Start: 2023-07-05 — End: 2023-09-10
  Filled 2023-07-05: qty 21, 6d supply, fill #0

## 2023-07-05 MED ORDER — TRAMADOL HCL 50 MG PO TABS
50.0000 mg | ORAL_TABLET | Freq: Two times a day (BID) | ORAL | 0 refills | Status: DC | PRN
  Filled 2023-07-05: qty 15, 4d supply, fill #0

## 2023-07-05 NOTE — Patient Instructions (Addendum)
   Take your BP medication daily.   Medications changes include :   medrol dose pak, tramadol 50-100 mg nightly as needed for pain     BP goal < 130/80   Return if symptoms worsen or fail to improve.

## 2023-07-05 NOTE — Assessment & Plan Note (Signed)
 Acute Started 2 days ago Right lower back pain radiating to right knee No previous episodes of back pain Discussed treatment options She will see the chiropractor Medrol Dosepak Discussed that we can consider gabapentin-she would like to hold off on that for now Tramadol 50-100 mg at bedtime as needed for short-term so that she is able to sleep She will let me know if there is no improvement.

## 2023-07-05 NOTE — Assessment & Plan Note (Addendum)
 Chronic Was on spironolactone  for acne - this as been stopped which has likely contributed to the increased BP Stressed taking Bystolic  10 mg daily - did not take today Advised monitoring BP at home - goal < 130/80

## 2023-07-05 NOTE — Progress Notes (Signed)
 Subjective:    Patient ID: Candace [name], female    DOB: 1980-05-30, 43 y.o.   MRN: [id number]      HPI Candace [name] is here for  Chief Complaint  Patient presents with   Back Pain    Low back pain; right groin pain with numbness noted: She is not sleeping due to pain and discomfort; Pain radiates down into knee cap     Lower back pain started 2 days ago.  She woke up ok and late morning she felt a knot in the back and was packing to go to the beach.  She had muscle spasms on that day. She went to reflexology on [address] - her back pain was worse after.  The pain was radiating to right upper buttock, right anterior thigh down to knee pain.  Right leg has given out on her. Her right upper leg has been intermittently numb.   Yesterday she went to the chiropractor and had an adjustment of her lower back - it may have helped a little.  Pain is mostly in her right groin and upper leg to the knee.  Pain today resolves with sitting   Has taken advil , heat patch, muscle ache cream.    Never had back issues previously.    She did not take her blood pressure medication today, but she did take it yesterday.  She has not been monitoring her blood pressure at home.  Medications and allergies reviewed with patient and updated if appropriate.  Current Outpatient Medications on File Prior to Visit  Medication Sig Dispense Refill   hydroquinone  4 % cream Apply topically as directed. Apply every night under breast and every other night to underarms x 3 months 28.35 g 2   lisdexamfetamine (VYVANSE ) 40 MG capsule Take 1 capsule (40 mg) by mouth every morning. 30 capsule 0   metroNIDAZOLE  (METROGEL ) 0.75 % vaginal gel Insert 1 applicatorful vaginally daily for 5 days. 70 g 0   nebivolol  (BYSTOLIC ) 10 MG tablet Take 1 tablet (10 mg total) by mouth daily. 90 tablet 3   norethindrone  (AYGESTIN ) 5 MG tablet Take 1 tablet (5 mg total) by mouth daily. 10 tablet 0   nystatin -triamcinolone  ointment (MYCOLOG)  Apply topically 2 times daily to affected areas under breast for 1 week 30 g 1   Safety Seal Miscellaneous MISC Brogaine with minoxidil usp 7% and finasteride USP 0.1% apply to the affected areas with cotton ball or qtip every morning. 30 mL 2   Tazarotene  (ARAZLO ) 0.045 % LOTN Apply 1 application  topically at bedtime. Apply pea sized amount to face at bedtime 2 nights per week x 1 month then increase to 3 nights per week. 45 g 2   tirzepatide (ZEPBOUND) 2.5 MG/0.5ML injection vial Inject 2.5 mg into the skin once a week. 2 mL 0   No current facility-administered medications on file prior to visit.    Review of Systems     Objective:   Vitals:   07/05/23 1459  BP: (!) 160/110  Pulse: 74  Temp: 98.3 F (36.8 C)  SpO2: 99%   BP Readings from Last 3 Encounters:  07/05/23 (!) 160/110  05/04/23 130/84  04/05/23 (!) 169/116   Wt Readings from Last 3 Encounters:  07/05/23 193 lb (87.5 kg)  05/04/23 193 lb (87.5 kg)  09/18/22 188 lb 3.2 oz (85.4 kg)   Body mass index is 33.13 kg/m.    Physical Exam Constitutional:      General: She  is not in acute distress.    Appearance: Normal appearance. She is not ill-appearing.  HENT:     Head: Normocephalic and atraumatic.  Musculoskeletal:        General: No swelling, tenderness (lower back) or deformity.  Skin:    General: Skin is warm and dry.     Findings: No rash.  Neurological:     Mental Status: She is alert.     Sensory: No sensory deficit.     Motor: No weakness.     Comments: Positive right leg raise            Assessment & Plan:    See Problem List for Assessment and Plan of chronic medical problems.

## 2023-07-08 ENCOUNTER — Ambulatory Visit: Payer: Commercial Managed Care - PPO | Admitting: Dermatology

## 2023-07-09 ENCOUNTER — Encounter: Payer: Self-pay | Admitting: Internal Medicine

## 2023-07-12 ENCOUNTER — Encounter: Payer: Self-pay | Admitting: Family Medicine

## 2023-07-12 ENCOUNTER — Ambulatory Visit

## 2023-07-12 ENCOUNTER — Other Ambulatory Visit (HOSPITAL_COMMUNITY): Payer: Self-pay

## 2023-07-12 ENCOUNTER — Ambulatory Visit (INDEPENDENT_AMBULATORY_CARE_PROVIDER_SITE_OTHER)

## 2023-07-12 ENCOUNTER — Telehealth: Payer: Self-pay

## 2023-07-12 ENCOUNTER — Ambulatory Visit (INDEPENDENT_AMBULATORY_CARE_PROVIDER_SITE_OTHER): Admitting: Family Medicine

## 2023-07-12 VITALS — BP 122/84 | HR 75 | Ht 64.0 in | Wt 199.0 lb

## 2023-07-12 DIAGNOSIS — R29898 Other symptoms and signs involving the musculoskeletal system: Secondary | ICD-10-CM

## 2023-07-12 DIAGNOSIS — M48061 Spinal stenosis, lumbar region without neurogenic claudication: Secondary | ICD-10-CM | POA: Diagnosis not present

## 2023-07-12 DIAGNOSIS — M5126 Other intervertebral disc displacement, lumbar region: Secondary | ICD-10-CM | POA: Diagnosis not present

## 2023-07-12 DIAGNOSIS — M545 Low back pain, unspecified: Secondary | ICD-10-CM

## 2023-07-12 DIAGNOSIS — G8929 Other chronic pain: Secondary | ICD-10-CM

## 2023-07-12 DIAGNOSIS — M5441 Lumbago with sciatica, right side: Secondary | ICD-10-CM

## 2023-07-12 DIAGNOSIS — M79604 Pain in right leg: Secondary | ICD-10-CM | POA: Diagnosis not present

## 2023-07-12 DIAGNOSIS — M5137 Other intervertebral disc degeneration, lumbosacral region with discogenic back pain only: Secondary | ICD-10-CM | POA: Diagnosis not present

## 2023-07-12 MED ORDER — GABAPENTIN 300 MG PO CAPS
300.0000 mg | ORAL_CAPSULE | Freq: Three times a day (TID) | ORAL | 3 refills | Status: DC | PRN
  Filled 2023-07-12: qty 90, 15d supply, fill #0

## 2023-07-12 MED ORDER — HYDROCODONE-ACETAMINOPHEN 5-325 MG PO TABS
1.0000 | ORAL_TABLET | Freq: Four times a day (QID) | ORAL | 0 refills | Status: AC | PRN
  Filled 2023-07-12: qty 15, 4d supply, fill #0

## 2023-07-12 NOTE — Progress Notes (Signed)
 I, Miquel Amen, CMA acting as a scribe for Garlan Juniper, MD.  Candace [name] is a 43 y.o. female who presents to [facility] at [facility] today for low back and radicular pain ongoing since the beginning of June (2 weeks). Pain started when she was packing for the beach. Pt locates pain to right side lower back, throbbing.  Now having radiating pain into the hip and thigh. Was seen at reflexology, then started having shooting pain and weakness into the leg. Also having n/t in the leg. Has been seen by Chiro x 3, no improvement. Has been sene by PCP, told that it was a pinched nerve. Now having pain radiating into the groin and medial aspect of the knee. Also feels that she is retaining fluid and lacks coordination. Sx causing night disturbance. No relief with Tramadol  or Prednisone . Sx worse in the mornings. Gets the most relief with Tiger Patch, short-term relief. Also gets short-term relief with soaking.   Radiating pain: groin, hip, R LE LE numbness/tingling: R LE LE weakness: R LE Aggravates: movement Treatments tried: Tramadol , prednisone , reflexology, chiro, heat, Lido Patch, topical analgesic.  Dx imaging: 09/23/15 L-spine XR  Pertinent review of systems: No fevers or chills.  No saddle anesthesia.  Patient does have some chronic urge incontinence but does not know any new incontinence with these increase in leg pain.  Relevant historical information: Hypertension ADHD.  Urge incontinence.   Exam:  BP 122/84   Pulse 75   Ht 5\' 4"  (1.626 m)   Wt 199 lb (90.3 kg)   SpO2 100%   BMI 34.16 kg/m  General: Well Developed, well nourished, and in no acute distress.   MSK: L-spine: Normal appearing Nontender palpation spinal midline. Decreased lumbar motion. Lower extremity strength decreased right knee extension rated 4/5.  Otherwise lower extremity strength is equal and normal bilaterally. Reflexes are diminished bilaterally but equal. Antalgic gait. Negative  slump test.  Radicular pain worse with extension.   Lab and Radiology Results  X-ray images lumbar spine obtained today personally and independently interpreted. No severe degenerative changes.  No acute fractures or malalignment. Await formal radiology review.     Assessment and Plan: 42 y.o. female with right lumbar radiculopathy at L3/L4 with resulting weakness at knee extension/quad.  Symptoms are worsening and escalating.  She is already had a trial of oral steroids and tramadol  from her PCP. Plan for stat MRI to evaluate weakness and lumbar radiculopathy.  Additionally will prescribe higher doses of gabapentin and hydrocodone  for pain control.  Expect an MRI to be done today or tomorrow and expect to arrange for an epidural steroid injection and possibly a neurosurgery consultation.   PDMP reviewed during this encounter. Orders Placed This Encounter  Procedures   DG Lumbar Spine 2-3 Views    Standing Status:   Future    Number of Occurrences:   1    Expiration Date:   08/11/2023    Reason for Exam (SYMPTOM  OR DIAGNOSIS REQUIRED):   right side low back pain    Preferred imaging location?:   [facility]    Is patient pregnant?:   No   MR Lumbar Spine Wo Contrast    Standing Status:   Future    Expiration Date:   07/11/2024    What is the patient's sedation requirement?:   No Sedation    Does the patient have a pacemaker or implanted devices?:   No    Preferred imaging  location?:   [facility] (table limit-350lbs)   Meds ordered this encounter  Medications   HYDROcodone -acetaminophen  (NORCO/VICODIN) 5-325 MG tablet    Sig: Take 1 tablet by mouth every 6 (six) hours as needed.    Dispense:  15 tablet    Refill:  0   gabapentin (NEURONTIN) 300 MG capsule    Sig: Take 1-2 capsules (300-600 mg total) by mouth 3 (three) times daily as needed.    Dispense:  90 capsule    Refill:  3     Discussed warning signs or symptoms. Please see discharge instructions.  Patient expresses understanding.   The above documentation has been reviewed and is accurate and complete Garlan Juniper, M.D.

## 2023-07-12 NOTE — Telephone Encounter (Signed)
 STAT MRI ordered to [place]

## 2023-07-12 NOTE — Patient Instructions (Addendum)
 Thank you for coming in today.    Take the gabapentin at bedtime as needed.   Use hydrocodone  as needed.   Let me know if you do not hear about that MRI in a day.

## 2023-07-13 ENCOUNTER — Encounter: Payer: Self-pay | Admitting: Family Medicine

## 2023-07-13 ENCOUNTER — Ambulatory Visit: Payer: Self-pay | Admitting: Family Medicine

## 2023-07-13 ENCOUNTER — Telehealth: Payer: Self-pay | Admitting: Family Medicine

## 2023-07-13 DIAGNOSIS — R29898 Other symptoms and signs involving the musculoskeletal system: Secondary | ICD-10-CM

## 2023-07-13 DIAGNOSIS — G8929 Other chronic pain: Secondary | ICD-10-CM

## 2023-07-13 NOTE — Progress Notes (Signed)
 Lumbar spine MRI does show a bulging disc at L4-5 pressing on the nerve root.  Plan for epidural steroid injection.  You should hear from [facility] imaging soon about scheduling this injection.  Please let me know how you feel after the injection.

## 2023-07-13 NOTE — Telephone Encounter (Signed)
 Epidural steroid injection ordered

## 2023-07-14 ENCOUNTER — Telehealth: Payer: Self-pay

## 2023-07-14 DIAGNOSIS — R29898 Other symptoms and signs involving the musculoskeletal system: Secondary | ICD-10-CM

## 2023-07-14 DIAGNOSIS — G8929 Other chronic pain: Secondary | ICD-10-CM

## 2023-07-14 NOTE — Telephone Encounter (Signed)
 Dear Dr. Alease Hunter,   Since May 31, I have been unable to work due to ongoing symptoms, including lower back pain, numbness, muscle weakness, and radiating pain in my right leg.   [employer] has faxed the necessary FMLA forms to your office. If possible, could you assist with completing the forms and include my current symptoms and any diagnosis made to date?   Please let me know if you need anything further from me. Thank you for your support.   Warm regards, Candace [name]

## 2023-07-14 NOTE — Discharge Instructions (Signed)

## 2023-07-15 ENCOUNTER — Ambulatory Visit
Admission: RE | Admit: 2023-07-15 | Discharge: 2023-07-15 | Disposition: A | Discharge status: Home / Self Care | Source: Ambulatory Visit | Attending: Family Medicine | Admitting: Family Medicine

## 2023-07-15 DIAGNOSIS — M5116 Intervertebral disc disorders with radiculopathy, lumbar region: Secondary | ICD-10-CM | POA: Diagnosis not present

## 2023-07-15 DIAGNOSIS — G8929 Other chronic pain: Secondary | ICD-10-CM

## 2023-07-15 DIAGNOSIS — R29898 Other symptoms and signs involving the musculoskeletal system: Secondary | ICD-10-CM

## 2023-07-15 MED ORDER — IOPAMIDOL (ISOVUE-M 200) INJECTION 41%
1.0000 mL | Freq: Once | INTRAMUSCULAR | Status: AC
  Administered 2023-07-15: 1 mL via EPIDURAL

## 2023-07-15 MED ORDER — METHYLPREDNISOLONE ACETATE 40 MG/ML INJ SUSP (RADIOLOG
80.0000 mg | Freq: Once | INTRAMUSCULAR | Status: AC
  Administered 2023-07-15: 80 mg via EPIDURAL

## 2023-07-16 NOTE — Progress Notes (Signed)
Low back x-ray looks normal to radiology.

## 2023-07-16 NOTE — Telephone Encounter (Signed)
 Forms received from [employer].

## 2023-07-19 NOTE — Telephone Encounter (Signed)
 Forwarding to Dr. Alease Hunter to review and advise regarding PT.

## 2023-07-20 NOTE — Telephone Encounter (Signed)
 Form reviewed and signed by Dr. Denyse Amass, placed at the front desk for faxing/scanning.

## 2023-07-22 NOTE — Telephone Encounter (Signed)
 Referral placed for PT and consult with Neuro Surg.    Syliva Even, MD to [facility]     07/20/23  7:22 AM I see that you had an epidural steroid injection on the 12th.  Are you feeling any better?  If not please let me know.  I will refer to physical therapy but also it may be reasonable to start talking to a surgeon.  Last read by Hester Lot at 7:09AM on 07/22/2023.

## 2023-07-22 NOTE — Addendum Note (Signed)
 Addended by: Charle Congo on: 07/22/2023 04:02 PM   Modules accepted: Orders

## 2023-07-23 DIAGNOSIS — Z0279 Encounter for issue of other medical certificate: Secondary | ICD-10-CM

## 2023-07-23 NOTE — Telephone Encounter (Signed)
 Called and spoke with patient, she will review the forms after she arrives home and let me know if we are OK to go ahead and fax.

## 2023-07-23 NOTE — Telephone Encounter (Signed)
 Form completed, placed on Dr. Zollie Pee desk to review and sign.

## 2023-07-26 ENCOUNTER — Ambulatory Visit: Admitting: Dermatology

## 2023-07-26 VITALS — BP 128/96

## 2023-07-26 DIAGNOSIS — L83 Acanthosis nigricans: Secondary | ICD-10-CM

## 2023-07-26 DIAGNOSIS — L659 Nonscarring hair loss, unspecified: Secondary | ICD-10-CM

## 2023-07-26 DIAGNOSIS — L700 Acne vulgaris: Secondary | ICD-10-CM

## 2023-07-26 DIAGNOSIS — L709 Acne, unspecified: Secondary | ICD-10-CM

## 2023-07-26 MED ORDER — ARAZLO 0.045 % EX LOTN: 1.0000 | TOPICAL_LOTION | Freq: Every day | CUTANEOUS | 2 refills | Status: AC

## 2023-07-26 NOTE — Telephone Encounter (Signed)
 Form updated, placed at the front desk for faxing/scanning.

## 2023-07-26 NOTE — Patient Instructions (Addendum)
 Date: Mon Jul 26 2023  Hello Ms. Abran,  Thank you for visiting today. Here is a summary of the key instructions:  - Hair Care:   - Loosen braids to reduce tension and inflammation   - Use a cotton ball to apply hair treatments   - Consider using an electric facial trimmer for excess hair   - Continue using Viviscal   - Add collagen supplements for stronger hair  - Skin Care:   - Continue using tretinoin and Arazlo    - Apply sunscreen daily   - Apply nystatin /triamcinolone  for intertrigo for up to 10 days  - Follow-up:   - Schedule a follow-up appointment in 6 months   Please reach out if you have any questions or concerns.  Warm regards,  Dr. Delon Lenis, Dermatology            Recommended Sunscreen

## 2023-07-26 NOTE — Progress Notes (Unsigned)
   Follow-Up Visit   Subjective  Candace [name] is a 43 y.o. female who presents for the following: Androgenetic alopecia/Telogen Effluvium follow up - She is using Brogaine solution daily - usually at night. She is taking Viviscal and prenatal vitamins. She is seeing hair growth of her sideburns - right side seems to be thicker. She is also following up on acne and acanthosis nigracans. She is using Arazlo  3 times per week and Melaxemic cream 2 times per week.    The following portions of the chart were reviewed this encounter and updated as appropriate: medications, allergies, medical history  Review of Systems:  No other skin or systemic complaints except as noted in HPI or Assessment and Plan.  Objective  Well appearing patient in no apparent distress; mood and affect are within normal limits.   A focused examination was performed of the following areas:    Relevant exam findings are noted in the Assessment and Plan.          Assessment & Plan   ANDROGENETIC ALOPECIA (FEMALE PATTERN HAIR LOSS) Exam: Diffuse thinning of the crown and widening of the midline part with retention of the frontal hairline    Treatment Plan: Improving.  Continue Viviscal daily. Start Vital Proteins collagen supplements.  Continue Brogaine daily. Recommend applying with a cotton ball.   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   ACNE VULGARIS   Related Medications Tazarotene  (ARAZLO ) 0.045 % LOTN Apply 1 application  topically at bedtime. Apply pea sized amount to face at bedtime 2 nights per week x 1 month then increase to 3 nights per  week.  Return in about 6 months (around 01/25/2024) for Alopecia.  I, Roseline Hutchinson, CMA, am acting as scribe for [employer], DO .   Documentation: I have reviewed the above documentation for accuracy and completeness, and I agree with the above.  Delon Lenis, DO

## 2023-07-29 ENCOUNTER — Encounter: Payer: Self-pay | Admitting: Dermatology

## 2023-07-30 ENCOUNTER — Ambulatory Visit (INDEPENDENT_AMBULATORY_CARE_PROVIDER_SITE_OTHER): Admitting: *Deleted

## 2023-07-30 DIAGNOSIS — B351 Tinea unguium: Secondary | ICD-10-CM

## 2023-07-30 NOTE — Progress Notes (Signed)
 Patient presents today for the 2nd laser treatment. Diagnosed with mycotic nail infection by Dr. Magdalen.   Toenail most affected 1st, 4th and 5th bilaterally. The only nail that appears to have fungal growth is the hallux left. The other nails look better.  All other systems are negative.  Nails were filed thin. Laser therapy was administered to 1st, 4th and 5th toenails bilaterally and patient tolerated the treatment well. All safety precautions were in place.   Patient will proceed with previous treatment plan of 3 treatments at intervals of 6 weeks.   Follow up in 6 weeks for laser # 3 of 3.

## 2023-08-09 ENCOUNTER — Other Ambulatory Visit: Payer: Self-pay | Admitting: Internal Medicine

## 2023-08-10 NOTE — Therapy (Signed)
 OUTPATIENT PHYSICAL THERAPY THORACOLUMBAR EVALUATION  Patient Name: Candace [name] MRN: [id number] DOB:[birth year], 43 y.o., female Today's Date: 08/11/2023   PT End of Session - 08/11/23 1535     Visit Number 1    Number of Visits --   1-2x/week   Date for PT Re-Evaluation 10/06/23    Authorization Type [employer]    PT Start Time 1337    PT Stop Time 1415    PT Time Calculation (min) 38 min          Past Medical History:  Diagnosis Date   Anxiety    no meds   Depression    no meds   Hypertension    does not take BP med regularly, instructed to take med nest 3 days.   Past Surgical History:  Procedure Laterality Date   DILATION AND CURETTAGE OF UTERUS     x 2 polyps removed   DILATION AND EVACUATION N/A 02/10/2013   Procedure: DILATATION AND EVACUATION;  Surgeon: Truman Corona, MD;  Location: [facility];  Service: Gynecology;  Laterality: N/A;  with intra operative us    WISDOM TOOTH EXTRACTION  03/05/2014   Patient Active Problem List   Diagnosis Date Noted   Lumbar radiculopathy, acute 07/05/2023   Acne 03/19/2022   Urge incontinence 03/28/2021   Female infertility 07/14/2019   Polycystic ovary syndrome 07/14/2019   ADD (attention deficit disorder) 06/22/2018   Prediabetes 03/23/2017   OSA (obstructive sleep apnea) 01/08/2017   Obesity 06/26/2016   Vitamin D  deficiency 08/12/2015   Alopecia 06/11/2014   Multinodular thyroid  12/01/2011   Cervical polyp 11/26/2011   Eczema 11/26/2011   Essential hypertension, benign 11/25/2011    PCP: Geofm Glade PARAS, MD  REFERRING PROVIDER: Joane Artist RAMAN, MD  THERAPY DIAG:  Other low back pain - Plan: PT plan of care cert/re-cert  Muscle weakness - Plan: PT plan of care cert/re-cert  Other abnormalities of gait and mobility - Plan: PT plan of care cert/re-cert  REFERRING DIAG: Chronic right-sided low back pain with right-sided sciatica [M54.41, G89.29], Right leg weakness [R29.898]  Rationale for Evaluation and Treatment:   Rehabilitation  SUBJECTIVE:  PERTINENT PAST HISTORY:  ADD, R sided disc extrusion L4/L5 with ESI       PRECAUTIONS: Fall  WEIGHT BEARING RESTRICTIONS No  FALLS:  Has patient fallen in last 6 months? Yes, Number of falls: multiple falls following original injury d/t R leg weakness  MOI/History of condition:  Onset date: End of May  SUBJECTIVE STATEMENT  Candace [name] is a 43 y.o. female who presents to clinic with chief complaint of low back and LE pain after she was packing to go to the beach.  No heavy lifting.  She had sudden R radicular pain and weakness.  She has gone to a chiro and reflexology which was not helpful.  She had and MRI showing large disk extrusion at L4-L5.  She had an ESI which was helpful.  She continues to endorse some weakness of R LE with some numbness.  She does feel that it is improving with time.   Pain:  Are you having pain? 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  GENERAL OBSERVATION/GAIT: Reduced time in R stance   LE MMT:  MMT Right (Eval) Left (Eval)  Hip flexion (L2, L3) 3+ 4+  Knee extension (L3) 3+ 4+  Knee flexion    Hip abduction    Hip extension    Hip external rotation    Hip internal rotation    Hip adduction    Ankle dorsiflexion (L4) c c  Ankle plantarflexion (S1) c c  Ankle inversion    Ankle eversion    Great Toe ext (L5) c c  Grossly     (Blank rows = not tested, score listed is out of 5 possible points.  N = WNL, D =  diminished, C = clear for gross weakness with myotome testing, * = concordant pain with testing)  SPECIAL TESTS:  Straight leg raise: L (-), R (-) Slump: L (-), R (-)   LE ROM:  ROM Right (Eval) Left (Eval)  Hip flexion    Hip extension    Hip abduction    Hip adduction    Hip internal rotation    Hip external rotation    Knee flexion    Knee extension    Ankle dorsiflexion    Ankle plantarflexion    Ankle inversion    Ankle eversion      (Blank rows = not tested, N = WNL, * = concordant pain with testing)  Functional Tests  Eval                                                              PATIENT SURVEYS:  LEFS: 39/80  TODAY'S TREATMENT  Therapeutic Exercise: Creating, reviewing, and completing below HEP  PATIENT EDUCATION (Vincent/HM):  POC, diagnosis, prognosis, HEP, and outcome measures.  Pt educated via explanation, demonstration, and handout (HEP).  Pt confirms understanding verbally.   HOME EXERCISE PROGRAM: Access Code: [email or web address] URL: [email or web address] Date: 08/11/2023 Prepared by: Helene Gasmen  Exercises - Prone Press Up  - 2-5 x daily - 7 x weekly - 2 sets - 10 reps - Seated Sciatic Tensioner  - 2 x daily - 7 x weekly - 20 reps  Treatment priorities   Eval        Ext based?        Increase activity as tolerated                                   ASSESSMENT:  CLINICAL IMPRESSION: Candace [name] is a 43 y.o. female who presents to clinic with signs and sxs consistent with R sided radiculopathy.  MRI confirmed disk extrusion at L4/L5 on R.  Continues to show strength deficit R LE.  Overall improving significantly since onset and ESI.   Candace [name] will benefit from skilled PT to address relevant deficits and improve LE strength and function in order to complete daily tasks and return to work and recreation.   OBJECTIVE IMPAIRMENTS: Pain, neural tension, LE strength, gait  ACTIVITY LIMITATIONS: standing, working, lifting,  walking  PERSONAL FACTORS: See medical history and pertinent history   REHAB POTENTIAL: Good  CLINICAL DECISION MAKING: Evolving/moderate complexity  EVALUATION COMPLEXITY: Moderate   GOALS:   SHORT  TERM GOALS: Target date: 09/08/2023   Candace [name] will be >75% HEP compliant to improve carryover between sessions and facilitate independent management of condition  Evaluation: ongoing Goal status: INITIAL   LONG TERM GOALS: Target date: 10/06/2023   Candace [name] will self report >/= 50% decrease in pain from evaluation to improve function in daily tasks  Evaluation/Baseline: 5/10 max pain Goal status: INITIAL   2.  Aftin will show a >/= 18 pt improvement in LEFS score (MCID is ~11% or 9 pts) as a proxy for functional improvement   Evaluation/Baseline: 39 pts Goal status: INITIAL   3.  Candace [name] will be able to , not limited by pain  Evaluation/Baseline: limited Goal status: INITIAL   4.  Candace [name] will report confidence in self management of condition at time of discharge with advanced HEP  Evaluation/Baseline: unable to self manage Goal status: INITIAL  5.  Candace [name] will demonstrate roughly symmetrical LE strength testing   Evaluation/Baseline: asymmetric  Goal status: INITIAL   PLAN: PT FREQUENCY: 1-2x/week  PT DURATION: 8 weeks  PLANNED INTERVENTIONS:  97164- PT Re-evaluation, 97110-Therapeutic exercises, 97530- Therapeutic activity, [id number]- Neuromuscular re-education, 97535- Self Care, 02859- Manual therapy, [id number]- Gait training, [id number]- Aquatic Therapy, [phone]- Electrical stimulation (manual), [id number]- Vasopneumatic device, [id number]- Traction (mechanical), [id number]- Ionotophoresis 4mg /ml Dexamethasone , Taping, Dry Needling, Joint manipulation, and Spinal manipulation.   Zeth Buday PT, DPT 08/11/2023, 3:38 PM

## 2023-08-11 ENCOUNTER — Other Ambulatory Visit: Payer: Self-pay

## 2023-08-11 ENCOUNTER — Encounter: Payer: Self-pay | Admitting: Physical Therapy

## 2023-08-11 ENCOUNTER — Ambulatory Visit: Attending: Family Medicine | Admitting: Physical Therapy

## 2023-08-11 DIAGNOSIS — R29898 Other symptoms and signs involving the musculoskeletal system: Secondary | ICD-10-CM | POA: Insufficient documentation

## 2023-08-11 DIAGNOSIS — M6281 Muscle weakness (generalized): Secondary | ICD-10-CM | POA: Insufficient documentation

## 2023-08-11 DIAGNOSIS — M5441 Lumbago with sciatica, right side: Secondary | ICD-10-CM | POA: Diagnosis not present

## 2023-08-11 DIAGNOSIS — G8929 Other chronic pain: Secondary | ICD-10-CM | POA: Diagnosis not present

## 2023-08-11 DIAGNOSIS — M5459 Other low back pain: Secondary | ICD-10-CM | POA: Diagnosis not present

## 2023-08-11 DIAGNOSIS — R2689 Other abnormalities of gait and mobility: Secondary | ICD-10-CM | POA: Diagnosis not present

## 2023-08-11 NOTE — Progress Notes (Deleted)
 Referring Physician:  Geofm Glade PARAS, MD [address] [place],  [place] [address]  Primary Physician:  Candace Glade PARAS, MD  History of Present Illness: 08/11/2023 [name] is here today with a chief complaint of ***  Right side low back pain with sciatica  Duration: *** Location: *** Quality: *** Severity: ***  Precipitating: aggravated by *** Modifying factors: made better by *** Weakness: none Timing: *** Bowel/Bladder Dysfunction: none  Conservative measures:  Physical therapy:  had an initial consult on 08/11/2023. Multimodal medical therapy including regular antiinflammatories: Hydrocodone , Gabapentin   Injections: 07/15/2023 R sided disc extrusion L4/L5 with ESI   Past Surgery: ***none  Candace [name] has ***no symptoms of cervical myelopathy.  The symptoms are causing a significant impact on the patient's life.   I have utilized the care everywhere function in epic to review the outside records available from external health systems.  Review of Systems:  A 10 point review of systems is negative, except for the pertinent positives and negatives detailed in the HPI.  Past Medical History: Past Medical History:  Diagnosis Date   Anxiety    no meds   Depression    no meds   Hypertension    does not take BP med regularly, instructed to take med nest 3 days.    Past Surgical History: Past Surgical History:  Procedure Laterality Date   DILATION AND CURETTAGE OF UTERUS     x 2 polyps removed   DILATION AND EVACUATION N/A 02/10/2013   Procedure: DILATATION AND EVACUATION;  Surgeon: Candace Corona, MD;  Location: [facility];  Service: Gynecology;  Laterality: N/A;  with intra operative us    WISDOM TOOTH EXTRACTION  03/05/2014    Allergies: Allergies as of 08/16/2023 - Review Complete 08/11/2023  Allergen Reaction Noted   Amlodipine   11/20/2016    Medications:  Current Outpatient Medications:    gabapentin  (NEURONTIN ) 300 MG capsule, Take 1-2  capsules (300-600 mg total) by mouth 3 (three) times daily as needed., Disp: 90 capsule, Rfl: 3   HYDROcodone -acetaminophen  (NORCO/VICODIN) 5-325 MG tablet, Take 1 tablet by mouth every 6 (six) hours as needed., Disp: 15 tablet, Rfl: 0   hydroquinone  4 % cream, Apply topically as directed. Apply every night under breast and every other night to underarms x 3 months, Disp: 28.35 g, Rfl: 2   lisdexamfetamine (VYVANSE ) 40 MG capsule, Take 1 capsule (40 mg) by mouth every morning., Disp: 30 capsule, Rfl: 0   methylPREDNISolone  (MEDROL  DOSEPAK) 4 MG TBPK tablet, Taper as directed on package, Disp: 21 tablet, Rfl: 0   metroNIDAZOLE  (METROGEL ) 0.75 % vaginal gel, Insert 1 applicatorful vaginally daily for 5 days., Disp: 70 g, Rfl: 0   nebivolol  (BYSTOLIC ) 10 MG tablet, Take 1 tablet (10 mg total) by mouth daily., Disp: 90 tablet, Rfl: 3   norethindrone  (AYGESTIN ) 5 MG tablet, Take 1 tablet (5 mg total) by mouth daily., Disp: 10 tablet, Rfl: 0   nystatin -triamcinolone  ointment (MYCOLOG), Apply topically 2 times daily to affected areas under breast for 1 week, Disp: 30 g, Rfl: 1   Safety Seal Miscellaneous MISC, Brogaine with minoxidil usp 7% and finasteride USP 0.1% apply to the affected areas with cotton ball or qtip every morning., Disp: 30 mL, Rfl: 2   Tazarotene  (ARAZLO ) 0.045 % LOTN, Apply 1 application  topically at bedtime. Apply pea sized amount to face at bedtime 2 nights per week x 1 month then increase to 3 nights per week., Disp: 45 g,  Rfl: 2   ZEPBOUND  2.5 MG/0.5ML injection vial, INJECT 0.5 ML (2.5 MG) UNDER THE SKIN ONCE WEEKLY (0.5ML= 50 UNITS), Disp: 2 mL, Rfl: 0  Social History: Social History   Tobacco Use   Smoking status: Never   Smokeless tobacco: Never  Vaping Use   Vaping status: Never Used  Substance Use Topics   Alcohol use: Not Currently    Alcohol/week: 3.0 standard drinks of alcohol    Types: 3 Standard drinks or equivalent per week    Comment: weekends - mixed drinks    Drug use: No    Family Medical History: Family History  Problem Relation Age of Onset   Diabetes Father    Hypertension Father    Hypertension Sister    Hypertension Brother    Diabetes Maternal Grandmother    Hypertension Maternal Grandmother     Physical Examination: There were no vitals filed for this visit.  General: Patient is in no apparent distress. Attention to examination is appropriate.  Neck:   Supple.  Full range of motion.  Respiratory: Patient is breathing without any difficulty.   NEUROLOGICAL:     Awake, alert, oriented to person, place, and time.  Speech is clear and fluent.   Cranial Nerves: Pupils equal round and reactive to light.  Facial tone is symmetric.  Facial sensation is symmetric. Shoulder shrug is symmetric. Tongue protrusion is midline.    Strength: Side Biceps Triceps Deltoid Interossei Grip Wrist Ext. Wrist Flex.  R 5 5 5 5 5 5 5   L 5 5 5 5 5 5 5    Side Iliopsoas Quads Hamstring PF DF EHL  R 5 5 5 5 5 5   L 5 5 5 5 5 5    Reflexes are ***2+ and symmetric at the biceps, triceps, brachioradialis, patella and achilles.   Hoffman's is absent. Clonus is absent  Bilateral upper and lower extremity sensation is intact to light touch ***.     No evidence of dysmetria noted.  Gait is normal.    Imaging: *** I have personally reviewed the images and agree with the above interpretation.  Medical Decision Making/Assessment and Plan: [name] is a pleasant 43 y.o. female with ***  There are no diagnoses linked to this encounter.   Thank you for involving me in the care of this patient.    Candace MICAEL Sharps MD/MSCR Neurosurgery

## 2023-08-16 ENCOUNTER — Ambulatory Visit: Admitting: Neurosurgery

## 2023-08-18 NOTE — Therapy (Signed)
 OUTPATIENT PHYSICAL THERAPY THORACOLUMBAR TREATMENT  Patient Name: Candace [name] MRN: [id number] DOB:04-01-1980, 43 y.o., female Today's Date: 08/19/2023   PT End of Session - 08/19/23 1022     Visit Number 1    Date for PT Re-Evaluation 10/06/23    Authorization Type [employer]et[employer]    PT Start Time 1020    PT Stop Time 1105    PT Time Calculation (min) 45 min    Activity Tolerance Patient tolerated treatment well    Behavior During Therapy WFL for tasks assessed/performed           Past Medical History:  Diagnosis Date   Anxiety    no meds   Depression    no meds   Hypertension    does not take BP med regularly, instructed to take med nest 3 days.   Past Surgical History:  Procedure Laterality Date   DILATION AND CURETTAGE OF UTERUS     x 2 polyps removed   DILATION AND EVACUATION N/A 02/10/2013   Procedure: DILATATION AND EVACUATION;  Surgeon: Truman Corona, MD;  Location: [facility];  Service: Gynecology;  Laterality: N/A;  with intra operative us    WISDOM TOOTH EXTRACTION  03/05/2014   Patient Active Problem List   Diagnosis Date Noted   Lumbar radiculopathy, acute 07/05/2023   Acne 03/19/2022   Urge incontinence 03/28/2021   Female infertility 07/14/2019   Polycystic ovary syndrome 07/14/2019   ADD (attention deficit disorder) 06/22/2018   Prediabetes 03/23/2017   OSA (obstructive sleep apnea) 01/08/2017   Obesity 06/26/2016   Vitamin D  deficiency 08/12/2015   Alopecia 06/11/2014   Multinodular thyroid  12/01/2011   Cervical polyp 11/26/2011   Eczema 11/26/2011   Essential hypertension, benign 11/25/2011    PCP: Geofm Glade PARAS, MD  REFERRING PROVIDER: Joane Artist RAMAN, MD  THERAPY DIAG:  Other low back pain  Muscle weakness  Other abnormalities of gait and mobility  REFERRING DIAG: Chronic right-sided low back pain with right-sided sciatica [M54.41, G89.29], Right leg weakness [R29.898]  Rationale for Evaluation and Treatment:   Rehabilitation  SUBJECTIVE:  PERTINENT PAST HISTORY:  ADD, R sided disc extrusion L4/L5 with ESI       PRECAUTIONS: Fall  WEIGHT BEARING RESTRICTIONS No  FALLS:  Has patient fallen in last 6 months? 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 I have doing the exercises and I still have tightening in my muscles  especially in the morning. My pain goes down by 12 noon on medicine. I am a 5/10 pain.  Down to a 3/10 at the end of the session.    EVAL-Candace [name] is a 43 y.o. female who presents to clinic with chief complaint of low back and LE pain after she was packing to go to the beach.  No heavy lifting.  She had sudden R radicular pain and weakness.  She has gone to a chiro and reflexology which was not helpful.  She had and MRI showing large disk extrusion at L4-L5.  She had an ESI which was helpful.  She continues to endorse some weakness of R LE with some numbness.  She does feel that it is improving with time.   Pain:  Are you having pain? 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  GENERAL OBSERVATION/GAIT: Reduced time in R stance   LE MMT:  MMT Right (Eval) Left (Eval)  Hip flexion (L2, L3) 3+ 4+  Knee extension (L3) 3+ 4+  Knee flexion    Hip abduction    Hip extension    Hip external rotation    Hip internal  rotation    Hip adduction    Ankle dorsiflexion (L4) c c  Ankle plantarflexion (S1) c c  Ankle inversion    Ankle eversion    Great Toe ext (L5) c c  Grossly     (Blank rows = not tested, score listed is out of 5 possible points.  N = WNL, D = diminished, C = clear for gross weakness with myotome testing, * = concordant pain with testing)  SPECIAL TESTS:  Straight leg raise: L (-), R (-) Slump: L (-), R (-)   LE ROM:  ROM Right (Eval) Left (Eval)  Hip flexion    Hip extension    Hip abduction    Hip adduction    Hip internal rotation    Hip external rotation    Knee flexion    Knee extension    Ankle dorsiflexion    Ankle plantarflexion    Ankle inversion    Ankle eversion      (Blank rows = not tested, N = WNL, * = concordant pain with testing)  Functional Tests  Eval                                                              PATIENT SURVEYS:  LEFS: 39/80  TODAY'S TREATMENT  [facility] Adult PT Treatment:                                                DATE: 08-19-23 Therapeutic Exercise: Prone Press Up  2 x 10  VC for correct execution Prone Hip extension 2 x 10 R and L Seated Sciatic Tensioner   Supine Transversus Abdominis Bracing - Hands on Thighs 2 x 10 with 5 sec hold Standing Lumbar Extension  2 x 10 1014.4 ft Manual Therapy: PA mobs of L2 to L5 and STW work of Right low back LAD of R LE with relief of pain Self Care: Posture and body mechanics with return demo Explanation of sitting and standing posture and use of pillows for comfort at night. EVAL Therapeutic Exercise: Creating, reviewing, and completing below HEP  PATIENT EDUCATION (Pe[place]/HM):  POC, diagnosis, prognosis, HEP, and outcome measures.  Pt educated via explanation, demonstration, and handout (HEP).  Pt confirms understanding verbally.   HOME EXERCISE PROGRAM: Access Code: [email or web address] URL: [email or web address] Date: 08/11/2023 Prepared by: Helene Gasmen  Exercises - Prone Press Up  - 2-5 x daily - 7 x weekly - 2 sets - 10 reps - Seated Sciatic Tensioner  -  2 x daily - 7 x weekly - 20 reps Added 08-18-23 - Prone Hip Extension  - 1 x daily - 7 x weekly - 2 sets - 10 reps - 5 hold - Supine Transversus Abdominis Bracing - Hands on Thighs  - 1 x daily - 7 x weekly - 2 sets - 10 reps - 5sec hold - Standing Lumbar Extension  - 3 x daily - 7 x weekly - 1 sets - 10 reps - 5 hold    Treatment priorities   Eval 08-19-23       Ext based?        Increase activity as tolerated         1014.4 ft                         ASSESSMENT:  CLINICAL IMPRESSION: Pt enters clinic with 5/10 pain.  Pt educated and reinforced HEP with additions of some exercises as well as educated on posture and body mechanics. Pt also received some manual therapy and had 3/10 pain at end of session. Pt would like to go to aquatics for some sessions.  At end of session pt did tell PT she also had pain in left side of back for a couple of day.  Will assess next session and Plan aquatic visit  EVAL- Cleda is a 43 y.o. female who presents to clinic with signs and sxs consistent with R sided radiculopathy.  MRI confirmed disk extrusion at L4/L5 on R.  Continues to show strength deficit R LE.  Overall improving significantly since onset and ESI.   Yesha will benefit from skilled PT to address relevant deficits and improve LE strength and function in order to complete daily tasks and return to work and recreation.   OBJECTIVE IMPAIRMENTS: Pain, neural tension, LE strength, gait  ACTIVITY LIMITATIONS: standing, working, lifting, walking  PERSONAL FACTORS: See medical history and pertinent history   REHAB POTENTIAL: Good  CLINICAL DECISION MAKING: Evolving/moderate complexity  EVALUATION COMPLEXITY: Moderate   GOALS:   SHORT TERM GOALS: Target date: 09/08/2023   Selina will be >75% HEP compliant to improve carryover between sessions and facilitate  independent management of condition  Evaluation: ongoing Goal status: INITIAL   LONG TERM GOALS: Target date: 10/06/2023   Imanii will self report >/= 50% decrease in pain from evaluation to improve function in daily tasks  Evaluation/Baseline: 5/10 max pain Goal status: INITIAL   2.  Eyleen will show a >/= 18 pt improvement in LEFS score (MCID is ~11% or 9 pts) as a proxy for functional improvement   Evaluation/Baseline: 39 pts Goal status: INITIAL   3.  Courtlynn will be able to , not limited by pain  Evaluation/Baseline: limited Goal status: INITIAL   4.  Delene will report confidence in self management of condition at time of discharge with advanced HEP  Evaluation/Baseline: unable to self manage Goal status: INITIAL  5.  Jannette will demonstrate roughly symmetrical LE strength testing   Evaluation/Baseline: asymmetric  Goal status: INITIAL   PLAN: PT FREQUENCY: 1-2x/week  PT DURATION: 8 weeks  PLANNED INTERVENTIONS:  97164- PT Re-evaluation, 97110-Therapeutic exercises, 97530- Therapeutic activity, [id number]- Neuromuscular re-education, 97535- Self Care, 02859- Manual therapy, [id number]- Gait training, [id number]- Aquatic Therapy, [phone]- Electrical stimulation (manual), [id number]- Vasopneumatic device, [id number]- Traction (mechanical), [id number]- Ionotophoresis 4mg /ml Dexamethasone , Taping, Dry Needling, Joint manipulation, and Spinal manipulation.   Graydon Dingwall, PT, [facility] Certified Exercise Expert for the Aging Adult  08/19/23 11:13  AM Phone: [phone] Fax: [phone]

## 2023-08-19 ENCOUNTER — Ambulatory Visit: Admitting: Physical Therapy

## 2023-08-19 ENCOUNTER — Encounter: Payer: Self-pay | Admitting: Physical Therapy

## 2023-08-19 DIAGNOSIS — R29898 Other symptoms and signs involving the musculoskeletal system: Secondary | ICD-10-CM | POA: Diagnosis not present

## 2023-08-19 DIAGNOSIS — M5459 Other low back pain: Secondary | ICD-10-CM | POA: Diagnosis not present

## 2023-08-19 DIAGNOSIS — G8929 Other chronic pain: Secondary | ICD-10-CM | POA: Diagnosis not present

## 2023-08-19 DIAGNOSIS — M5441 Lumbago with sciatica, right side: Secondary | ICD-10-CM | POA: Diagnosis not present

## 2023-08-19 DIAGNOSIS — R2689 Other abnormalities of gait and mobility: Secondary | ICD-10-CM | POA: Diagnosis not present

## 2023-08-19 DIAGNOSIS — M6281 Muscle weakness (generalized): Secondary | ICD-10-CM

## 2023-08-19 NOTE — Patient Instructions (Signed)
 Sleeping on Back  Place pillow under knees. A pillow with cervical support and a roll around waist are also helpful. Copyright  VHI. All rights reserved.  Sleeping on Side Place pillow between knees. Use cervical support under neck and a roll around waist as needed. Copyright  VHI. All rights reserved.   Sleeping on Stomach   If this is the only desirable sleeping position, place pillow under lower legs, and under stomach or chest as needed.  Posture - Sitting   Sit upright, head facing forward. Try using a roll to support lower back. Keep shoulders relaxed, and avoid rounded back. Keep hips level with knees. Avoid crossing legs for long periods. Stand to Sit / Sit to Stand   To sit: Bend knees to lower self onto front edge of chair, then scoot back on seat. To stand: Reverse sequence by placing one foot forward, and scoot to front of seat. Use rocking motion to stand up.   Work Height and Reach  Ideal work height is no more than 2 to 4 inches below elbow level when standing, and at elbow level when sitting. Reaching should be limited to arm's length, with elbows slightly bent.  Bending  Bend at hips and knees, not back. Keep feet shoulder-width apart.    Posture - Standing   Good posture is important. Avoid slouching and forward head thrust. Maintain curve in low back and align ears over shoul- ders, hips over ankles.  Alternating Positions   Alternate tasks and change positions frequently to reduce fatigue and muscle tension. Take rest breaks. Computer Work   Position work to Art gallery manager. Use proper work and seat height. Keep shoulders back and down, wrists straight, and elbows at right angles. Use chair that provides full back support. Add footrest and lumbar roll as needed.  Getting Into / Out of Car  Lower self onto seat, scoot back, then bring in one leg at a time. Reverse sequence to get out.  Dressing  Lie on back to pull socks or slacks over feet, or sit  and bend leg while keeping back straight.    Housework - Sink  Place one foot on ledge of cabinet under sink when standing at sink for prolonged periods.   Pushing / Pulling  Pushing is preferable to pulling. Keep back in proper alignment, and use leg muscles to do the work.  Deep Squat   Squat and lift with both arms held against upper trunk. Tighten stomach muscles without holding breath. Use smooth movements to avoid jerking.  Avoid Twisting   Avoid twisting or bending back. Pivot around using foot movements, and bend at knees if needed when reaching for articles.  Carrying Luggage   Distribute weight evenly on both sides. Use a cart whenever possible. Do not twist trunk. Move body as a unit.   Lifting Principles Maintain proper posture and head alignment. Slide object as close as possible before lifting. Move obstacles out of the way. Test before lifting; ask for help if too heavy. Tighten stomach muscles without holding breath. Use smooth movements; do not jerk. Use legs to do the work, and pivot with feet. Distribute the work load symmetrically and close to the center of trunk. Push instead of pull whenever possible.   Ask For Help   Ask for help and delegate to others when possible. Coordinate your movements when lifting together, and maintain the low back curve.  Log Roll   Lying on back, bend left knee and place left  arm across chest. Roll all in one movement to the right. Reverse to roll to the left. Always move as one unit. Housework - Sweeping  Use long-handled equipment to avoid stooping.   Housework - Wiping  Position yourself as close as possible to reach work surface. Avoid straining your back.  Laundry - Unloading Wash   To unload small items at bottom of washer, lift leg opposite to arm being used to reach.  Gardening - Raking  Move close to area to be raked. Use arm movements to do the work. Keep back straight and avoid twisting.      Cart  When reaching into cart with one arm, lift opposite leg to keep back straight.   Getting Into / Out of Bed  Lower self to lie down on one side by raising legs and lowering head at the same time. Use arms to assist moving without twisting. Bend both knees to roll onto back if desired. To sit up, start from lying on side, and use same move-ments in reverse. Housework - Vacuuming  Hold the vacuum with arm held at side. Step back and forth to move it, keeping head up. Avoid twisting.   Laundry - Armed forces training and education officer so that bending and twisting can be avoided.   Laundry - Unloading Dryer  Squat down to reach into clothes dryer or use a reacher.  Gardening - Weeding / Psychiatric nurse or Kneel. Knee pads may be helpful.                    Posture Tips DO: - stand tall and erect - keep chin tucked in - keep head and shoulders in alignment - check posture regularly in mirror or large window - pull head back against headrest in car seat;  Change your position often.  Sit with lumbar support. DON'T: - slouch or slump while watching TV or reading - sit, stand or lie in one position  for too long;  Sitting is especially hard on the spine so if you sit at a desk/use the computer, then stand up often!   Copyright  VHI. All rights reserved.  Posture - Standing   Good posture is important. Avoid slouching and forward head thrust. Maintain curve in low back and align ears over shoul- ders, hips over ankles.  Pull your belly button in toward your back bone.   Copyright  VHI. All rights reserved.  Posture - Sitting   Sit upright, head facing forward. Try using a roll to support lower back. Keep shoulders relaxed, and avoid rounded back. Keep hips level with knees. Avoid crossing legs for long periods.   Copyright  VHI. All rights reserved.   Graydon Dingwall, PT, ATRIC Certified Exercise Expert for the Aging Adult  08/19/23 10:56 AM Phone:  [phone] Fax: [phone]

## 2023-08-23 ENCOUNTER — Encounter: Payer: Self-pay | Admitting: Internal Medicine

## 2023-08-25 ENCOUNTER — Encounter: Payer: Self-pay | Admitting: Physical Therapy

## 2023-08-25 ENCOUNTER — Ambulatory Visit: Admitting: Physical Therapy

## 2023-08-25 DIAGNOSIS — M5441 Lumbago with sciatica, right side: Secondary | ICD-10-CM | POA: Diagnosis not present

## 2023-08-25 DIAGNOSIS — R29898 Other symptoms and signs involving the musculoskeletal system: Secondary | ICD-10-CM | POA: Diagnosis not present

## 2023-08-25 DIAGNOSIS — G8929 Other chronic pain: Secondary | ICD-10-CM | POA: Diagnosis not present

## 2023-08-25 DIAGNOSIS — M5459 Other low back pain: Secondary | ICD-10-CM | POA: Diagnosis not present

## 2023-08-25 DIAGNOSIS — M6281 Muscle weakness (generalized): Secondary | ICD-10-CM

## 2023-08-25 DIAGNOSIS — R2689 Other abnormalities of gait and mobility: Secondary | ICD-10-CM

## 2023-08-25 MED ORDER — TIRZEPATIDE-WEIGHT MANAGEMENT 5 MG/0.5ML ~~LOC~~ SOLN: 5.0000 mg | SUBCUTANEOUS | 0 refills | Status: DC

## 2023-08-25 NOTE — Therapy (Signed)
 OUTPATIENT PHYSICAL THERAPY THORACOLUMBAR TREATMENT  Patient Name: Candace [name] MRN: [id number] DOB:10/20/1980, 43 y.o., female Today's Date: 08/25/2023   PT End of Session - 08/25/23 1018     Visit Number 3    Number of Visits --   1-2x/week   Date for PT Re-Evaluation 10/06/23    Authorization Type [employer]    PT Start Time 1017    PT Stop Time 1057    PT Time Calculation (min) 40 min           Past Medical History:  Diagnosis Date   Anxiety    no meds   Depression    no meds   Hypertension    does not take BP med regularly, instructed to take med nest 3 days.   Past Surgical History:  Procedure Laterality Date   DILATION AND CURETTAGE OF UTERUS     x 2 polyps removed   DILATION AND EVACUATION N/A 02/10/2013   Procedure: DILATATION AND EVACUATION;  Surgeon: Truman Corona, MD;  Location: [facility];  Service: Gynecology;  Laterality: N/A;  with intra operative us    WISDOM TOOTH EXTRACTION  03/05/2014   Patient Active Problem List   Diagnosis Date Noted   Lumbar radiculopathy, acute 07/05/2023   Acne 03/19/2022   Urge incontinence 03/28/2021   Female infertility 07/14/2019   Polycystic ovary syndrome 07/14/2019   ADD (attention deficit disorder) 06/22/2018   Prediabetes 03/23/2017   OSA (obstructive sleep apnea) 01/08/2017   Obesity 06/26/2016   Vitamin D  deficiency 08/12/2015   Alopecia 06/11/2014   Multinodular thyroid  12/01/2011   Cervical polyp 11/26/2011   Eczema 11/26/2011   Essential hypertension, benign 11/25/2011    PCP: Geofm Glade PARAS, MD  REFERRING PROVIDER: Joane Artist RAMAN, MD  THERAPY DIAG:  Other low back pain  Muscle weakness  Other abnormalities of gait and mobility  REFERRING DIAG: Chronic right-sided low back pain with right-sided sciatica [M54.41, G89.29], Right leg weakness [R29.898]  Rationale for Evaluation and Treatment:  Rehabilitation  SUBJECTIVE:  PERTINENT PAST HISTORY:  ADD, 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 Yes, Number of falls: multiple falls following original injury d/t R leg weakness  MOI/History of condition:  Onset date: End of May  SUBJECTIVE STATEMENT Pt reports that she is having 5/10 morning pain.  She is still having instances of buckling.     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  No heavy lifting.  She had sudden R radicular pain and weakness.  She has gone to a chiro and reflexology which was not helpful.  She had and MRI showing large disk extrusion at L4-L5.  She had an ESI which was helpful.  She continues to endorse some weakness of R LE with some numbness.  She does feel that it is improving with time.   Pain:  Are you having pain? 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  GENERAL OBSERVATION/GAIT: Reduced time in R stance   LE MMT:  MMT Right (Eval) Left (Eval)  Hip flexion (L2, L3) 3+ 4+  Knee extension (L3) 3+ 4+  Knee flexion    Hip abduction    Hip extension    Hip external rotation    Hip internal rotation    Hip adduction    Ankle dorsiflexion (L4) c c  Ankle plantarflexion (S1) c c  Ankle inversion    Ankle eversion    Great Toe ext (L5) c c  Grossly     (Blank rows = not tested, score listed is out of 5 possible points.  N = WNL, D =  diminished, C = clear for gross weakness with myotome testing, * = concordant pain with testing)  SPECIAL TESTS:  Straight leg raise: L (-), R (-) Slump: L (-), R (-)   LE ROM:  ROM Right (Eval) Left (Eval)  Hip flexion    Hip extension    Hip abduction    Hip adduction    Hip internal rotation    Hip external rotation    Knee flexion    Knee extension    Ankle dorsiflexion    Ankle plantarflexion    Ankle inversion    Ankle eversion      (Blank rows = not tested, N = WNL, * = concordant pain with testing)  Functional Tests  Eval                                                              PATIENT SURVEYS:  LEFS: 39/80  TODAY'S TREATMENT  [facility] Adult PT Treatment:                                                DATE: 08/25/2023 Therapeutic Exercise: Prone Press Up  x 10 L/R/C  VC for correct execution Prone Hip extension 2 x 10 R and L Seated Sciatic Tensioner   R hip ext to neutral in supine Hip adduction with pilates ring - 2x10 - 5'' Bridge 2x6 5''  Therapeutic Activity  Quad with alternating kicks Bird dog - 2x10  HOME EXERCISE PROGRAM: Access Code: [email or web address] URL: [email or web address] Date: 08/11/2023 Prepared by: Helene Gasmen  Exercises - Prone Press Up  - 2-5 x daily - 7 x weekly - 2 sets - 10 reps - Seated Sciatic Tensioner  - 2 x daily - 7 x weekly - 20 reps Added 08-18-23 - Prone Hip Extension  - 1 x daily - 7 x weekly - 2 sets - 10 reps - 5 hold - Supine Transversus Abdominis Bracing - Hands on Thighs  - 1 x daily - 7 x weekly - 2 sets - 10 reps - 5sec hold - Standing Lumbar Extension  - 3 x daily - 7 x weekly - 1 sets - 10 reps - 5 hold    Treatment priorities   Eval 08-19-23 7/23      Ext based?  R hip ext tolerance      Increase activity as tolerated  1014.4 ft                         ASSESSMENT:  CLINICAL IMPRESSION: Continued with ext based program today which did not seem to have a  major positive effect.  Pt is significantly limited in R hip ext which should be addressed in next visits.  Will start to address R LE weakness directly as well.    EVAL- Candace [name] is a 43 y.o. female who presents to clinic with signs and sxs consistent with R sided radiculopathy.  MRI confirmed disk extrusion at L4/L5 on R.  Continues to show strength deficit R LE.  Overall improving significantly since onset and ESI.   Brin will benefit from skilled PT to address relevant deficits and improve LE strength and function in order to complete daily tasks and return to work and recreation.   OBJECTIVE IMPAIRMENTS: Pain, neural tension, LE strength, gait  ACTIVITY LIMITATIONS: standing, working, lifting, walking  PERSONAL FACTORS: See medical history and pertinent history   REHAB POTENTIAL: Good  CLINICAL DECISION MAKING: Evolving/moderate complexity  EVALUATION COMPLEXITY: Moderate   GOALS:   SHORT TERM GOALS: Target date: 09/08/2023   Rollande will be >75% HEP compliant to improve carryover between sessions and facilitate independent management of condition  Evaluation: ongoing Goal status: INITIAL   LONG TERM GOALS: Target date: 10/06/2023   Tapanga will self report >/= 50% decrease in pain from evaluation to improve function in daily tasks  Evaluation/Baseline: 5/10 max pain Goal status: INITIAL   2.  Chyanna will show a >/= 18 pt improvement in LEFS score (MCID is ~11% or 9 pts) as a proxy for functional improvement   Evaluation/Baseline: 39 pts Goal status: INITIAL   3.  Maryelizabeth will be able to , not limited by pain  Evaluation/Baseline: limited Goal status: INITIAL   4.  Gunhild will report confidence in self management of condition at time of discharge with advanced HEP  Evaluation/Baseline: unable to self manage Goal status: INITIAL  5.  Shaunda will demonstrate roughly symmetrical LE strength testing   Evaluation/Baseline: asymmetric  Goal status: INITIAL   PLAN: PT  FREQUENCY: 1-2x/week  PT DURATION: 8 weeks  PLANNED INTERVENTIONS:  97164- PT Re-evaluation, 97110-Therapeutic exercises, 97530- Therapeutic activity, [id number]- Neuromuscular re-education, 97535- Self Care, 02859- Manual therapy, [id number]- Gait training, [id number]- Aquatic Therapy, [id number]- Electrical stimulation (manual), [id number]- Vasopneumatic device, [id number]- Traction (mechanical), [id number]- Ionotophoresis 4mg /ml Dexamethasone , Taping, Dry Needling, Joint manipulation, and Spinal manipulation.   Helene BRAVO Mohit Zirbes PT 08/25/23 11:05 AM Phone: [phone] Fax: [phone]

## 2023-08-26 NOTE — Telephone Encounter (Signed)
 Form from [employer] received by fax and placed in Brandy's box.

## 2023-08-26 NOTE — Telephone Encounter (Signed)
 Form completed, placed on Dr. Zollie Pee desk to review and sign.

## 2023-08-27 NOTE — Telephone Encounter (Signed)
 Form reviewed and signed by Dr. Denyse Amass, placed at the front desk for faxing/scanning.

## 2023-08-30 NOTE — Telephone Encounter (Signed)
 Form updated and placed at the front desk for faxing/scanning.

## 2023-09-01 ENCOUNTER — Encounter: Payer: Self-pay | Admitting: Physical Therapy

## 2023-09-01 ENCOUNTER — Ambulatory Visit: Admitting: Physical Therapy

## 2023-09-01 DIAGNOSIS — M5441 Lumbago with sciatica, right side: Secondary | ICD-10-CM | POA: Diagnosis not present

## 2023-09-01 DIAGNOSIS — M6281 Muscle weakness (generalized): Secondary | ICD-10-CM | POA: Diagnosis not present

## 2023-09-01 DIAGNOSIS — R2689 Other abnormalities of gait and mobility: Secondary | ICD-10-CM | POA: Diagnosis not present

## 2023-09-01 DIAGNOSIS — M5459 Other low back pain: Secondary | ICD-10-CM | POA: Diagnosis not present

## 2023-09-01 DIAGNOSIS — R29898 Other symptoms and signs involving the musculoskeletal system: Secondary | ICD-10-CM | POA: Diagnosis not present

## 2023-09-01 DIAGNOSIS — G8929 Other chronic pain: Secondary | ICD-10-CM | POA: Diagnosis not present

## 2023-09-01 NOTE — Telephone Encounter (Signed)
 Called and spoke with patient, her worker has not received the most updated forms. I will plan to contact her case worker, Ronal, tomorrow morning to f/u and resend forms if needed. Worse case, I can upload forms to pt chart for pt to submit. Pt is agreeable.

## 2023-09-01 NOTE — Therapy (Signed)
 OUTPATIENT PHYSICAL THERAPY THORACOLUMBAR TREATMENT  Patient Name: Candace [name] MRN: [id number] DOB:14-Oct-1980, 43 y.o., female Today's Date: 09/01/2023   PT End of Session - 09/01/23 1016     Visit Number 4    Number of Visits --   1-2x/week   Date for PT Re-Evaluation 10/06/23    Authorization Type [employer]    PT Start Time 1015    PT Stop Time 1056    PT Time Calculation (min) 41 min           Past Medical History:  Diagnosis Date   Anxiety    no meds   Depression    no meds   Hypertension    does not take BP med regularly, instructed to take med nest 3 days.   Past Surgical History:  Procedure Laterality Date   DILATION AND CURETTAGE OF UTERUS     x 2 polyps removed   DILATION AND EVACUATION N/A 02/10/2013   Procedure: DILATATION AND EVACUATION;  Surgeon: Truman Corona, MD;  Location: [facility];  Service: Gynecology;  Laterality: N/A;  with intra operative us    WISDOM TOOTH EXTRACTION  03/05/2014   Patient Active Problem List   Diagnosis Date Noted   Lumbar radiculopathy, acute 07/05/2023   Acne 03/19/2022   Urge incontinence 03/28/2021   Female infertility 07/14/2019   Polycystic ovary syndrome 07/14/2019   ADD (attention deficit disorder) 06/22/2018   Prediabetes 03/23/2017   OSA (obstructive sleep apnea) 01/08/2017   Obesity 06/26/2016   Vitamin D  deficiency 08/12/2015   Alopecia 06/11/2014   Multinodular thyroid  12/01/2011   Cervical polyp 11/26/2011   Eczema 11/26/2011   Essential hypertension, benign 11/25/2011    PCP: Geofm Glade PARAS, MD  REFERRING PROVIDER: Joane Artist RAMAN, MD  THERAPY DIAG:  Other low back pain  Muscle weakness  Other abnormalities of gait and mobility  REFERRING DIAG: Chronic right-sided low back pain with right-sided sciatica [M54.41, G89.29], Right leg weakness [R29.898]  Rationale for Evaluation and Treatment:  Rehabilitation  SUBJECTIVE:  PERTINENT PAST HISTORY:  ADD, 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 Yes, Number of falls: multiple falls following original injury d/t R leg weakness  MOI/History of condition:  Onset date: End of May  SUBJECTIVE STATEMENT Pt reports that she has been having a bit more pain over the last few days.  She has been more active in the garden recently.  Has appt with neurosurgeon scheduled.    EVAL-Candace [name] is a 43 y.o. female who presents to clinic with chief complaint of low back and LE pain after she was packing to go to the beach.  No heavy lifting.  She had sudden R radicular pain and weakness.  She has gone to a chiro and reflexology which was not helpful.  She had and MRI showing large disk extrusion at L4-L5.  She had an ESI which was helpful.  She continues to endorse some weakness of R LE with some numbness.  She does feel that it is improving with time.   Pain:  Are you having pain? 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  GENERAL OBSERVATION/GAIT: Reduced time in R stance   LE MMT:  MMT Right (Eval) Left (Eval)  Hip flexion (L2, L3) 3+ 4+  Knee extension (L3) 3+ 4+  Knee flexion    Hip abduction    Hip extension    Hip external rotation    Hip internal rotation    Hip adduction    Ankle dorsiflexion (L4) c c  Ankle plantarflexion (S1) c c  Ankle inversion    Ankle eversion    Great Toe ext (L5) c c  Grossly     (Blank rows = not  tested, score listed is out of 5 possible points.  N = WNL, D = diminished, C = clear for gross weakness with myotome testing, * = concordant pain with testing)  SPECIAL TESTS:  Straight leg raise: L (-), R (-) Slump: L (-), R (-)   LE ROM:  ROM Right (Eval) Left (Eval)  Hip flexion    Hip extension    Hip abduction    Hip adduction    Hip internal rotation    Hip external rotation    Knee flexion    Knee extension    Ankle dorsiflexion    Ankle plantarflexion    Ankle inversion    Ankle eversion      (Blank rows = not tested, N = WNL, * = concordant pain with testing)  Functional Tests  Eval                                                              PATIENT SURVEYS:  LEFS: 39/80  TODAY'S TREATMENT  [facility] Adult PT Treatment:                                                DATE: 09/01/2023 Therapeutic Exercise: R LAQ - 10x 0# SKTC  R hip flexor stretch EOM Quad rocking Cat camel Knee ext with RTB Sit to stand R SLR  Manual Therapy Gentle lumbar traction on physio ball  HOME EXERCISE PROGRAM: Access Code: [email or web address] URL: [email or web address] Date: 09/01/2023 Prepared by: Candace [name] Gasmen  Exercises - Prone Press Up  - 2-5 x daily - 7 x weekly - 2 sets - 10 reps - Standing Lumbar Extension  - 3 x daily - 7 x weekly - 1 sets - 10 reps - 5 hold - Seated Sciatic Nerve Tensioner  - 1 x daily - 7 x weekly - 2 sets - 20 reps - Prone Hip Extension  - 1 x daily - 7 x weekly - 2 sets - 10 reps - 5 hold - Supine 90/90 Abdominal Bracing  - 1 x daily - 7 x weekly - 1 sets - 10 reps - 5-10 seconds hold - Bird Dog  - 1 x daily - 7 x weekly - 1 sets - 10 reps - 10 second hold - Straight Leg Raise with External Rotation  - 1 x daily - 7 x weekly - 3 sets - 10 reps - Seated  Knee Extension with Resistance  - 1 x daily - 7 x weekly - 3 sets - 10 reps    Treatment priorities   Eval 08-19-23 7/23 7/30     Ext based?  R hip ext tolerance ''      Increase activity as tolerated    Traction and direction preference not helpful      1014.4 ft                         ASSESSMENT:  CLINICAL IMPRESSION: Continued with ext based program today which did not seem to have a major positive effect.  Pt with some increased R LE sxs over the last several days.  Attempted to find relieving stretch or movement but flexion/ext/traction did not significantly change sxs.  Worked on quad strengthening to improve R knee stability which was tolerated well.  Updated HEP.  Pt will have appt with neurosurgery next week.  EVAL- Candace [name] is a 43 y.o. female who presents to clinic with signs and sxs consistent with R sided radiculopathy.  MRI confirmed disk extrusion at L4/L5 on R.  Continues to show strength deficit R LE.  Overall improving significantly since onset and ESI.   Candace [name] will benefit from skilled PT to address relevant deficits and improve LE strength and function in order to complete daily tasks and return to work and recreation.   OBJECTIVE IMPAIRMENTS: Pain, neural tension, LE strength, gait  ACTIVITY LIMITATIONS: standing, working, lifting, walking  PERSONAL FACTORS: See medical history and pertinent history   REHAB POTENTIAL: Good  CLINICAL DECISION MAKING: Evolving/moderate complexity  EVALUATION COMPLEXITY: Moderate   GOALS:   SHORT TERM GOALS: Target date: 09/08/2023   Candace [name] will be >75% HEP compliant to improve carryover between sessions and facilitate independent management of condition  Evaluation: ongoing Goal status: Ongoing    LONG TERM GOALS: Target date: 10/06/2023   Candace [name] will self report >/= 50% decrease in pain from evaluation to improve function in daily tasks  Evaluation/Baseline: 5/10 max pain Goal status: INITIAL   2.  Candace [name] will show a >/= 18 pt improvement in LEFS score (MCID is ~11% or 9 pts) as a proxy for functional improvement   Evaluation/Baseline: 39 pts Goal status: INITIAL   3.  Candace [name]  will be able to , not limited by pain  Evaluation/Baseline: limited Goal status: INITIAL   4.  Candace [name] will report confidence in self management of condition at time of discharge with advanced HEP  Evaluation/Baseline: unable to self manage Goal status: INITIAL  5.  Candace [name] will demonstrate roughly symmetrical LE strength testing   Evaluation/Baseline: asymmetric  Goal status: INITIAL   PLAN: PT FREQUENCY: 1-2x/week  PT DURATION: 8 weeks  PLANNED INTERVENTIONS:  97164- PT Re-evaluation, 97110-Therapeutic exercises, 97530- Therapeutic activity, [id number]- Neuromuscular re-education, 97535- Self Care, 02859- Manual therapy, [id number]- Gait training, [id number]- Aquatic Therapy, [phone]- Electrical stimulation (manual), [id number]- Vasopneumatic device, [id number]- Traction (mechanical), [id number]- Ionotophoresis 4mg /ml Dexamethasone , Taping, Dry Needling, Joint manipulation, and Spinal manipulation.   Candace [name] BRAVO Fernando Stoiber PT 09/01/23 1:32 PM Phone: [phone] Fax: [phone]

## 2023-09-02 NOTE — Telephone Encounter (Signed)
 Additional forms received. New forms completed, previous form updated, reviewed and signed by Dr. Joane, and placed at the front desk for faxing/scanning.

## 2023-09-08 ENCOUNTER — Ambulatory Visit: Admitting: Neurosurgery

## 2023-09-08 ENCOUNTER — Ambulatory Visit: Admitting: Physical Therapy

## 2023-09-08 VITALS — Wt 196.2 lb

## 2023-09-08 DIAGNOSIS — R29898 Other symptoms and signs involving the musculoskeletal system: Secondary | ICD-10-CM | POA: Diagnosis not present

## 2023-09-08 DIAGNOSIS — M5116 Intervertebral disc disorders with radiculopathy, lumbar region: Secondary | ICD-10-CM | POA: Diagnosis not present

## 2023-09-08 DIAGNOSIS — M5416 Radiculopathy, lumbar region: Secondary | ICD-10-CM

## 2023-09-08 NOTE — Progress Notes (Signed)
 Referring Physician:  Geofm Glade PARAS, MD [address] [place],  [place] [address]  Primary Physician:  Geofm Glade PARAS, MD  History of Present Illness: 09/08/2023 [name] is here today with a chief complaint of right-sided lumbar radiculopathy.  She is been followed by Dr. Margart in the pain group as well as physical therapy.  She is here today for persistent symptoms.  This originally started in May of this year.  She started with back pain radiating down her legs.  Felt a knot and then felt worsening weakness.  Was seen in clinic and was scheduled to get evaluation by her pain team.  She had epidural spinal injections which gave her good relief of her pain but she still continues to have weakness.  She has been working with physical therapy for 4 out of her 6 sessions.  She does state that her pain is better and that her weakness is better than it was right at that immediate onset but is not back to her baseline.  She does have numbness.  Pain radiates from her back into her groin and down her right leg to the medial side of her distal right lower extremity.  She feels weakness in her thigh as well as in her ankle.  She is not having any bowel or bladder discomfort.  No incontinence.  Conservative measures:  Physical therapy:  had an initial consult on 08/11/2023, is currently on 4 out of 6 weeks Multimodal medical therapy including regular antiinflammatories: Hydrocodone , Gabapentin   Injections: 07/15/2023 R sided disc extrusion L4/L5 with ESI  The symptoms are causing a significant impact on the patient's life.   I have utilized the care everywhere function in epic to review the outside records available from external health systems.  Review of Systems:  A 10 point review of systems is negative, except for the pertinent positives and negatives detailed in the HPI.  Past Medical History: Past Medical History:  Diagnosis Date   Anxiety    no meds   Depression    no meds    Hypertension    does not take BP med regularly, instructed to take med nest 3 days.    Past Surgical History: Past Surgical History:  Procedure Laterality Date   DILATION AND CURETTAGE OF UTERUS     x 2 polyps removed   DILATION AND EVACUATION N/A 02/10/2013   Procedure: DILATATION AND EVACUATION;  Surgeon: Truman Corona, MD;  Location: [facility];  Service: Gynecology;  Laterality: N/A;  with intra operative us    WISDOM TOOTH EXTRACTION  03/05/2014    Allergies: Allergies as of 09/08/2023 - Review Complete 09/01/2023  Allergen Reaction Noted   Amlodipine   11/20/2016    Medications:  Current Outpatient Medications:    gabapentin  (NEURONTIN ) 300 MG capsule, Take 1-2 capsules (300-600 mg total) by mouth 3 (three) times daily as needed., Disp: 90 capsule, Rfl: 3   HYDROcodone -acetaminophen  (NORCO/VICODIN) 5-325 MG tablet, Take 1 tablet by mouth every 6 (six) hours as needed., Disp: 15 tablet, Rfl: 0   hydroquinone  4 % cream, Apply topically as directed. Apply every night under breast and every other night to underarms x 3 months, Disp: 28.35 g, Rfl: 2   lisdexamfetamine (VYVANSE ) 40 MG capsule, Take 1 capsule (40 mg) by mouth every morning., Disp: 30 capsule, Rfl: 0   methylPREDNISolone  (MEDROL  DOSEPAK) 4 MG TBPK tablet, Taper as directed on package, Disp: 21 tablet, Rfl: 0   metroNIDAZOLE  (METROGEL ) 0.75 % vaginal gel,  Insert 1 applicatorful vaginally daily for 5 days., Disp: 70 g, Rfl: 0   nebivolol  (BYSTOLIC ) 10 MG tablet, Take 1 tablet (10 mg total) by mouth daily., Disp: 90 tablet, Rfl: 3   norethindrone  (AYGESTIN ) 5 MG tablet, Take 1 tablet (5 mg total) by mouth daily., Disp: 10 tablet, Rfl: 0   nystatin -triamcinolone  ointment (MYCOLOG), Apply topically 2 times daily to affected areas under breast for 1 week, Disp: 30 g, Rfl: 1   Safety Seal Miscellaneous MISC, Brogaine with minoxidil usp 7% and finasteride USP 0.1% apply to the affected areas with cotton ball or qtip every morning.,  Disp: 30 mL, Rfl: 2   Tazarotene  (ARAZLO ) 0.045 % LOTN, Apply 1 application  topically at bedtime. Apply pea sized amount to face at bedtime 2 nights per week x 1 month then increase to 3 nights per week., Disp: 45 g, Rfl: 2   tirzepatide  5 MG/0.5ML injection vial, Inject 5 mg into the skin once a week., Disp: 2 mL, Rfl: 0  Social History: Social History   Tobacco Use   Smoking status: Never   Smokeless tobacco: Never  Vaping Use   Vaping status: Never Used  Substance Use Topics   Alcohol use: Not Currently    Alcohol/week: 3.0 standard drinks of alcohol    Types: 3 Standard drinks or equivalent per week    Comment: weekends - mixed drinks   Drug use: No    Family Medical History: Family History  Problem Relation Age of Onset   Diabetes Father    Hypertension Father    Hypertension Sister    Hypertension Brother    Diabetes Maternal Grandmother    Hypertension Maternal Grandmother     Physical Examination: There were no vitals filed for this visit.  General: Patient is in no apparent distress. Attention to examination is appropriate.  Neck:   Supple.  Full range of motion.  Respiratory: Patient is breathing without any difficulty.   NEUROLOGICAL:     Awake, alert, oriented to person, place, and time.  Speech is clear and fluent.   Cranial Nerves: Pupils equal round and reactive to light.  Facial tone is symmetric.  Facial sensation is symmetric. Shoulder shrug is symmetric. Tongue protrusion is midline.    Strength: On physical examination today she shows full strength in her iliopsoas, she does have weakness approximately 4 out of 5 in knee extension.  She has some weakness in dorsiflexion 4 out of 5.  She also has weakness in inversion.  Her eversion is slightly weak but less so.  Her plantarflexion is strong.    Her reflexes are slightly decreased at the right patella and right medial hamstring compared to the left side but they are still present and not completely  absent.  She has a slight decrease in her sensation in the L4 dermatome.  Positive straight leg raise  Imaging: Narrative & Impression  CLINICAL DATA:  Low back pain, cauda equina syndrome suspected. Right-sided low back pain. Right leg pain and weakness.   EXAM: MRI LUMBAR SPINE WITHOUT CONTRAST   TECHNIQUE: Multiplanar, multisequence MR imaging of the lumbar spine was performed. No intravenous contrast was administered.   COMPARISON:  Lumbar spine radiographs 07/12/2023   FINDINGS: Segmentation:  Standard.   Alignment:  Normal.   Vertebrae: No fracture, suspicious marrow lesion, or significant marrow edema.   Conus medullaris and cauda equina: Conus extends to the L2-3 level, borderline low and normal in signal. Normal appearance of the cauda equina.  Paraspinal and other soft tissues: Unremarkable.   Disc levels:   L1-2 through L3-4: Negative.   L4-5: Disc desiccation and mild disc space narrowing. Disc bulging, a T2 hyperintense right subarticular to right foraminal disc extrusion with mild cranial migration, and mild facet hypertrophy result in right lateral recess stenosis and moderate to severe right and mild-to-moderate left neural foraminal stenosis, likely with right L4 nerve root impingement. No spinal stenosis.   L5-S1: Minimal disc bulging without stenosis.   IMPRESSION: Right-sided disc extrusion at L4-5 with right lateral recess and right neural foraminal stenosis.     Electronically Signed   By: Dasie Hamburg M.D.    I have personally reviewed the images and agree with the above interpretation.  There is a disc fragment that appears to be extruded.  It does have some slightly abnormal appearance in his location, easily confused with a dorsal root ganglion but appears separate from the nerve.  Causing compression of the L4 nerve as it exits.  Medical Decision Making/Assessment and Plan: Ms. Tholl is a pleasant 43 y.o. female with right-sided L4  radiculopathy.  This secondary to a disc extrusion at the 4 5 space with some migration.  Causing compression of her L4 nerve root.  Clinically she has a significant L4 radiculopathy with weakness in her right lower extremity and decreased sensation in the L4 dermatome.  On physical examination she has weakness in her right lower extremity and decree sensation.  Slightly decreased reflexes.  Positive straight leg raise.  She is currently finished 4 out of her 6 physical therapy appointments.  She is also had epidural spinal injection which did help extremely with her overall pain level, however she still does get numbness tingling burning in her right lower extremity in the L4 distribution as well as weakness in the L4 distribution.  I did discuss with her that given her ongoing weakness she would be a candidate for surgery at this time however she is doing well with physical therapy and given her lack of worsening I do think that she could continue to work with physical therapy as an option.  She would like to avoid surgery if possible.  She will plan on working with physical therapy for 2 more weeks.  She like to continue to monitor her progress.  I like to see her back in approximately 1 month to evaluate for any worsening or any lack of improvement.  If it is the same or worse at that point I did offer a right sided L4-5 minimally invasive discectomy.  She is in agreement with this plan.  She like to come back for follow-up.  Thank you for involving me in the care of this patient.    Penne MICAEL Sharps MD/MSCR Neurosurgery

## 2023-09-10 ENCOUNTER — Ambulatory Visit (INDEPENDENT_AMBULATORY_CARE_PROVIDER_SITE_OTHER)

## 2023-09-10 DIAGNOSIS — B351 Tinea unguium: Secondary | ICD-10-CM

## 2023-09-10 NOTE — Progress Notes (Signed)
 Patient presents today for the 3rd laser treatment. Diagnosed with mycotic nail infection by Dr. Magdalen.   Toenail most affected 1st, 4th and 5th bilaterally. All affected nails are showing new clear growth. Patient reports noticeable improvement as well.   All other systems are negative.  No filing or trimming performed today per patient request due to having this done yesterday in preparation of appointment. Laser therapy was administered to 1st, 4th and 5th toenails bilaterally and patient tolerated the treatment well. All safety precautions were in place.    Follow up in 3 months with Dr. Magdalen.

## 2023-09-10 NOTE — Addendum Note (Signed)
 Addended by: Vincenta Steffey W on: 09/10/2023 02:08 PM   Modules accepted: Orders

## 2023-09-16 ENCOUNTER — Encounter: Admitting: Physical Therapy

## 2023-09-16 ENCOUNTER — Encounter: Payer: Self-pay | Admitting: Physical Therapy

## 2023-09-16 ENCOUNTER — Ambulatory Visit: Attending: Family Medicine | Admitting: Physical Therapy

## 2023-09-16 DIAGNOSIS — M6281 Muscle weakness (generalized): Secondary | ICD-10-CM | POA: Diagnosis not present

## 2023-09-16 DIAGNOSIS — R2689 Other abnormalities of gait and mobility: Secondary | ICD-10-CM | POA: Insufficient documentation

## 2023-09-16 DIAGNOSIS — M5459 Other low back pain: Secondary | ICD-10-CM | POA: Diagnosis not present

## 2023-09-16 NOTE — Therapy (Signed)
 OUTPATIENT PHYSICAL THERAPY THORACOLUMBAR TREATMENT  Patient Name: Candace [name] MRN: [id number] DOB:06-03-1980, 43 y.o., female Today's Date: 09/16/2023   PT End of Session - 09/16/23 0718     Visit Number 5    Number of Visits --   1-2x/week   Date for PT Re-Evaluation 10/06/23    Authorization Type [employer]    PT Start Time [phone]    PT Stop Time 0757    PT Time Calculation (min) 40 min           Past Medical History:  Diagnosis Date   Anxiety    no meds   Depression    no meds   Hypertension    does not take BP med regularly, instructed to take med nest 3 days.   Past Surgical History:  Procedure Laterality Date   DILATION AND CURETTAGE OF UTERUS     x 2 polyps removed   DILATION AND EVACUATION N/A 02/10/2013   Procedure: DILATATION AND EVACUATION;  Surgeon: Candace Corona, MD;  Location: [facility];  Service: Gynecology;  Laterality: N/A;  with intra operative us    WISDOM TOOTH EXTRACTION  03/05/2014   Patient Active Problem List   Diagnosis Date Noted   Herniation of lumbar intervertebral disc with radiculopathy 09/08/2023   Right leg weakness 09/08/2023   Lumbar radiculopathy, acute 07/05/2023   Acne 03/19/2022   Urge incontinence 03/28/2021   Female infertility 07/14/2019   Polycystic ovary syndrome 07/14/2019   ADD (attention deficit disorder) 06/22/2018   Prediabetes 03/23/2017   OSA (obstructive sleep apnea) 01/08/2017   Obesity 06/26/2016   Vitamin D  deficiency 08/12/2015   Alopecia 06/11/2014   Multinodular thyroid  12/01/2011   Cervical polyp 11/26/2011   Eczema 11/26/2011   Essential hypertension, benign 11/25/2011    PCP: Candace Glade PARAS, MD  REFERRING PROVIDER: Geofm Glade PARAS, MD  THERAPY DIAG:  Other low back pain  Muscle weakness  Other abnormalities of gait and mobility  REFERRING DIAG: Chronic right-sided low back pain with right-sided sciatica [M54.41, G89.29], Right leg weakness [R29.898]  Rationale for Evaluation and Treatment:   Rehabilitation  SUBJECTIVE:  PERTINENT PAST HISTORY:  ADD, R sided disc extrusion L4/L5 with ESI       PRECAUTIONS: Fall  WEIGHT BEARING RESTRICTIONS No  FALLS:  Has patient fallen in last 6 months? Yes, Number of falls: multiple falls following original injury d/t R leg weakness  MOI/History of condition:  Onset date: End of May  SUBJECTIVE STATEMENT Pt went to neurosurgeon last week.  They decided she could finish PT and then reassess.   EVAL-Candace [name] is a 43 y.o. female who presents to clinic with chief complaint of low back and LE pain after she was packing to go to the beach.  No heavy lifting.  She had sudden R radicular pain and weakness.  She has gone to a chiro and reflexology which was not helpful.  She had and MRI showing large disk extrusion at L4-L5.  She had an ESI which was helpful.  She continues to endorse some weakness of R LE with some numbness.  She does feel that it is improving with time.   Pain:  Are you having pain? 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  GENERAL OBSERVATION/GAIT: Reduced time in R stance   LE MMT:  MMT Right (Eval) Left (Eval)  Hip flexion (L2, L3) 3+ 4+  Knee extension (L3) 3+ 4+  Knee flexion    Hip abduction    Hip extension    Hip external rotation    Hip internal rotation    Hip adduction    Ankle dorsiflexion (L4) c c  Ankle plantarflexion (S1) c c  Ankle inversion    Ankle eversion    Great Toe ext (L5) c c   Grossly     (Blank rows = not tested, score listed is out of 5 possible points.  N = WNL, D = diminished, C = clear for gross weakness with myotome testing, * = concordant pain with testing)  SPECIAL TESTS:  Straight leg raise: L (-), R (-) Slump: L (-), R (-)   LE ROM:  ROM Right (Eval) Left (Eval)  Hip flexion    Hip extension    Hip abduction    Hip adduction    Hip internal rotation    Hip external rotation    Knee flexion    Knee extension    Ankle dorsiflexion    Ankle plantarflexion    Ankle inversion    Ankle eversion      (Blank rows = not tested, N = WNL, * = concordant pain with testing)  Functional Tests  Eval                                                              PATIENT SURVEYS:  LEFS: 39/80  TODAY'S TREATMENT  [facility] Adult PT Treatment:                                                DATE: 09/16/2023 Therapeutic Exercise: nu-step L5 78m while taking subjective and planning session with patient R SAQ - 3# - 2x10 LAQ - 0# - 2x10 SKTC  Therapeutic Activity  Sit to stand - raised table with mirror for even weight distribution Step up - 4'' step  TKE - GTB    HOME EXERCISE PROGRAM: Access Code: [email or web address] URL: [email or web address] Date: 09/16/2023 Prepared by: Candace [name]  Exercises - Seated Sciatic Nerve Tensioner  - 1 x daily - 7 x weekly - 2 sets - 20 reps - Supine 90/90 Abdominal Bracing  - 1 x daily - 7 x weekly - 1 sets - 10 reps - 5-10 seconds hold - Straight Leg Raise with External Rotation  - 1 x daily - 7 x weekly - 3 sets - 10 reps - Seated Knee Extension with Resistance  - 1 x daily - 7 x weekly - 3 sets - 10 reps - Supine Short Arc Quad  - 1 x daily - 7 x weekly - 2 sets - 10 reps - 5'' hold - Standing Terminal Knee Extension with Resistance  - 1 x daily - 7 x weekly - 2 sets - 10 reps - 5'' hold  Treatment priorities   Eval 08-19-23 7/23 7/30     Ext based?  R hip ext tolerance ''      Increase activity as tolerated    Traction and direction preference not helpful      1014.4 ft                         ASSESSMENT:  CLINICAL IMPRESSION: Candace [name] tolerated session well with no adverse reaction.  Concentrated mainly on quad strengthening today which with R LE continuing to show significant deficits compared to L.  Emphasized the need for strong end range quad contraction which did improve throughout session.  Pt with smoother gait patter after completing session.  Updated HEP.   EVAL- Lorre is a 43 y.o. female who presents to clinic with signs and sxs consistent with R sided radiculopathy.  MRI confirmed disk extrusion at L4/L5 on R.  Continues to show strength deficit R LE.  Overall improving significantly since onset and ESI.   Breshae will benefit from skilled PT to address relevant deficits and improve LE strength and function in order to complete daily tasks and return to work and recreation.   OBJECTIVE IMPAIRMENTS: Pain, neural tension, LE strength, gait  ACTIVITY LIMITATIONS: standing, working, lifting, walking  PERSONAL FACTORS: See medical history and pertinent history   REHAB POTENTIAL: Good  CLINICAL DECISION MAKING: Evolving/moderate complexity  EVALUATION COMPLEXITY: Moderate   GOALS:   SHORT TERM GOALS: Target date: 09/08/2023   Renelda will be >75% HEP compliant to improve carryover between sessions and facilitate independent management of condition  Evaluation: ongoing Goal status: Ongoing    LONG TERM GOALS: Target date: 10/06/2023   Deshonda will self report >/= 50% decrease in pain from evaluation to improve function in daily tasks  Evaluation/Baseline: 5/10 max pain Goal status: INITIAL   2.  Jacque will show a >/= 18 pt improvement in LEFS score (MCID is ~11% or 9 pts) as a proxy for functional improvement   Evaluation/Baseline: 39 pts Goal status: INITIAL   3.  Siara will be able to , not limited by pain  Evaluation/Baseline:  limited Goal status: INITIAL   4.  Jakyia will report confidence in self management of condition at time of discharge with advanced HEP  Evaluation/Baseline: unable to self manage Goal status: INITIAL  5.  Janai will demonstrate roughly symmetrical LE strength testing   Evaluation/Baseline: asymmetric  Goal status: INITIAL   PLAN: PT FREQUENCY: 1-2x/week  PT DURATION: 8 weeks  PLANNED INTERVENTIONS:  97164- PT Re-evaluation, 97110-Therapeutic exercises, 97530- Therapeutic activity, [id number]- Neuromuscular re-education, 97535- Self Care, [id number]- Manual therapy, [id number]- Gait training, [id number]- Aquatic Therapy, [phone]- Electrical stimulation (manual), [id number]- Vasopneumatic device, [id number]- Traction (mechanical), [id number]- Ionotophoresis 4mg /ml Dexamethasone , Taping, Dry Needling, Joint manipulation, and Spinal manipulation.   Candace BRAVO Kyley Solow PT 09/16/23 9:38 AM Phone: [phone] Fax: [phone]

## 2023-09-22 ENCOUNTER — Ambulatory Visit: Admitting: Physical Therapy

## 2023-09-22 ENCOUNTER — Encounter: Payer: Self-pay | Admitting: Physical Therapy

## 2023-09-22 DIAGNOSIS — M6281 Muscle weakness (generalized): Secondary | ICD-10-CM | POA: Diagnosis not present

## 2023-09-22 DIAGNOSIS — R2689 Other abnormalities of gait and mobility: Secondary | ICD-10-CM | POA: Diagnosis not present

## 2023-09-22 DIAGNOSIS — M5459 Other low back pain: Secondary | ICD-10-CM | POA: Diagnosis not present

## 2023-09-22 NOTE — Therapy (Signed)
 OUTPATIENT PHYSICAL THERAPY THORACOLUMBAR TREATMENT  Patient Name: Candace [name] MRN: [id number] DOB:1980-04-22, 43 y.o., female Today's Date: 09/22/2023   PT End of Session - 09/22/23 0851     Visit Number 6    Number of Visits --   1-2x/week   Date for PT Re-Evaluation 10/06/23    Authorization Type [employer]    PT Start Time [id number]    PT Stop Time 0930    PT Time Calculation (min) 40 min           Past Medical History:  Diagnosis Date   Anxiety    no meds   Depression    no meds   Hypertension    does not take BP med regularly, instructed to take med nest 3 days.   Past Surgical History:  Procedure Laterality Date   DILATION AND CURETTAGE OF UTERUS     x 2 polyps removed   DILATION AND EVACUATION N/A 02/10/2013   Procedure: DILATATION AND EVACUATION;  Surgeon: Truman Corona, MD;  Location: [facility];  Service: Gynecology;  Laterality: N/A;  with intra operative us    WISDOM TOOTH EXTRACTION  03/05/2014   Patient Active Problem List   Diagnosis Date Noted   Herniation of lumbar intervertebral disc with radiculopathy 09/08/2023   Right leg weakness 09/08/2023   Lumbar radiculopathy, acute 07/05/2023   Acne 03/19/2022   Urge incontinence 03/28/2021   Female infertility 07/14/2019   Polycystic ovary syndrome 07/14/2019   ADD (attention deficit disorder) 06/22/2018   Prediabetes 03/23/2017   OSA (obstructive sleep apnea) 01/08/2017   Obesity 06/26/2016   Vitamin D  deficiency 08/12/2015   Alopecia 06/11/2014   Multinodular thyroid  12/01/2011   Cervical polyp 11/26/2011   Eczema 11/26/2011   Essential hypertension, benign 11/25/2011    PCP: Geofm Glade PARAS, MD  REFERRING PROVIDER: Geofm Glade PARAS, MD  THERAPY DIAG:  Other low back pain  Muscle weakness  Other abnormalities of gait and mobility  REFERRING DIAG: Chronic right-sided low back pain with right-sided sciatica [M54.41, G89.29], Right leg weakness [R29.898]  Rationale for Evaluation and Treatment:   Rehabilitation  SUBJECTIVE:  PERTINENT PAST HISTORY:  ADD, R sided disc extrusion L4/L5 with ESI       PRECAUTIONS: Fall  WEIGHT BEARING RESTRICTIONS No  FALLS:  Has patient fallen in last 6 months? Yes, Number of falls: multiple falls following original injury d/t R leg weakness  MOI/History of condition:  Onset date: End of May  SUBJECTIVE STATEMENT Pr reports that she has been more busy at work.  She is uncomfortable after sitting for longer periods of time.   EVAL-Candace [name] is a 43 y.o. female who presents to clinic with chief complaint of low back and LE pain after she was packing to go to the beach.  No heavy lifting.  She had sudden R radicular pain and weakness.  She has gone to a chiro and reflexology which was not helpful.  She had and MRI showing large disk extrusion at L4-L5.  She had an ESI which was helpful.  She continues to endorse some weakness of R LE with some numbness.  She does feel that it is improving with time.   Pain:  Are you having pain? 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  GENERAL OBSERVATION/GAIT: Reduced time in R stance   LE MMT:  MMT Right (Eval) Left (Eval)  Hip flexion (L2, L3) 3+ 4+  Knee extension (L3) 3+ 4+  Knee flexion    Hip abduction    Hip extension    Hip external rotation    Hip internal rotation    Hip adduction    Ankle dorsiflexion (L4) c c  Ankle plantarflexion (S1) c c  Ankle inversion    Ankle eversion    Great  Toe ext (L5) c c  Grossly     (Blank rows = not tested, score listed is out of 5 possible points.  N = WNL, D = diminished, C = clear for gross weakness with myotome testing, * = concordant pain with testing)  SPECIAL TESTS:  Straight leg raise: L (-), R (-) Slump: L (-), R (-)   LE ROM:  ROM Right (Eval) Left (Eval)  Hip flexion    Hip extension    Hip abduction    Hip adduction    Hip internal rotation    Hip external rotation    Knee flexion    Knee extension    Ankle dorsiflexion    Ankle plantarflexion    Ankle inversion    Ankle eversion      (Blank rows = not tested, N = WNL, * = concordant pain with testing)  Functional Tests  Eval                                                              PATIENT SURVEYS:  LEFS: 39/80  TODAY'S TREATMENT  [facility] Adult PT Treatment:                                                DATE: 09/22/2023  Neuromuscular re-ed: R SAQ - 4# - 3x10 LAQ - 0# - 2x10, 2# 2x10 Quad set - 5'' 2x10 Knee ext with band - RTB - 2x10  Therapeutic Activity: Sit to stand - raised table with mirror for even weight distribution Standing hip abd w/ YTB Standing hip ext with YTB    HOME EXERCISE PROGRAM: Access Code: [email or web address] URL: [email or web address] Date: 09/22/2023 Prepared by: Helene Gasmen  Exercises - Seated Sciatic Nerve Tensioner  - 1 x daily - 7 x weekly - 2 sets - 20 reps - Seated Knee Extension with Resistance  - 1 x daily - 7 x weekly - 3 sets - 10 reps - Supine Short Arc Quad  - 1 x daily - 7 x weekly - 2 sets - 10 reps - 5'' hold - Standing Hip Extension Kicks  - 1 x daily - 7 x weekly - 2 sets - 10 reps - Seated Quad Set  - 1 x daily - 7 x weekly - 2 sets - 10 reps - 5 sec hold    Treatment priorities   Eval 08-19-23 7/23 7/30     Ext based?  R hip ext tolerance ''  Increase activity as tolerated    Traction and direction preference not helpful      1014.4 ft                          ASSESSMENT:  CLINICAL IMPRESSION: Almee tolerated session well with no adverse reaction.  Pt continues to show improving R quad contraction.  Concentrated on quad strengthening to good effect.  Pt able to progress resistance and achieve full ROM knee ext today, though she does fatigue after about 5 reps.  Updated HEP.  Hip ext ROM improved today and pt is now able to lay with hip in neutral.  EVAL- Elyn is a 43 y.o. female who presents to clinic with signs and sxs consistent with R sided radiculopathy.  MRI confirmed disk extrusion at L4/L5 on R.  Continues to show strength deficit R LE.  Overall improving significantly since onset and ESI.   Coretta will benefit from skilled PT to address relevant deficits and improve LE strength and function in order to complete daily tasks and return to work and recreation.   OBJECTIVE IMPAIRMENTS: Pain, neural tension, LE strength, gait  ACTIVITY LIMITATIONS: standing, working, lifting, walking  PERSONAL FACTORS: See medical history and pertinent history   REHAB POTENTIAL: Good  CLINICAL DECISION MAKING: Evolving/moderate complexity  EVALUATION COMPLEXITY: Moderate   GOALS:   SHORT TERM GOALS: Target date: 09/08/2023   Wilburta will be >75% HEP compliant to improve carryover between sessions and facilitate independent management of condition  Evaluation: ongoing Goal status: Ongoing    LONG TERM GOALS: Target date: 10/06/2023   Aeon will self report >/= 50% decrease in pain from evaluation to improve function in daily tasks  Evaluation/Baseline: 5/10 max pain Goal status: INITIAL   2.  Christionna will show a >/= 18 pt improvement in LEFS score (MCID is ~11% or 9 pts) as a proxy for functional improvement   Evaluation/Baseline: 39 pts Goal status: INITIAL   3.  Susi will be able to , not limited by pain  Evaluation/Baseline: limited Goal status: INITIAL   4.  Mazzy will report confidence in self management of condition at time of  discharge with advanced HEP  Evaluation/Baseline: unable to self manage Goal status: INITIAL  5.  Kalesha will demonstrate roughly symmetrical LE strength testing   Evaluation/Baseline: asymmetric  Goal status: INITIAL   PLAN: PT FREQUENCY: 1-2x/week  PT DURATION: 8 weeks  PLANNED INTERVENTIONS:  97164- PT Re-evaluation, 97110-Therapeutic exercises, 97530- Therapeutic activity, [id number]- Neuromuscular re-education, 97535- Self Care, 02859- Manual therapy, [id number]- Gait training, [id number]- Aquatic Therapy, [id number]- Electrical stimulation (manual), [id number]- Vasopneumatic device, [id number]- Traction (mechanical), [id number]- Ionotophoresis 4mg /ml Dexamethasone , Taping, Dry Needling, Joint manipulation, and Spinal manipulation.   Helene BRAVO Devonta Blanford PT 09/22/23 9:45 AM Phone: [phone] Fax: [phone]

## 2023-09-24 ENCOUNTER — Encounter: Payer: Self-pay | Admitting: Internal Medicine

## 2023-09-28 ENCOUNTER — Other Ambulatory Visit: Payer: Self-pay

## 2023-09-28 ENCOUNTER — Ambulatory Visit: Payer: Self-pay | Admitting: Physical Therapy

## 2023-09-28 ENCOUNTER — Encounter: Payer: Self-pay | Admitting: Physical Therapy

## 2023-09-28 DIAGNOSIS — M5459 Other low back pain: Secondary | ICD-10-CM

## 2023-09-28 DIAGNOSIS — M6281 Muscle weakness (generalized): Secondary | ICD-10-CM | POA: Diagnosis not present

## 2023-09-28 DIAGNOSIS — R2689 Other abnormalities of gait and mobility: Secondary | ICD-10-CM

## 2023-09-28 MED ORDER — TIRZEPATIDE-WEIGHT MANAGEMENT 7.5 MG/0.5ML ~~LOC~~ SOLN: 7.5000 mg | SUBCUTANEOUS | 2 refills | Status: DC

## 2023-09-28 NOTE — Therapy (Signed)
 OUTPATIENT PHYSICAL THERAPY THORACOLUMBAR TREATMENT  Patient Name: Candace [name] MRN: [id number] DOB:July 27, 1980, 43 y.o., female Today's Date: 09/28/2023   PT End of Session - 09/28/23 0804     Visit Number 7    Number of Visits --   1-2x/week   Date for PT Re-Evaluation 10/06/23    Authorization Type [employer]    PT Start Time 0804    PT Stop Time 0844    PT Time Calculation (min) 40 min           Past Medical History:  Diagnosis Date   Anxiety    no meds   Depression    no meds   Hypertension    does not take BP med regularly, instructed to take med nest 3 days.   Past Surgical History:  Procedure Laterality Date   DILATION AND CURETTAGE OF UTERUS     x 2 polyps removed   DILATION AND EVACUATION N/A 02/10/2013   Procedure: DILATATION AND EVACUATION;  Surgeon: Candace Corona, MD;  Location: [facility];  Service: Gynecology;  Laterality: N/A;  with intra operative us    WISDOM TOOTH EXTRACTION  03/05/2014   Patient Active Problem List   Diagnosis Date Noted   Herniation of lumbar intervertebral disc with radiculopathy 09/08/2023   Right leg weakness 09/08/2023   Lumbar radiculopathy, acute 07/05/2023   Acne 03/19/2022   Urge incontinence 03/28/2021   Female infertility 07/14/2019   Polycystic ovary syndrome 07/14/2019   ADD (attention deficit disorder) 06/22/2018   Prediabetes 03/23/2017   OSA (obstructive sleep apnea) 01/08/2017   Obesity 06/26/2016   Vitamin D  deficiency 08/12/2015   Alopecia 06/11/2014   Multinodular thyroid  12/01/2011   Cervical polyp 11/26/2011   Eczema 11/26/2011   Essential hypertension, benign 11/25/2011    PCP: Candace Glade PARAS, MD  REFERRING PROVIDER: Joane Artist RAMAN, MD  THERAPY DIAG:  Other low back pain  Muscle weakness  Other abnormalities of gait and mobility  REFERRING DIAG: Chronic right-sided low back pain with right-sided sciatica [M54.41, G89.29], Right leg weakness [R29.898]  Rationale for Evaluation and Treatment:   Rehabilitation  SUBJECTIVE:  PERTINENT PAST HISTORY:  ADD, R sided disc extrusion L4/L5 with ESI       PRECAUTIONS: Fall  WEIGHT BEARING RESTRICTIONS No  FALLS:  Has patient fallen in last 6 months? Yes, Number of falls: multiple falls following original injury d/t R leg weakness  MOI/History of condition:  Onset date: End of May  SUBJECTIVE STATEMENT Pt reports that she see's some improvement.  She reports that her HEP is going well.  EVAL-Candace [name] is a 43 y.o. female who presents to clinic with chief complaint of low back and LE pain after she was packing to go to the beach.  No heavy lifting.  She had sudden R radicular pain and weakness.  She has gone to a chiro and reflexology which was not helpful.  She had and MRI showing large disk extrusion at L4-L5.  She had an ESI which was helpful.  She continues to endorse some weakness of R LE with some numbness.  She does feel that it is improving with time.   Pain:  Are you having pain? 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  GENERAL OBSERVATION/GAIT: Reduced time in R stance   LE MMT:  MMT Right (Eval) Left (Eval)  Hip flexion (L2, L3) 3+ 4+  Knee extension (L3) 3+ 4+  Knee flexion    Hip abduction    Hip extension    Hip external rotation    Hip internal rotation    Hip adduction    Ankle dorsiflexion (L4) c c  Ankle plantarflexion (S1) c c  Ankle inversion    Ankle eversion    Great Toe ext (L5) c c  Grossly      (Blank rows = not tested, score listed is out of 5 possible points.  N = WNL, D = diminished, C = clear for gross weakness with myotome testing, * = concordant pain with testing)  SPECIAL TESTS:  Straight leg raise: L (-), R (-) Slump: L (-), R (-)   LE ROM:  ROM Right (Eval) Left (Eval)  Hip flexion    Hip extension    Hip abduction    Hip adduction    Hip internal rotation    Hip external rotation    Knee flexion    Knee extension    Ankle dorsiflexion    Ankle plantarflexion    Ankle inversion    Ankle eversion      (Blank rows = not tested, N = WNL, * = concordant pain with testing)  Functional Tests  Eval                                                              PATIENT SURVEYS:  LEFS: 39/80  TODAY'S TREATMENT  [facility] Adult PT Treatment:                                                DATE: 09/28/2023  Therex: Bike 5 min  Neuromuscular re-ed: R SAQ - 5# - 3x10 LAQ - 5# 2x10 Knee ext with blue TB  Therapeutic Activity: Cybex leg press - 20# - bil - 10x Staggered - 2x10 Standing hip abd machine - 12.5# - 15x ea    HOME EXERCISE PROGRAM: Access Code: [email or web address] URL: [email or web address] Date: 09/22/2023 Prepared by: Candace [name]  Exercises - Seated Sciatic Nerve Tensioner  - 1 x daily - 7 x weekly - 2 sets - 20 reps - Seated Knee Extension with Resistance  - 1 x daily - 7 x weekly - 3 sets - 10 reps - Supine Short Arc Quad  - 1 x daily - 7 x weekly - 2 sets - 10 reps - 5'' hold - Standing Hip Extension Kicks  - 1 x daily - 7 x weekly - 2 sets - 10 reps - Seated Quad Set  - 1 x daily - 7 x weekly - 2 sets - 10 reps - 5 sec hold    Treatment priorities   Eval 08-19-23 7/23 7/30     Ext based?  R hip ext tolerance ''     Increase activity as tolerated    Traction  and direction preference not helpful      1014.4 ft                         ASSESSMENT:  CLINICAL IMPRESSION: Candace [name] tolerated session  well with no adverse reaction.  Pt continues to show improving R quad contraction.  Progressed load with isolated strengthening and added in leg press with bias for R leg to avoid compensation.  R LE still at significant strength deficit, but improving.  EVAL- Candace [name] is a 43 y.o. female who presents to clinic with signs and sxs consistent with R sided radiculopathy.  MRI confirmed disk extrusion at L4/L5 on R.  Continues to show strength deficit R LE.  Overall improving significantly since onset and ESI.   Tinzley will benefit from skilled PT to address relevant deficits and improve LE strength and function in order to complete daily tasks and return to work and recreation.   OBJECTIVE IMPAIRMENTS: Pain, neural tension, LE strength, gait  ACTIVITY LIMITATIONS: standing, working, lifting, walking  PERSONAL FACTORS: See medical history and pertinent history   REHAB POTENTIAL: Good  CLINICAL DECISION MAKING: Evolving/moderate complexity  EVALUATION COMPLEXITY: Moderate   GOALS:   SHORT TERM GOALS: Target date: 09/08/2023   Zoriana will be >75% HEP compliant to improve carryover between sessions and facilitate independent management of condition  Evaluation: ongoing Goal status: Ongoing    LONG TERM GOALS: Target date: 10/06/2023   Danelia will self report >/= 50% decrease in pain from evaluation to improve function in daily tasks  Evaluation/Baseline: 5/10 max pain Goal status: INITIAL   2.  Caisley will show a >/= 18 pt improvement in LEFS score (MCID is ~11% or 9 pts) as a proxy for functional improvement   Evaluation/Baseline: 39 pts Goal status: INITIAL   3.  Astha will be able to , not limited by pain  Evaluation/Baseline: limited Goal status: INITIAL   4.  Rogena will report confidence in self management of condition at time of discharge with advanced HEP  Evaluation/Baseline: unable to self manage Goal status: INITIAL  5.  Shantera will demonstrate roughly symmetrical LE  strength testing   Evaluation/Baseline: asymmetric  Goal status: INITIAL   PLAN: PT FREQUENCY: 1-2x/week  PT DURATION: 8 weeks  PLANNED INTERVENTIONS:  97164- PT Re-evaluation, 97110-Therapeutic exercises, 97530- Therapeutic activity, [id number]- Neuromuscular re-education, 97535- Self Care, 02859- Manual therapy, [id number]- Gait training, [id number]- Aquatic Therapy, [phone]- Electrical stimulation (manual), [id number]- Vasopneumatic device, [id number]- Traction (mechanical), [id number]- Ionotophoresis 4mg /ml Dexamethasone , Taping, Dry Needling, Joint manipulation, and Spinal manipulation.   Candace BRAVO Tearra Ouk PT 09/28/23 8:44 AM Phone: [phone] Fax: [phone]

## 2023-10-08 ENCOUNTER — Ambulatory Visit: Payer: Self-pay | Attending: Family Medicine | Admitting: Physical Therapy

## 2023-10-08 ENCOUNTER — Encounter: Payer: Self-pay | Admitting: Physical Therapy

## 2023-10-08 DIAGNOSIS — R2689 Other abnormalities of gait and mobility: Secondary | ICD-10-CM | POA: Insufficient documentation

## 2023-10-08 DIAGNOSIS — M6281 Muscle weakness (generalized): Secondary | ICD-10-CM | POA: Diagnosis not present

## 2023-10-08 DIAGNOSIS — M5459 Other low back pain: Secondary | ICD-10-CM | POA: Insufficient documentation

## 2023-10-08 NOTE — Therapy (Signed)
 OUTPATIENT PHYSICAL THERAPY THORACOLUMBAR TREATMENT  Patient Name: Candace [name] MRN: [id number] DOB:1980/07/30, 43 y.o., female Today's Date: 10/08/2023   PT End of Session - 10/08/23 0716     Visit Number 8    Number of Visits --   1-2x/week   Date for PT Re-Evaluation 10/06/23    Authorization Type [employer]    PT Start Time 0715    PT Stop Time 0756    PT Time Calculation (min) 41 min           Past Medical History:  Diagnosis Date   Anxiety    no meds   Depression    no meds   Hypertension    does not take BP med regularly, instructed to take med nest 3 days.   Past Surgical History:  Procedure Laterality Date   DILATION AND CURETTAGE OF UTERUS     x 2 polyps removed   DILATION AND EVACUATION N/A 02/10/2013   Procedure: DILATATION AND EVACUATION;  Surgeon: Candace Corona, MD;  Location: [facility];  Service: Gynecology;  Laterality: N/A;  with intra operative us    WISDOM TOOTH EXTRACTION  03/05/2014   Patient Active Problem List   Diagnosis Date Noted   Herniation of lumbar intervertebral disc with radiculopathy 09/08/2023   Right leg weakness 09/08/2023   Lumbar radiculopathy, acute 07/05/2023   Acne 03/19/2022   Urge incontinence 03/28/2021   Female infertility 07/14/2019   Polycystic ovary syndrome 07/14/2019   ADD (attention deficit disorder) 06/22/2018   Prediabetes 03/23/2017   OSA (obstructive sleep apnea) 01/08/2017   Obesity 06/26/2016   Vitamin D  deficiency 08/12/2015   Alopecia 06/11/2014   Multinodular thyroid  12/01/2011   Cervical polyp 11/26/2011   Eczema 11/26/2011   Essential hypertension, benign 11/25/2011    PCP: Candace Glade PARAS, MD  REFERRING PROVIDER: Geofm Glade PARAS, MD  THERAPY DIAG:  Other low back pain  Muscle weakness  Other abnormalities of gait and mobility  REFERRING DIAG: Chronic right-sided low back pain with right-sided sciatica [M54.41, G89.29], Right leg weakness [R29.898]  Rationale for Evaluation and Treatment:   Rehabilitation  SUBJECTIVE:  PERTINENT PAST HISTORY:  ADD, R sided disc extrusion L4/L5 with ESI       PRECAUTIONS: Fall  WEIGHT BEARING RESTRICTIONS No  FALLS:  Has patient fallen in last 6 months? Yes, Number of falls: multiple falls following original injury d/t R leg weakness  MOI/History of condition:  Onset date: End of May  SUBJECTIVE STATEMENT Pt reports continued improvement.  Minimal groin pain.  No instance of the leg going out.  EVAL-Candace [name] is a 43 y.o. female who presents to clinic with chief complaint of low back and LE pain after she was packing to go to the beach.  No heavy lifting.  She had sudden R radicular pain and weakness.  She has gone to a chiro and reflexology which was not helpful.  She had and MRI showing large disk extrusion at L4-L5.  She had an ESI which was helpful.  She continues to endorse some weakness of R LE with some numbness.  She does feel that it is improving with time.   Pain:  Are you having pain? 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  GENERAL OBSERVATION/GAIT: Reduced time in R stance   LE MMT:  MMT Right (Eval) Left (Eval)  Hip flexion (L2, L3) 3+ 4+  Knee extension (L3) 3+ 4+  Knee flexion    Hip abduction    Hip extension    Hip external rotation    Hip internal rotation    Hip adduction    Ankle dorsiflexion (L4) c c  Ankle plantarflexion (S1) c c  Ankle inversion    Ankle eversion    Great Toe ext (L5) c c   Grossly     (Blank rows = not tested, score listed is out of 5 possible points.  N = WNL, D = diminished, C = clear for gross weakness with myotome testing, * = concordant pain with testing)  SPECIAL TESTS:  Straight leg raise: L (-), R (-) Slump: L (-), R (-)   LE ROM:  ROM Right (Eval) Left (Eval)  Hip flexion    Hip extension    Hip abduction    Hip adduction    Hip internal rotation    Hip external rotation    Knee flexion    Knee extension    Ankle dorsiflexion    Ankle plantarflexion    Ankle inversion    Ankle eversion      (Blank rows = not tested, N = WNL, * = concordant pain with testing)  Functional Tests  Eval                                                              PATIENT SURVEYS:  LEFS: 39/80  TODAY'S TREATMENT  [facility] Adult PT Treatment:                                                DATE: 10/08/2023  Therex: Bike 5 min  Neuromuscular re-ed: Bil knee ext machine Single leg knee ext  Therapeutic Activity: Standing hip abd machine - 12.5# - 15x ea Step up fwd an lat Bil HS curl Standing hip abd with band STS Reviewing goals and D/C planning    HOME EXERCISE PROGRAM: Access Code: [email or web address] URL: [email or web address] Date: 10/08/2023 Prepared by: Candace [name]  Exercises - Seated Sciatic Nerve Tensioner  - 1 x daily - 7 x weekly - 2 sets - 20 reps - Seated Knee Extension with Resistance  - 1 x daily - 7 x weekly - 3 sets - 10 reps - Standing Hip Extension Kicks  - 1 x daily - 2-3 x weekly - 2 sets - 10 reps - Standing Hip Abduction Kicks  - 1 x daily - 2-3 x weekly - 3 sets - 10 reps - Step Up  - 1 x daily - 2-3 x weekly - 3 sets - 10 reps - Lateral Step Up  - 1 x daily - 2-3 x weekly - 3 sets - 10 reps - Single Leg Knee Extension with Weight Machine  - 1 x daily - 2-3 x weekly - 3 sets - 10 reps -  Hamstring Curl with Weight Machine  - 1 x daily - 2-3 x weekly - 3 sets - 10 reps - Sit to Stand Without  Arm Support  - 1 x daily - 2-3 x weekly - 3 sets - 10 reps    Treatment priorities   Eval 08-19-23 7/23 7/30     Ext based?  R hip ext tolerance ''     Increase activity as tolerated    Traction and direction preference not helpful      1014.4 ft                         ASSESSMENT:  CLINICAL IMPRESSION: Candace [name] has progressed well with therapy.  She has showed consistent progress with knee ext and general R LE strength and feels confident continuing strengthening on her own.  She has made significant progress toward all long and short term goals.  Biggest deficit remains in R knee ext strength which is ~33% of unaffected LE.  This has shown consistent and significant improvement over the last several weeks.  I encouraged her to continue working on this at home and updated HEP.  She will return to MD next week and continue strengthening on her own with the goal of equalizing LE strength.  She can follow up with us  PRN.  EVAL- Pachia is a 43 y.o. female who presents to clinic with signs and sxs consistent with R sided radiculopathy.  MRI confirmed disk extrusion at L4/L5 on R.  Continues to show strength deficit R LE.  Overall improving significantly since onset and ESI.   Jeannine will benefit from skilled PT to address relevant deficits and improve LE strength and function in order to complete daily tasks and return to work and recreation.   OBJECTIVE IMPAIRMENTS: Pain, neural tension, LE strength, gait  ACTIVITY LIMITATIONS: standing, working, lifting, walking  PERSONAL FACTORS: See medical history and pertinent history   REHAB POTENTIAL: Good  CLINICAL DECISION MAKING: Evolving/moderate complexity  EVALUATION COMPLEXITY: Moderate   GOALS:   SHORT TERM GOALS: Target date: 09/08/2023   Lailee will be >75% HEP compliant to improve carryover between sessions and facilitate independent management of condition  Evaluation: ongoing Goal status: Ongoing    LONG TERM GOALS: Target  date: 10/06/2023 extended to 11/19/2023    Lynsee will self report >/= 50% decrease in pain from evaluation to improve function in daily tasks  Evaluation/Baseline: 5/10 max pain 9/5: 50% Goal status: MET   2.  Nakina will show a >/= 18 pt improvement in LEFS score (MCID is ~11% or 9 pts) as a proxy for functional improvement   Evaluation/Baseline: 39 pts 9/5: 55 pts Goal status: Partially met   3.  Jensine will be able to , not limited by pain  Evaluation/Baseline: limited Goal status: NA   4.  Kaniah will report confidence in self management of condition at time of discharge with advanced HEP  Evaluation/Baseline: unable to self manage 9/5: confident in continuing strengthening Goal status: MET  5.  Damya will demonstrate roughly symmetrical LE strength testing   Evaluation/Baseline: asymmetric  9/5: R: 10 lbs, L: 27 lbs Goal status: INITIAL   PLAN: PT FREQUENCY: 1-2x/week  PT DURATION: 6 weeks  PLANNED INTERVENTIONS:  [id number]- PT Re-evaluation, 97110-Therapeutic exercises, 97530- Therapeutic activity, [id number]- Neuromuscular re-education, 97535- Self Care, [id number]- Manual therapy, [id number]- Gait training, [id number]- Aquatic Therapy, [phone]- Electrical stimulation (manual), [id number]- Vasopneumatic device, [id number]- Traction (mechanical), [id number]- Ionotophoresis 4mg /ml Dexamethasone , Taping, Dry  Needling, Joint manipulation, and Spinal manipulation.   Candace BRAVO Terra Aveni PT 10/08/23 8:05 AM Phone: [phone] Fax: [phone]

## 2023-10-13 ENCOUNTER — Ambulatory Visit (INDEPENDENT_AMBULATORY_CARE_PROVIDER_SITE_OTHER): Admitting: Neurosurgery

## 2023-10-13 ENCOUNTER — Encounter: Payer: Self-pay | Admitting: Neurosurgery

## 2023-10-13 VITALS — BP 130/84 | Ht 64.0 in | Wt 191.0 lb

## 2023-10-13 DIAGNOSIS — M5116 Intervertebral disc disorders with radiculopathy, lumbar region: Secondary | ICD-10-CM

## 2023-10-13 DIAGNOSIS — M5416 Radiculopathy, lumbar region: Secondary | ICD-10-CM

## 2023-10-13 NOTE — Progress Notes (Signed)
 Referring Physician:  Geofm Glade PARAS, MD [address] [place],  [place] [address]  Primary Physician:  Geofm Glade PARAS, MD  History of Present Illness: 10/13/2023 [name] is here today with a chief complaint of right-sided lumbar radiculopathy.  She is been followed by Dr. Margart in the pain group as well as physical therapy.   Overall she feels like she has had a significant improvement in both her pain, numbness, and weakness.  Has been working hard with physical therapy.  She does continue to have some asymmetry as far as motor strength goes.  Conservative measures:  Physical therapy:  had an initial consult on 08/11/2023, is currently on 4 out of 6 weeks Multimodal medical therapy including regular antiinflammatories: Hydrocodone , Gabapentin   Injections: 07/15/2023 R sided disc extrusion L4/L5 with ESI  The symptoms are causing a significant impact on the patient's life.   I have utilized the care everywhere function in epic to review the outside records available from external health systems.  Review of Systems:  A 10 point review of systems is negative, except for the pertinent positives and negatives detailed in the HPI.  Past Medical History: Past Medical History:  Diagnosis Date   Anxiety    no meds   Depression    no meds   Hypertension    does not take BP med regularly, instructed to take med nest 3 days.    Past Surgical History: Past Surgical History:  Procedure Laterality Date   DILATION AND CURETTAGE OF UTERUS     x 2 polyps removed   DILATION AND EVACUATION N/A 02/10/2013   Procedure: DILATATION AND EVACUATION;  Surgeon: Truman Corona, MD;  Location: [facility];  Service: Gynecology;  Laterality: N/A;  with intra operative us    WISDOM TOOTH EXTRACTION  03/05/2014    Allergies: Allergies as of 10/13/2023 - Review Complete 10/13/2023  Allergen Reaction Noted   Amlodipine   11/20/2016    Medications:  Current Outpatient Medications:     HYDROcodone -acetaminophen  (NORCO/VICODIN) 5-325 MG tablet, Take 1 tablet by mouth every 6 (six) hours as needed., Disp: 15 tablet, Rfl: 0   hydroquinone  4 % cream, Apply topically as directed. Apply every night under breast and every other night to underarms x 3 months, Disp: 28.35 g, Rfl: 2   lisdexamfetamine (VYVANSE ) 40 MG capsule, Take 1 capsule (40 mg) by mouth every morning., Disp: 30 capsule, Rfl: 0   metroNIDAZOLE  (METROGEL ) 0.75 % vaginal gel, Insert 1 applicatorful vaginally daily for 5 days., Disp: 70 g, Rfl: 0   nebivolol  (BYSTOLIC ) 10 MG tablet, Take 1 tablet (10 mg total) by mouth daily., Disp: 90 tablet, Rfl: 3   norethindrone  (AYGESTIN ) 5 MG tablet, Take 1 tablet (5 mg total) by mouth daily., Disp: 10 tablet, Rfl: 0   nystatin -triamcinolone  ointment (MYCOLOG), Apply topically 2 times daily to affected areas under breast for 1 week, Disp: 30 g, Rfl: 1   Safety Seal Miscellaneous MISC, Brogaine with minoxidil usp 7% and finasteride USP 0.1% apply to the affected areas with cotton ball or qtip every morning., Disp: 30 mL, Rfl: 2   Tazarotene  (ARAZLO ) 0.045 % LOTN, Apply 1 application  topically at bedtime. Apply pea sized amount to face at bedtime 2 nights per week x 1 month then increase to 3 nights per week., Disp: 45 g, Rfl: 2   tirzepatide  5 MG/0.5ML injection vial, Inject 5 mg into the skin once a week., Disp: 2 mL, Rfl: 0   tirzepatide  7.5  MG/0.5ML injection vial, Inject 7.5 mg into the skin once a week., Disp: 2 mL, Rfl: 2  Social History: Social History   Tobacco Use   Smoking status: Never   Smokeless tobacco: Never  Vaping Use   Vaping status: Never Used  Substance Use Topics   Alcohol use: Not Currently    Alcohol/week: 3.0 standard drinks of alcohol    Types: 3 Standard drinks or equivalent per week    Comment: weekends - mixed drinks   Drug use: No    Family Medical History: Family History  Problem Relation Age of Onset   Diabetes Father    Hypertension  Father    Hypertension Sister    Hypertension Brother    Diabetes Maternal Grandmother    Hypertension Maternal Grandmother     Physical Examination: Vitals:   10/13/23 1053  BP: 130/84    General: Patient is in no apparent distress. Attention to examination is appropriate.  Neck:   Supple.  Full range of motion.  Respiratory: Patient is breathing without any difficulty.   NEUROLOGICAL:     Awake, alert, oriented to person, place, and time.  Speech is clear and fluent.   Cranial Nerves: Pupils equal round and reactive to light.  Facial tone is symmetric.  Facial sensation is symmetric. Shoulder shrug is symmetric. Tongue protrusion is midline.    Strength: On physical examination today she shows full strength in her iliopsoas, she does have weakness approximately 4+ out of 5 in knee extension.  She has some weakness in dorsiflexion 5.  Improved sensation, but still mild l4  Imaging: Narrative & Impression  CLINICAL DATA:  Low back pain, cauda equina syndrome suspected. Right-sided low back pain. Right leg pain and weakness.   EXAM: MRI LUMBAR SPINE WITHOUT CONTRAST   TECHNIQUE: Multiplanar, multisequence MR imaging of the lumbar spine was performed. No intravenous contrast was administered.   COMPARISON:  Lumbar spine radiographs 07/12/2023   FINDINGS: Segmentation:  Standard.   Alignment:  Normal.   Vertebrae: No fracture, suspicious marrow lesion, or significant marrow edema.   Conus medullaris and cauda equina: Conus extends to the L2-3 level, borderline low and normal in signal. Normal appearance of the cauda equina.   Paraspinal and other soft tissues: Unremarkable.   Disc levels:   L1-2 through L3-4: Negative.   L4-5: Disc desiccation and mild disc space narrowing. Disc bulging, a T2 hyperintense right subarticular to right foraminal disc extrusion with mild cranial migration, and mild facet hypertrophy result in right lateral recess stenosis and  moderate to severe right and mild-to-moderate left neural foraminal stenosis, likely with right L4 nerve root impingement. No spinal stenosis.   L5-S1: Minimal disc bulging without stenosis.   IMPRESSION: Right-sided disc extrusion at L4-5 with right lateral recess and right neural foraminal stenosis.     Electronically Signed   By: Candace [name] M.D.    I have personally reviewed the images and agree with the above interpretation.  There is a disc fragment that appears to be extruded.  It does have some slightly abnormal appearance in his location, easily confused with a dorsal root ganglion but appears separate from the nerve.  Causing compression of the L4 nerve as it exits.  Medical Decision Making/Assessment and Plan: Ms. Eskridge is a pleasant 43 y.o. female with right-sided L4 radiculopathy.  This secondary to a disc extrusion at the 4 5 space with some migration.  She has known compression of her right-sided L4 nerve root from a  cranially migrated disc fragment causing compression of her nerve in the neuroforamen.  She has known radiculopathy.  Thankfully she has had significant amount improvement with physical therapy and nonsurgical interventions.  She does continue to have mild deficit mostly in the L4 myotome.  However her straight leg raise is negative, her motor exam continues to improve, her sensory exam continues to improve.  We talked about surgery for decompression to help with her motor recovery, however an upward trajectory that there would be a risk of injuring her nerve or having complication, in the setting of these findings she would like to continue with conservative management as she does feel like she continues to have an upward trajectory.  She would like to reach out to us  if she has any worsening.  I let her know that we remain available and if needed can schedule a follow-up visit  Thank you for involving me in the care of this patient.    Penne MICAEL Sharps  MD/MSCR Neurosurgery

## 2023-11-02 ENCOUNTER — Encounter: Payer: Self-pay | Admitting: Internal Medicine

## 2023-11-02 NOTE — Progress Notes (Signed)
      Subjective:    Patient ID: Candace [name], female    DOB: 1981-01-01, 43 y.o.   MRN: [id number]     HPI Lucielle is here for follow up of her chronic medical problems.  Change bmi for obesity-  On Zepbound  for OSA  I saw her in June for acute lumbar radiculopathy  Medications and allergies reviewed with patient and updated if appropriate.  Current Outpatient Medications on File Prior to Visit  Medication Sig Dispense Refill  . HYDROcodone -acetaminophen  (NORCO/VICODIN) 5-325 MG tablet Take 1 tablet by mouth every 6 (six) hours as needed. 15 tablet 0  . hydroquinone  4 % cream Apply topically as directed. Apply every night under breast and every other night to underarms x 3 months 28.35 g 2  . metroNIDAZOLE  (METROGEL ) 0.75 % vaginal gel Insert 1 applicatorful vaginally daily for 5 days. 70 g 0  . norethindrone  (AYGESTIN ) 5 MG tablet Take 1 tablet (5 mg total) by mouth daily. 10 tablet 0  . nystatin -triamcinolone  ointment (MYCOLOG) Apply topically 2 times daily to affected areas under breast for 1 week 30 g 1  . Safety Seal Miscellaneous MISC Brogaine with minoxidil usp 7% and finasteride USP 0.1% apply to the affected areas with cotton ball or qtip every morning. 30 mL 2  . Tazarotene  (ARAZLO ) 0.045 % LOTN Apply 1 application  topically at bedtime. Apply pea sized amount to face at bedtime 2 nights per week x 1 month then increase to 3 nights per week. 45 g 2   No current facility-administered medications on file prior to visit.     Review of Systems     Objective:  There were no vitals filed for this visit. BP Readings from Last 3 Encounters:  11/25/23 138/68  11/23/23 136/70  10/13/23 130/84   Wt Readings from Last 3 Encounters:  11/25/23 183 lb (83 kg)  11/23/23 188 lb (85.3 kg)  10/13/23 191 lb (86.6 kg)   There is no height or weight on file to calculate BMI.    Physical Exam     Lab Results  Component Value Date   WBC 4.2 05/04/2023   HGB 12.5 05/04/2023    HCT 37.6 05/04/2023   PLT 214.0 05/04/2023   GLUCOSE 86 11/25/2023   CHOL 188 11/25/2023   TRIG 41.0 11/25/2023   HDL 51.90 11/25/2023   LDLCALC 128 (H) 11/25/2023   ALT 13 05/04/2023   AST 13 05/04/2023   NA 138 11/25/2023   K 3.4 (L) 11/25/2023   CL 104 11/25/2023   CREATININE 0.65 11/25/2023   BUN 16 11/25/2023   CO2 26 11/25/2023   TSH 1.67 11/25/2023   HGBA1C 5.8 11/25/2023     Assessment & Plan:    See Problem List for Assessment and Plan of chronic medical problems.    This encounter was created in error - please disregard.

## 2023-11-02 NOTE — Patient Instructions (Addendum)
      Blood work was ordered.       Medications changes include :   None    A referral was ordered and someone will call you to schedule an appointment.     Return in about 6 months (around 05/03/2024) for Physical Exam.

## 2023-11-03 ENCOUNTER — Encounter: Admitting: Internal Medicine

## 2023-11-03 DIAGNOSIS — R41840 Attention and concentration deficit: Secondary | ICD-10-CM

## 2023-11-03 DIAGNOSIS — E6609 Other obesity due to excess calories: Secondary | ICD-10-CM

## 2023-11-03 DIAGNOSIS — G4733 Obstructive sleep apnea (adult) (pediatric): Secondary | ICD-10-CM

## 2023-11-03 DIAGNOSIS — E559 Vitamin D deficiency, unspecified: Secondary | ICD-10-CM

## 2023-11-03 DIAGNOSIS — I10 Essential (primary) hypertension: Secondary | ICD-10-CM

## 2023-11-03 DIAGNOSIS — R7303 Prediabetes: Secondary | ICD-10-CM

## 2023-11-10 NOTE — Progress Notes (Signed)
      Subjective:    Patient ID: Candace [name], female    DOB: December 10, 1980, 43 y.o.   MRN: [id number]     HPI Candace [name] is here for follow up of her chronic medical problems.  Change bmi for obesity-  On Zepbound  for OSA   ? On birth control - advise no pregnancty  I saw her in June for acute lumbar radiculopathy   Medications and allergies reviewed with patient and updated if appropriate.  Current Outpatient Medications on File Prior to Visit  Medication Sig Dispense Refill  . HYDROcodone -acetaminophen  (NORCO/VICODIN) 5-325 MG tablet Take 1 tablet by mouth every 6 (six) hours as needed. 15 tablet 0  . hydroquinone  4 % cream Apply topically as directed. Apply every night under breast and every other night to underarms x 3 months 28.35 g 2  . metroNIDAZOLE  (METROGEL ) 0.75 % vaginal gel Insert 1 applicatorful vaginally daily for 5 days. 70 g 0  . norethindrone  (AYGESTIN ) 5 MG tablet Take 1 tablet (5 mg total) by mouth daily. 10 tablet 0  . nystatin -triamcinolone  ointment (MYCOLOG) Apply topically 2 times daily to affected areas under breast for 1 week 30 g 1  . Safety Seal Miscellaneous MISC Brogaine with minoxidil usp 7% and finasteride USP 0.1% apply to the affected areas with cotton ball or qtip every morning. 30 mL 2  . Tazarotene  (ARAZLO ) 0.045 % LOTN Apply 1 application  topically at bedtime. Apply pea sized amount to face at bedtime 2 nights per week x 1 month then increase to 3 nights per week. 45 g 2   No current facility-administered medications on file prior to visit.     Review of Systems     Objective:  There were no vitals filed for this visit. BP Readings from Last 3 Encounters:  11/25/23 138/68  11/23/23 136/70  10/13/23 130/84   Wt Readings from Last 3 Encounters:  11/25/23 183 lb (83 kg)  11/23/23 188 lb (85.3 kg)  10/13/23 191 lb (86.6 kg)   There is no height or weight on file to calculate BMI.    Physical Exam     Lab Results  Component Value  Date   WBC 4.2 05/04/2023   HGB 12.5 05/04/2023   HCT 37.6 05/04/2023   PLT 214.0 05/04/2023   GLUCOSE 86 11/25/2023   CHOL 188 11/25/2023   TRIG 41.0 11/25/2023   HDL 51.90 11/25/2023   LDLCALC 128 (H) 11/25/2023   ALT 13 05/04/2023   AST 13 05/04/2023   NA 138 11/25/2023   K 3.4 (L) 11/25/2023   CL 104 11/25/2023   CREATININE 0.65 11/25/2023   BUN 16 11/25/2023   CO2 26 11/25/2023   TSH 1.67 11/25/2023   HGBA1C 5.8 11/25/2023     Assessment & Plan:    See Problem List for Assessment and Plan of chronic medical problems.    This encounter was created in error - please disregard.

## 2023-11-10 NOTE — Patient Instructions (Addendum)
      Blood work was ordered.       Medications changes include :   None    A referral was ordered and someone will call you to schedule an appointment.     Return in about 6 months (around 05/11/2024) for Physical Exam.

## 2023-11-11 ENCOUNTER — Encounter: Admitting: Internal Medicine

## 2023-11-11 DIAGNOSIS — R7303 Prediabetes: Secondary | ICD-10-CM

## 2023-11-11 DIAGNOSIS — E559 Vitamin D deficiency, unspecified: Secondary | ICD-10-CM

## 2023-11-11 DIAGNOSIS — E66811 Obesity, class 1: Secondary | ICD-10-CM

## 2023-11-11 DIAGNOSIS — R41840 Attention and concentration deficit: Secondary | ICD-10-CM

## 2023-11-11 DIAGNOSIS — G4733 Obstructive sleep apnea (adult) (pediatric): Secondary | ICD-10-CM

## 2023-11-11 DIAGNOSIS — I10 Essential (primary) hypertension: Secondary | ICD-10-CM

## 2023-11-23 ENCOUNTER — Encounter: Payer: Self-pay | Admitting: Internal Medicine

## 2023-11-23 ENCOUNTER — Encounter: Admitting: Internal Medicine

## 2023-11-23 NOTE — Patient Instructions (Signed)
      Blood work was ordered.       Medications changes include :   None    A referral was ordered and someone will call you to schedule an appointment.     No follow-ups on file.

## 2023-11-23 NOTE — Progress Notes (Signed)
      Subjective:    Patient ID: Candace [name], female    DOB: 08-Sep-1980, 43 y.o.   MRN: [id number]     HPI Candace [name] is here for follow up of her chronic medical problems.  Change bmi for obesity-  On Zepbound  for OSA   ? On birth control - advise no pregnancty  I saw her in June for acute lumbar radiculopathy   Medications and allergies reviewed with patient and updated if appropriate.  Current Outpatient Medications on File Prior to Visit  Medication Sig Dispense Refill  . HYDROcodone -acetaminophen  (NORCO/VICODIN) 5-325 MG tablet Take 1 tablet by mouth every 6 (six) hours as needed. 15 tablet 0  . hydroquinone  4 % cream Apply topically as directed. Apply every night under breast and every other night to underarms x 3 months 28.35 g 2  . lisdexamfetamine (VYVANSE ) 40 MG capsule Take 1 capsule (40 mg) by mouth every morning. 30 capsule 0  . metroNIDAZOLE  (METROGEL ) 0.75 % vaginal gel Insert 1 applicatorful vaginally daily for 5 days. 70 g 0  . minoxidil (ROGAINE) 2 % external solution Apply topically.    . nebivolol  (BYSTOLIC ) 10 MG tablet Take 1 tablet (10 mg total) by mouth daily. 90 tablet 3  . norethindrone  (AYGESTIN ) 5 MG tablet Take 1 tablet (5 mg total) by mouth daily. 10 tablet 0  . nystatin -triamcinolone  ointment (MYCOLOG) Apply topically 2 times daily to affected areas under breast for 1 week 30 g 1  . Safety Seal Miscellaneous MISC Brogaine with minoxidil usp 7% and finasteride USP 0.1% apply to the affected areas with cotton ball or qtip every morning. 30 mL 2  . Tazarotene  (ARAZLO ) 0.045 % LOTN Apply 1 application  topically at bedtime. Apply pea sized amount to face at bedtime 2 nights per week x 1 month then increase to 3 nights per week. 45 g 2  . tirzepatide  7.5 MG/0.5ML injection vial Inject 7.5 mg into the skin once a week. 2 mL 2   No current facility-administered medications on file prior to visit.     Review of Systems     Objective:   Vitals:    11/23/23 1336  BP: 136/70  Pulse: 74  Temp: 98.4 F (36.9 C)  SpO2: 98%   BP Readings from Last 3 Encounters:  11/23/23 136/70  10/13/23 130/84  07/26/23 (!) 128/96   Wt Readings from Last 3 Encounters:  11/23/23 188 lb (85.3 kg)  10/13/23 191 lb (86.6 kg)  09/08/23 196 lb 3.2 oz (89 kg)   Body mass index is 32.27 kg/m.    Physical Exam     Lab Results  Component Value Date   WBC 4.2 05/04/2023   HGB 12.5 05/04/2023   HCT 37.6 05/04/2023   PLT 214.0 05/04/2023   GLUCOSE 91 05/04/2023   CHOL 208 (H) 05/04/2023   TRIG 57.0 05/04/2023   HDL 56.20 05/04/2023   LDLCALC 141 (H) 05/04/2023   ALT 13 05/04/2023   AST 13 05/04/2023   NA 137 05/04/2023   K 3.3 (L) 05/04/2023   CL 102 05/04/2023   CREATININE 0.72 05/04/2023   BUN 10 05/04/2023   CO2 26 05/04/2023   TSH 1.52 05/04/2023   HGBA1C 5.8 03/19/2022     Assessment & Plan:    See Problem List for Assessment and Plan of chronic medical problems.    This encounter was created in error - please disregard.

## 2023-11-24 ENCOUNTER — Encounter: Payer: Self-pay | Admitting: Internal Medicine

## 2023-11-24 DIAGNOSIS — E7800 Pure hypercholesterolemia, unspecified: Secondary | ICD-10-CM | POA: Insufficient documentation

## 2023-11-24 NOTE — Patient Instructions (Addendum)
      Blood work was ordered.       Medications changes include :   vitamin d  high dose - that take 2000 units a day    A referral for pulmonary for your sleep apnea.       Return in about 6 months (around 05/25/2024) for Physical Exam.

## 2023-11-24 NOTE — Progress Notes (Unsigned)
 Subjective:    Patient ID: Candace [name], female    DOB: [birth year], 43 y.o.   MRN: [id number]     HPI Seren is here for follow up of her chronic medical problems.  On Zepbound  for OSA   I saw her in June for acute lumbar radiculopathy-back pain is better   Off cpap for long time - does not feel rested.  Feels like she probably still needs it.  Exercise - a little.  Good with protein.  Eating less.      Medications and allergies reviewed with patient and updated if appropriate.  Current Outpatient Medications on File Prior to Visit  Medication Sig Dispense Refill   HYDROcodone -acetaminophen  (NORCO/VICODIN) 5-325 MG tablet Take 1 tablet by mouth every 6 (six) hours as needed. 15 tablet 0   hydroquinone  4 % cream Apply topically as directed. Apply every night under breast and every other night to underarms x 3 months 28.35 g 2   lisdexamfetamine (VYVANSE ) 40 MG capsule Take 1 capsule (40 mg) by mouth every morning. 30 capsule 0   metroNIDAZOLE  (METROGEL ) 0.75 % vaginal gel Insert 1 applicatorful vaginally daily for 5 days. 70 g 0   minoxidil (ROGAINE) 2 % external solution Apply topically.     norethindrone  (AYGESTIN ) 5 MG tablet Take 1 tablet (5 mg total) by mouth daily. 10 tablet 0   nystatin -triamcinolone  ointment (MYCOLOG) Apply topically 2 times daily to affected areas under breast for 1 week 30 g 1   Safety Seal Miscellaneous MISC Brogaine with minoxidil usp 7% and finasteride USP 0.1% apply to the affected areas with cotton ball or qtip every morning. 30 mL 2   Tazarotene  (ARAZLO ) 0.045 % LOTN Apply 1 application  topically at bedtime. Apply pea sized amount to face at bedtime 2 nights per week x 1 month then increase to 3 nights per week. 45 g 2   tirzepatide  7.5 MG/0.5ML injection vial Inject 7.5 mg into the skin once a week. 2 mL 2   No current facility-administered medications on file prior to visit.     Review of Systems  Constitutional:  Negative for  appetite change and fever.  Respiratory:  Negative for cough, shortness of breath and wheezing.   Cardiovascular:  Negative for chest pain, palpitations and leg swelling.  Neurological:  Positive for headaches. Negative for light-headedness.       Objective:   Vitals:   11/25/23 0941  BP: 138/68  Pulse: 87  Temp: 98.9 F (37.2 C)  SpO2: 98%   BP Readings from Last 3 Encounters:  11/25/23 138/68  11/23/23 136/70  10/13/23 130/84   Wt Readings from Last 3 Encounters:  11/25/23 183 lb (83 kg)  11/23/23 188 lb (85.3 kg)  10/13/23 191 lb (86.6 kg)   Body mass index is 31.41 kg/m.    Physical Exam Constitutional:      General: She is not in acute distress.    Appearance: Normal appearance.  HENT:     Head: Normocephalic and atraumatic.  Eyes:     Conjunctiva/sclera: Conjunctivae normal.  Cardiovascular:     Rate and Rhythm: Normal rate and regular rhythm.     Heart sounds: Normal heart sounds.  Pulmonary:     Effort: Pulmonary effort is normal. No respiratory distress.     Breath sounds: Normal breath sounds. No wheezing.  Musculoskeletal:     Cervical back: Neck supple.     Right lower leg: No edema.  Left lower leg: No edema.  Lymphadenopathy:     Cervical: No cervical adenopathy.  Skin:    General: Skin is warm and dry.     Findings: No rash.  Neurological:     Mental Status: She is alert. Mental status is at baseline.  Psychiatric:        Mood and Affect: Mood normal.        Behavior: Behavior normal.        Lab Results  Component Value Date   WBC 4.2 05/04/2023   HGB 12.5 05/04/2023   HCT 37.6 05/04/2023   PLT 214.0 05/04/2023   GLUCOSE 91 05/04/2023   CHOL 208 (H) 05/04/2023   TRIG 57.0 05/04/2023   HDL 56.20 05/04/2023   LDLCALC 141 (H) 05/04/2023   ALT 13 05/04/2023   AST 13 05/04/2023   NA 137 05/04/2023   K 3.3 (L) 05/04/2023   CL 102 05/04/2023   CREATININE 0.72 05/04/2023   BUN 10 05/04/2023   CO2 26 05/04/2023   TSH 1.52  05/04/2023   HGBA1C 5.8 03/19/2022     Assessment & Plan:    See Problem List for Assessment and Plan of chronic medical problems.

## 2023-11-25 ENCOUNTER — Ambulatory Visit: Payer: Self-pay | Admitting: Internal Medicine

## 2023-11-25 ENCOUNTER — Ambulatory Visit: Admitting: Internal Medicine

## 2023-11-25 ENCOUNTER — Other Ambulatory Visit (HOSPITAL_COMMUNITY): Payer: Self-pay

## 2023-11-25 VITALS — BP 138/68 | HR 87 | Temp 98.9°F | Ht 64.0 in | Wt 183.0 lb

## 2023-11-25 DIAGNOSIS — R7303 Prediabetes: Secondary | ICD-10-CM | POA: Diagnosis not present

## 2023-11-25 DIAGNOSIS — I10 Essential (primary) hypertension: Secondary | ICD-10-CM

## 2023-11-25 DIAGNOSIS — Z6831 Body mass index (BMI) 31.0-31.9, adult: Secondary | ICD-10-CM

## 2023-11-25 DIAGNOSIS — E6609 Other obesity due to excess calories: Secondary | ICD-10-CM

## 2023-11-25 DIAGNOSIS — E7800 Pure hypercholesterolemia, unspecified: Secondary | ICD-10-CM

## 2023-11-25 DIAGNOSIS — E282 Polycystic ovarian syndrome: Secondary | ICD-10-CM

## 2023-11-25 DIAGNOSIS — E66811 Obesity, class 1: Secondary | ICD-10-CM | POA: Diagnosis not present

## 2023-11-25 DIAGNOSIS — E559 Vitamin D deficiency, unspecified: Secondary | ICD-10-CM

## 2023-11-25 DIAGNOSIS — G4733 Obstructive sleep apnea (adult) (pediatric): Secondary | ICD-10-CM

## 2023-11-25 DIAGNOSIS — R41840 Attention and concentration deficit: Secondary | ICD-10-CM

## 2023-11-25 LAB — BASIC METABOLIC PANEL WITH GFR
BUN: 16 mg/dL (ref 6–23)
CO2: 26 meq/L (ref 19–32)
Calcium: 9.1 mg/dL (ref 8.4–10.5)
Chloride: 104 meq/L (ref 96–112)
Creatinine, Ser: 0.65 mg/dL (ref 0.40–1.20)
GFR: 108.09 mL/min (ref >60.00)
Glucose, Bld: 86 mg/dL (ref 70–99)
Potassium: 3.4 meq/L — ABNORMAL LOW (ref 3.5–5.1)
Sodium: 138 meq/L (ref 135–145)

## 2023-11-25 LAB — LIPID PANEL
Cholesterol: 188 mg/dL (ref 0–200)
HDL: 51.9 mg/dL (ref >39.00)
LDL Cholesterol: 128 mg/dL — ABNORMAL HIGH (ref 0–99)
NonHDL: 136.43
Total CHOL/HDL Ratio: 4
Triglycerides: 41 mg/dL (ref 0.0–149.0)
VLDL: 8.2 mg/dL (ref 0.0–40.0)

## 2023-11-25 LAB — HEMOGLOBIN A1C: Hgb A1c MFr Bld: 5.8 % (ref 4.6–6.5)

## 2023-11-25 LAB — VITAMIN D 25 HYDROXY (VIT D DEFICIENCY, FRACTURES): VITD: 16.82 ng/mL — ABNORMAL LOW (ref 30.00–100.00)

## 2023-11-25 LAB — TSH: TSH: 1.67 u[IU]/mL (ref 0.35–5.50)

## 2023-11-25 MED ORDER — LISDEXAMFETAMINE DIMESYLATE 40 MG PO CAPS
40.0000 mg | ORAL_CAPSULE | ORAL | 0 refills | Status: AC
  Filled 2023-11-25: qty 30, 30d supply, fill #0

## 2023-11-25 MED ORDER — NEBIVOLOL HCL 2.5 MG PO TABS
2.5000 mg | ORAL_TABLET | Freq: Every day | ORAL | 3 refills | Status: AC
  Filled 2023-11-25: qty 90, 90d supply, fill #0

## 2023-11-25 MED ORDER — TIRZEPATIDE-WEIGHT MANAGEMENT 7.5 MG/0.5ML ~~LOC~~ SOAJ
7.5000 mg | SUBCUTANEOUS | 2 refills | Status: AC
  Filled 2023-11-25 – 2023-11-26 (×2): qty 2, 28d supply, fill #0

## 2023-11-25 MED ORDER — VITAMIN D (ERGOCALCIFEROL) 1.25 MG (50000 UNIT) PO CAPS
50000.0000 [IU] | ORAL_CAPSULE | ORAL | 0 refills | Status: AC
  Filled 2023-11-25: qty 8, 56d supply, fill #0

## 2023-11-25 NOTE — Assessment & Plan Note (Addendum)
 Chronic BMI 31.41 Working on weight loss and has been successful Doing telephone visits wellness nurse from work monthly and discussing diet, exercise and weight loss Continue Zepbound  7.5 mg weekly Stressed regular exercise including resistance training Stressed good protein intake, lots of water Needs to make lifestyle changes in order to make sure that this weight loss is permanent and sustainable

## 2023-11-25 NOTE — Assessment & Plan Note (Signed)
 Chronic Lab Results  Component Value Date   HGBA1C 5.8 03/19/2022   Check a1c Low sugar / carb diet Stressed regular exercise

## 2023-11-25 NOTE — Assessment & Plan Note (Addendum)
 Chronic Has not been taking her medication because she did not feel like she needed it-blood pressure has improved with weight loss, but is not ideally controlled Start Bystolic  2.5 mg daily-hopefully this is enough to control her BP Continue weight loss efforts Advised monitoring BP at home - goal < 130/80 BMP

## 2023-11-25 NOTE — Assessment & Plan Note (Signed)
Chronic Regular exercise and healthy diet encouraged Check lipid panel  Continue lifestyle control 

## 2023-11-25 NOTE — Assessment & Plan Note (Signed)
Chronic Controlled, stable Continue vyvanse 40 mg daily - takes it only for work

## 2023-11-25 NOTE — Assessment & Plan Note (Signed)
 Chronic Following with gynecologist Not on any medication On zepbound  for OSA and has lost weight

## 2023-11-25 NOTE — Assessment & Plan Note (Addendum)
 Chronic Lost weight - continuing with weight loss efforts Continue zepbound  7.5 mg weekly not on cpap, but feels like she still needs it and will restart using it Referral to pulmonary for further evaluation and likely needs a new machine

## 2023-11-25 NOTE — Assessment & Plan Note (Signed)
 Chronic Check vitamin D level Continue 2000 units vit d daily

## 2023-11-26 ENCOUNTER — Other Ambulatory Visit (HOSPITAL_COMMUNITY): Payer: Self-pay

## 2023-11-30 DIAGNOSIS — Z6832 Body mass index (BMI) 32.0-32.9, adult: Secondary | ICD-10-CM | POA: Diagnosis not present

## 2023-11-30 DIAGNOSIS — Z01419 Encounter for gynecological examination (general) (routine) without abnormal findings: Secondary | ICD-10-CM | POA: Diagnosis not present

## 2023-12-13 ENCOUNTER — Ambulatory Visit (INDEPENDENT_AMBULATORY_CARE_PROVIDER_SITE_OTHER): Admitting: Podiatry

## 2023-12-13 DIAGNOSIS — Z91199 Patient's noncompliance with other medical treatment and regimen due to unspecified reason: Secondary | ICD-10-CM

## 2023-12-13 NOTE — Progress Notes (Signed)
Patient did not come in for appointment

## 2023-12-20 ENCOUNTER — Other Ambulatory Visit: Payer: Self-pay | Admitting: Internal Medicine

## 2023-12-27 ENCOUNTER — Other Ambulatory Visit (HOSPITAL_COMMUNITY): Payer: Self-pay

## 2023-12-27 ENCOUNTER — Encounter: Payer: Self-pay | Admitting: Podiatry

## 2023-12-27 ENCOUNTER — Ambulatory Visit: Admitting: Podiatry

## 2023-12-27 DIAGNOSIS — B351 Tinea unguium: Secondary | ICD-10-CM

## 2023-12-27 MED ORDER — FLUCONAZOLE 150 MG PO TABS
150.0000 mg | ORAL_TABLET | Freq: Once | ORAL | 0 refills | Status: AC
  Filled 2023-12-27: qty 8, 8d supply, fill #0

## 2023-12-28 ENCOUNTER — Ambulatory Visit (INDEPENDENT_AMBULATORY_CARE_PROVIDER_SITE_OTHER)

## 2023-12-28 ENCOUNTER — Encounter (HOSPITAL_BASED_OUTPATIENT_CLINIC_OR_DEPARTMENT_OTHER): Payer: Self-pay

## 2023-12-28 VITALS — BP 164/116 | HR 89 | Ht 64.0 in | Wt 187.0 lb

## 2023-12-28 DIAGNOSIS — G4733 Obstructive sleep apnea (adult) (pediatric): Secondary | ICD-10-CM

## 2023-12-28 NOTE — Progress Notes (Signed)
 Epworth Sleepiness Scale  Use the following scale to choose the most appropriate number for each situation. 0 Would never nod off 1  Slight  chance of nodding off 2 Moderate chance of nodding off 3 High chance of nodding off  Sitting and reading: 2 Watching TV: 3 Sitting, inactive, in a public place (e.g., in a meeting, theater, or dinner event): 2 As a passenger in a car for an hour or more without stopping for a break: 0 Lying down to rest when circumstances permit:0 Sitting and talking to someone: 0 Sitting quietly after a meal without alcohol: 0 In a car, while stopped for a few  minutes in traffic or at a light: 0  TOTOAL: 7

## 2023-12-28 NOTE — Patient Instructions (Signed)
 Complete home sleep test; ordered today.  Follow sleep hygiene as discussed.  Follow up in 6-8 weeks for sleep study review.

## 2023-12-28 NOTE — Progress Notes (Signed)
 Subjective:   Patient ID: Candace [name], female   DOB: 43 y.o.   MRN: [id number]   HPI Patient states that she was doing pretty good but she did not do well with the Lamisil  as she thinks she had hair loss and her nail got significantly better for period of time but has reoccurred   ROS      Objective:  Physical Exam  Neurovascular status intact with patient's hallux nail showing discoloration on the lateral side left localized no indications of ingrown toenail or pain     Assessment:  Probability for fungal infection which may also have some trauma element left big toe     Plan:  H&P reviewed and I have recommended oral Diflucan  along with laser therapy to the area topical medication and allowing the nail to grow out.  Patient understands that we will take around 6 months and I am hopeful this will make a big difference

## 2023-12-28 NOTE — Progress Notes (Signed)
 @Patient  ID: Candace [name], female    DOB: July 05, 1980, 43 y.o.   MRN: [id number]  Chief Complaint  Patient presents with   Establish Care    Referring provider: Geofm Glade PARAS, MD  HPI: Discussed the use of AI scribe software for clinical note transcription with the patient, who gave verbal consent to proceed.  History of Present Illness Candace [name] is a 43 year old female with sleep apnea who presents with persistent daytime sleepiness and fatigue.  She has a long-standing history of sleep apnea, previously diagnosed and treated with CPAP, which she has not used in years. Despite sleeping from midnight to 7 AM, she feels unrested and experiences significant daytime fatigue, especially noticeable when she was out of work due to a herniated disc between June and July. During this time, she was not engaged in daily activities but still felt exhausted.  Her boyfriend has informed her that she snores and experiences witnessed apneas, including gasping for air during sleep. She does not recall waking up gasping but is told she stops breathing at times. Her sleep schedule is somewhat irregular due to difficulty winding down, but she falls asleep quickly once in bed. She wakes once per night to use the bathroom and typically rises between 8:30 and 9 AM.  She has lost 15 to 20 pounds over the last few months, attributed to the use of Zepbound , which she started in July. She is unsure if this weight loss has impacted her sleep issues. She does not currently use any medications to aid sleep but has used melatonin in the past.  Her past medical history includes high blood pressure and high cholesterol. No history of heart attacks, strokes, or heart rhythm problems. She experiences shortness of breath with physical activity, which she attributes to being out of shape. She notes swelling in her ankles if she sits for long periods, particularly when consuming too much salt.  She is a non-smoker and  rarely consumes alcohol. There is no known family history of sleep apnea.  TEST/EVENTS :   12/11/2016 HST:  AHI 13.4/hr with O2 sat nadir 76%  Allergies  Allergen Reactions   Amlodipine      Nausea, migraine    Immunization History  Administered Date(s) Administered   Influenza Split 11/25/2011   Influenza,inj,Quad PF,6+ Mos 10/06/2017   Influenza-Unspecified 11/21/2013, 10/23/2015, 10/26/2016   Tdap 12/30/2015    Past Medical History:  Diagnosis Date   Anxiety    no meds   Depression    no meds   Hypertension    does not take BP med regularly, instructed to take med nest 3 days.    Tobacco History: Social History   Tobacco Use  Smoking Status Never  Smokeless Tobacco Never   Counseling given: Not Answered   Outpatient Medications Prior to Visit  Medication Sig Dispense Refill   fluconazole  (DIFLUCAN ) 150 MG tablet Take 1 tablet (150 mg total) by mouth once for 1 dose. Take one tablet per week for 8 weeks 8 tablet 0   HYDROcodone -acetaminophen  (NORCO/VICODIN) 5-325 MG tablet Take 1 tablet by mouth every 6 (six) hours as needed. 15 tablet 0   hydroquinone  4 % cream Apply topically as directed. Apply every night under breast and every other night to underarms x 3 months 28.35 g 2   lisdexamfetamine (VYVANSE ) 40 MG capsule Take 1 capsule (40 mg) by mouth every morning. 30 capsule 0   metroNIDAZOLE  (METROGEL ) 0.75 % vaginal gel Insert 1 applicatorful vaginally  daily for 5 days. 70 g 0   minoxidil (ROGAINE) 2 % external solution Apply topically.     nebivolol  (BYSTOLIC ) 2.5 MG tablet Take 1 tablet (2.5 mg total) by mouth daily. 90 tablet 3   norethindrone  (AYGESTIN ) 5 MG tablet Take 1 tablet (5 mg total) by mouth daily. 10 tablet 0   nystatin -triamcinolone  ointment (MYCOLOG) Apply topically 2 times daily to affected areas under breast for 1 week 30 g 1   Safety Seal Miscellaneous MISC Brogaine with minoxidil usp 7% and finasteride USP 0.1% apply to the affected areas with  cotton ball or qtip every morning. 30 mL 2   Tazarotene  (ARAZLO ) 0.045 % LOTN Apply 1 application  topically at bedtime. Apply pea sized amount to face at bedtime 2 nights per week x 1 month then increase to 3 nights per week. 45 g 2   tirzepatide  (ZEPBOUND ) 7.5 MG/0.5ML Pen Inject 7.5 mg into the skin once a week. 2 mL 2   Vitamin D , Ergocalciferol , (DRISDOL ) 1.25 MG (50000 UNIT) CAPS capsule Take 1 capsule (50,000 Units total) by mouth every 7 (seven) days. 8 capsule 0   ZEPBOUND  7.5 MG/0.5ML injection vial INJECT 0.5 ML (7.5 MG) UNDER THE SKIN ONCE WEEKLY (0.5ML= 50 UNITS) 2 mL 2   No facility-administered medications prior to visit.     Review of Systems: as per HPI  Constitutional:   No  weight loss, night sweats,  Fevers, chills, fatigue, or  lassitude.  HEENT:   No headaches,  Difficulty swallowing,  Tooth/dental problems, or  Sore throat,                No sneezing, itching, ear ache, nasal congestion, post nasal drip,   CV:  No chest pain,  Orthopnea, PND, swelling in lower extremities, anasarca, dizziness, palpitations, syncope.   GI  No heartburn, indigestion, abdominal pain, nausea, vomiting, diarrhea, change in bowel habits, loss of appetite, bloody stools.   Resp: No shortness of breath with exertion or at rest.  No excess mucus, no productive cough,  No non-productive cough,  No coughing up of blood.  No change in color of mucus.  No wheezing.  No chest wall deformity  Skin: no rash or lesions.  GU: no dysuria, change in color of urine, no urgency or frequency.  No flank pain, no hematuria   MS:  No joint pain or swelling.  No decreased range of motion.  No back pain.    Physical Exam  BP (!) 164/116   Pulse 89   Ht 5' 4 (1.626 m)   Wt 187 lb (84.8 kg)   SpO2 99%   BMI 32.10 kg/m   GEN: A/Ox3; pleasant , NAD, well nourished    HEENT:  [place]/AT,  EACs-clear, TMs-wnl, NOSE-clear, THROAT-clear, no lesions, no postnasal drip or exudate noted. Mallampati 4  NECK:   Supple w/ fair ROM; no JVD; normal carotid impulses w/o bruits; no thyromegaly or nodules palpated; no lymphadenopathy.    RESP  Clear  P & A; w/o, wheezes/ rales/ or rhonchi. no accessory muscle use, no dullness to percussion  CARD:  RRR, no m/r/g, no peripheral edema, pulses intact, no cyanosis or clubbing.  GI:   Soft & nt; nml bowel sounds; no organomegaly or masses detected.   Musco: Warm bil, no deformities or joint swelling noted.   Neuro: alert, no focal deficits noted.    Skin: Warm, no lesions or rashes    Lab Results:  CBC    Component  Value Date/Time   WBC 4.2 05/04/2023 1354   RBC 4.29 05/04/2023 1354   HGB 12.5 05/04/2023 1354   HCT 37.6 05/04/2023 1354   PLT 214.0 05/04/2023 1354   MCV 87.7 05/04/2023 1354   MCV 85.3 07/15/2015 1842   MCH 29.2 07/15/2015 1842   MCH 29.6 08/12/2014 0847   MCHC 33.1 05/04/2023 1354   RDW 13.8 05/04/2023 1354   LYMPHSABS 1.6 05/04/2023 1354   MONOABS 0.4 05/04/2023 1354   EOSABS 0.2 05/04/2023 1354   BASOSABS 0.0 05/04/2023 1354    BMET    Component Value Date/Time   NA 138 11/25/2023 1017   K 3.4 (L) 11/25/2023 1017   CL 104 11/25/2023 1017   CO2 26 11/25/2023 1017   GLUCOSE 86 11/25/2023 1017   BUN 16 11/25/2023 1017   CREATININE 0.65 11/25/2023 1017   CREATININE 0.75 07/15/2015 1818   CALCIUM 9.1 11/25/2023 1017   GFRNONAA >89 08/12/2014 0847   GFRAA >89 08/12/2014 0847    BNP No results found for: BNP  ProBNP No results found for: PROBNP  Imaging: No results found.  Administration History     None           No data to display          No results found for: NITRICOXIDE   Assessment & Plan:   Assessment & Plan OSA (obstructive sleep apnea)  Assessment and Plan Assessment & Plan Obstructive sleep apnea Moderate obstructive sleep apnea diagnosed in 2018 with persistent symptoms. CPAP previously prescribed but not used due to intolerance. Discussed risks of untreated sleep apnea  and treatment options based on severity. - Ordered home sleep study to assess current severity. - Consider CPAP therapy with potential titration study if moderate to severe sleep apnea is confirmed. - Discussed alternative treatments such as Inspire device and mouthpiece fitting if CPAP is not tolerated or if sleep apnea is mild. - Educated on sleep hygiene practices.  Obesity Recent weight loss of 15-20 pounds attributed to Zepbound . Discussed weight management's role in improving sleep apnea. - Continue current weight management regimen with Zepbound . - Encouraged healthy diet and regular exercise.    Return in about 7 weeks (around 02/15/2024) for sleep study review.  Candis Dandy, PA-C 12/28/2023

## 2023-12-29 DIAGNOSIS — Z1231 Encounter for screening mammogram for malignant neoplasm of breast: Secondary | ICD-10-CM | POA: Diagnosis not present

## 2023-12-29 LAB — HM MAMMOGRAPHY

## 2024-01-05 ENCOUNTER — Other Ambulatory Visit (HOSPITAL_COMMUNITY): Payer: Self-pay

## 2024-01-10 ENCOUNTER — Encounter (HOSPITAL_BASED_OUTPATIENT_CLINIC_OR_DEPARTMENT_OTHER)

## 2024-01-10 DIAGNOSIS — G4733 Obstructive sleep apnea (adult) (pediatric): Secondary | ICD-10-CM

## 2024-01-11 DIAGNOSIS — Z0389 Encounter for observation for other suspected diseases and conditions ruled out: Secondary | ICD-10-CM | POA: Diagnosis not present

## 2024-01-19 ENCOUNTER — Ambulatory Visit: Admitting: Dermatology

## 2024-01-19 VITALS — BP 158/115

## 2024-01-19 DIAGNOSIS — L658 Other specified nonscarring hair loss: Secondary | ICD-10-CM | POA: Diagnosis not present

## 2024-01-19 DIAGNOSIS — L700 Acne vulgaris: Secondary | ICD-10-CM | POA: Diagnosis not present

## 2024-01-19 DIAGNOSIS — L304 Erythema intertrigo: Secondary | ICD-10-CM | POA: Diagnosis not present

## 2024-01-19 DIAGNOSIS — L649 Androgenic alopecia, unspecified: Secondary | ICD-10-CM | POA: Diagnosis not present

## 2024-01-19 MED ORDER — SAFETY SEAL MISCELLANEOUS MISC: 2 refills | Status: DC

## 2024-01-19 MED ORDER — SAFETY SEAL MISCELLANEOUS MISC: 2 refills | Status: AC

## 2024-01-19 NOTE — Patient Instructions (Addendum)
 VISIT SUMMARY:  During today's visit, we discussed your progress with hair regrowth and skin treatment. You have seen improvement in hair regrowth, especially with the use of minoxidil, although there is still thinning at the temples. Your skin condition is being managed with Arazlo , glycolic acid toner, ketoconazole cream, and other treatments for specific issues like intertrigo and hyperpigmentation. We have made some adjustments to your treatment plan to further support your progress.  YOUR PLAN:  -TRACTION AND ANDROGENETIC ALOPECIA:  This condition involves hair thinning and loss due to both physical stress on the hair and hormonal factors. You will continue using minoxidil and have added finasteride to help with hormonal hair thinning. Limit heat styling to once a month or every five weeks, and use protective measures like castor oil or jojoba oil with minoxidil. Wear a bonnet or scarf at night to protect your hair. We will reassess your progress in October.  -INTERTRIGO WITH POSTINFLAMMATORY HYPERPIGMENTATION:  Intertrigo is a rash in skin folds that can lead to darkened skin after inflammation. Continue using glycolic acid toner for hyperpigmentation and nystatin  and triamcinolone  for flare-ups, but take breaks after 10 days to prevent skin thinning. Use Zeasorb medicated powder to prevent recurrence.  -ACANTHOSIS NIGRICANS:  This condition causes dark, thickened patches of skin, often in folds. Continue using glycolic acid toner to lighten the hyperpigmentation.  INSTRUCTIONS:  We will reassess your hair regrowth progress in October. Continue with your current skin treatments and follow the updated hair care regimen. If you experience any issues or have questions, please contact our office.     Important Information  Due to recent changes in healthcare laws, you may see results of your pathology and/or laboratory studies on MyChart before the doctors have had a chance to review them.  We understand that in some cases there may be results that are confusing or concerning to you. Please understand that not all results are received at the same time and often the doctors may need to interpret multiple results in order to provide you with the best plan of care or course of treatment. Therefore, we ask that you please give us  2 business days to thoroughly review all your results before contacting the office for clarification. Should we see a critical lab result, you will be contacted sooner.   If You Need Anything After Your Visit  If you have any questions or concerns for your doctor, please call our main line at [phone] If no one answers, please leave a voicemail as directed and we will return your call as soon as possible. Messages left after 4 pm will be answered the following business day.   You may also send us  a message via MyChart. We typically respond to MyChart messages within 1-2 business days.  For prescription refills, please ask your pharmacy to contact our office. Our fax number is [phone].  If you have an urgent issue when the clinic is closed that cannot wait until the next business day, you can page your doctor at the number below.    Please note that while we do our best to be available for urgent issues outside of office hours, we are not available 24/7.   If you have an urgent issue and are unable to reach us , you may choose to seek medical care at your doctor's office, retail clinic, urgent care center, or emergency room.  If you have a medical emergency, please immediately call 911 or go to the emergency department. In the event  of inclement weather, please call our main line at [phone] for an update on the status of any delays or closures.  Dermatology Medication Tips: Please keep the boxes that topical medications come in in order to help keep track of the instructions about where and how to use these. Pharmacies typically print the  medication instructions only on the boxes and not directly on the medication tubes.   If your medication is too expensive, please contact our office at [phone] or send us  a message through MyChart.   We are unable to tell what your co-pay for medications will be in advance as this is different depending on your insurance coverage. However, we may be able to find a substitute medication at lower cost or fill out paperwork to get insurance to cover a needed medication.   If a prior authorization is required to get your medication covered by your insurance company, please allow us  1-2 business days to complete this process.  Drug prices often vary depending on where the prescription is filled and some pharmacies may offer cheaper prices.  The website www.goodrx.com contains coupons for medications through different pharmacies. The prices here do not account for what the cost may be with help from insurance (it may be cheaper with your insurance), but the website can give you the price if you did not use any insurance.  - You can print the associated coupon and take it with your prescription to the pharmacy.  - You may also stop by our office during regular business hours and pick up a GoodRx coupon card.  - If you need your prescription sent electronically to a different pharmacy, notify our office through [facility] or by phone at [phone]

## 2024-01-19 NOTE — Progress Notes (Signed)
° °  Follow-Up Visit   Subjective  Candace [name] is a 43 y.o. female established patient who presents for FOLLOW UP on the diagnoses listed below:  Patient was last evaluated on 07/26/23.   Traction Alopecia: Pt stated that she has not used the Medrock Minoxidil 7%/Clobetasol  compound in 1 mo as she stated that solution was making her hair revert at the roots after being straightened. She would like to try the compound again but in an oil. She stated that she has continued the Viviscal daily and has also incorporated the Vital Protein powder daily since last OV.    Are you nursing, pregnant or trying to conceive? No   The following portions of the chart were reviewed this encounter and updated as appropriate: medications, allergies, medical history  Review of Systems:  No other skin or systemic complaints except as noted in HPI or Assessment and Plan.  Objective  Well appearing patient in no apparent distress; mood and affect are within normal limits.   A focused examination was performed of the following areas: scalp   Relevant exam findings are noted in the Assessment and Plan.          Assessment & Plan   TRACTION & ANDROGENETIC ALOPECIA  Exam: Diffuse thinning of the crown and widening of the midline part with retention of the frontal hairline  improved  Treatment Plan: - Medrock reformulation to Minoxidil 8% Clobetasol  0.05% Finasteride 1% OIL - apply to the affected areas of the scalp QAM.  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 As weather gets warmer increase application doses.    INTERTRIGO Exam: Erythematous macerated patches in body folds  stable  Treatment Plan: - Restart nystatin -TMC cream for flares. Applying BID for up to 7 days. - Continue using ZeasorbAF powder    TRACTION ALOPECIA   This Visit -  Safety Seal Miscellaneous MISC - Minoxidil 8% Clobetasol  0.05% Finasteride 1%  - apply to the affected areas of the scalp QAM. ANDROGENETIC ALOPECIA   This Visit - Safety Seal Miscellaneous MISC - Minoxidil 8% Clobetasol  0.05% Finasteride 1%  - apply to the affected areas of the scalp QAM.  Return in about 10 months (around 11/18/2024) for TRACTION ALOPECIA, ANDROGENETIC ALOPECIA.   Documentation: I have reviewed the above documentation for accuracy and completeness, and I agree with the above.  I, Caralynn Gelber Maranda, CMA II, am acting as scribe for:  Delon Lenis, DO

## 2024-01-30 ENCOUNTER — Encounter: Payer: Self-pay | Admitting: Dermatology

## 2024-02-14 ENCOUNTER — Encounter: Payer: Self-pay | Admitting: *Deleted

## 2024-02-22 ENCOUNTER — Ambulatory Visit (HOSPITAL_BASED_OUTPATIENT_CLINIC_OR_DEPARTMENT_OTHER)

## 2024-02-22 NOTE — Progress Notes (Unsigned)
 "  @Patient  ID: Candace [name], female    DOB: 07/26/1980, 44 y.o.   MRN: [id number]  No chief complaint on file.   Referring provider: Geofm Glade PARAS, MD  HPI: Discussed the use of AI scribe software for clinical note transcription with the patient, who gave verbal consent to proceed.  History of Present Illness    Last OV 12/28/2023: Candace [name] is a 44 year old female with sleep apnea who presents with persistent daytime sleepiness and fatigue.   She has a long-standing history of sleep apnea, previously diagnosed and treated with CPAP, which she has not used in years. Despite sleeping from midnight to 7 AM, she feels unrested and experiences significant daytime fatigue, especially noticeable when she was out of work due to a herniated disc between June and July. During this time, she was not engaged in daily activities but still felt exhausted.   Her boyfriend has informed her that she snores and experiences witnessed apneas, including gasping for air during sleep. She does not recall waking up gasping but is told she stops breathing at times. Her sleep schedule is somewhat irregular due to difficulty winding down, but she falls asleep quickly once in bed. She wakes once per night to use the bathroom and typically rises between 8:30 and 9 AM.   She has lost 15 to 20 pounds over the last few months, attributed to the use of Zepbound , which she started in July. She is unsure if this weight loss has impacted her sleep issues. She does not currently use any medications to aid sleep but has used melatonin in the past.   Her past medical history includes high blood pressure and high cholesterol. No history of heart attacks, strokes, or heart rhythm problems. She experiences shortness of breath with physical activity, which she attributes to being out of shape. She notes swelling in her ankles if she sits for long periods, particularly when consuming too much salt.   She is a non-smoker and  rarely consumes alcohol. There is no known family history of sleep apnea.   TEST/EVENTS :   01/10/2024 HST:  AHI 0.4/hr; no sleep disordered breathing   12/11/2016 HST:  AHI 13.4/hr with O2 sat nadir 76%    Allergies[1]  Immunization History  Administered Date(s) Administered   Influenza Split 11/25/2011   Influenza,inj,Quad PF,6+ Mos 10/06/2017   Influenza-Unspecified 11/21/2013, 10/23/2015, 10/26/2016   Tdap 12/30/2015    Past Medical History:  Diagnosis Date   Anxiety    no meds   Depression    no meds   Hypertension    does not take BP med regularly, instructed to take med nest 3 days.    Tobacco History: Tobacco Use History[2] Counseling given: Not Answered   Outpatient Medications Prior to Visit  Medication Sig Dispense Refill   HYDROcodone -acetaminophen  (NORCO/VICODIN) 5-325 MG tablet Take 1 tablet by mouth every 6 (six) hours as needed. 15 tablet 0   hydroquinone  4 % cream Apply topically as directed. Apply every night under breast and every other night to underarms x 3 months 28.35 g 2   lisdexamfetamine  (VYVANSE ) 40 MG capsule Take 1 capsule (40 mg) by mouth every morning. 30 capsule 0   metroNIDAZOLE  (METROGEL ) 0.75 % vaginal gel Insert 1 applicatorful vaginally daily for 5 days. 70 g 0   minoxidil (ROGAINE) 2 % external solution Apply topically.     nebivolol  (BYSTOLIC ) 2.5 MG tablet Take 1 tablet (2.5 mg total) by mouth daily. 90 tablet  3   norethindrone  (AYGESTIN ) 5 MG tablet Take 1 tablet (5 mg total) by mouth daily. 10 tablet 0   nystatin -triamcinolone  ointment (MYCOLOG) Apply topically 2 times daily to affected areas under breast for 1 week 30 g 1   Safety Seal Miscellaneous MISC Minoxidil 8% Clobetasol  0.05% Finasteride 1%  - apply to the affected areas of the scalp QAM. 30 mL 2   Tazarotene  (ARAZLO ) 0.045 % LOTN Apply 1 application  topically at bedtime. Apply pea sized amount to face at bedtime 2 nights per week x 1 month then increase to 3 nights per  week. 45 g 2   tirzepatide  (ZEPBOUND ) 7.5 MG/0.5ML Pen Inject 7.5 mg into the skin once a week. 2 mL 2   ZEPBOUND  7.5 MG/0.5ML injection vial INJECT 0.5 ML (7.5 MG) UNDER THE SKIN ONCE WEEKLY (0.5ML= 50 UNITS) 2 mL 2   No facility-administered medications prior to visit.     Review of Systems:   Constitutional:   No  weight loss, night sweats,  Fevers, chills, fatigue, or  lassitude.  HEENT:   No headaches,  Difficulty swallowing,  Tooth/dental problems, or  Sore throat,                No sneezing, itching, ear ache, nasal congestion, post nasal drip,   CV:  No chest pain,  Orthopnea, PND, swelling in lower extremities, anasarca, dizziness, palpitations, syncope.   GI  No heartburn, indigestion, abdominal pain, nausea, vomiting, diarrhea, change in bowel habits, loss of appetite, bloody stools.   Resp: No shortness of breath with exertion or at rest.  No excess mucus, no productive cough,  No non-productive cough,  No coughing up of blood.  No change in color of mucus.  No wheezing.  No chest wall deformity  Skin: no rash or lesions.  GU: no dysuria, change in color of urine, no urgency or frequency.  No flank pain, no hematuria   MS:  No joint pain or swelling.  No decreased range of motion.  No back pain.    Physical Exam  There were no vitals taken for this visit.  GEN: A/Ox3; pleasant , NAD, well nourished    HEENT:  [place]/AT,  EACs-clear, TMs-wnl, NOSE-clear, THROAT-clear, no lesions, no postnasal drip or exudate noted.   NECK:  Supple w/ fair ROM; no JVD; normal carotid impulses w/o bruits; no thyromegaly or nodules palpated; no lymphadenopathy.    RESP  Clear  P & A; w/o, wheezes/ rales/ or rhonchi. no accessory muscle use, no dullness to percussion  CARD:  RRR, no m/r/g, no peripheral edema, pulses intact, no cyanosis or clubbing.  GI:   Soft & nt; nml bowel sounds; no organomegaly or masses detected.   Musco: Warm bil, no deformities or joint swelling noted.    Neuro: alert, no focal deficits noted.    Skin: Warm, no lesions or rashes    Lab Results:  CBC    Component Value Date/Time   WBC 4.2 05/04/2023 1354   RBC 4.29 05/04/2023 1354   HGB 12.5 05/04/2023 1354   HCT 37.6 05/04/2023 1354   PLT 214.0 05/04/2023 1354   MCV 87.7 05/04/2023 1354   MCV 85.3 07/15/2015 1842   MCH 29.2 07/15/2015 1842   MCH 29.6 08/12/2014 0847   MCHC 33.1 05/04/2023 1354   RDW 13.8 05/04/2023 1354   LYMPHSABS 1.6 05/04/2023 1354   MONOABS 0.4 05/04/2023 1354   EOSABS 0.2 05/04/2023 1354   BASOSABS 0.0 05/04/2023 1354  BMET    Component Value Date/Time   NA 138 11/25/2023 1017   K 3.4 (L) 11/25/2023 1017   CL 104 11/25/2023 1017   CO2 26 11/25/2023 1017   GLUCOSE 86 11/25/2023 1017   BUN 16 11/25/2023 1017   CREATININE 0.65 11/25/2023 1017   CREATININE 0.75 07/15/2015 1818   CALCIUM 9.1 11/25/2023 1017   GFRNONAA >89 08/12/2014 0847   GFRAA >89 08/12/2014 0847    BNP No results found for: BNP  ProBNP No results found for: PROBNP  Imaging: No results found.  Administration History     None           No data to display          No results found for: NITRICOXIDE   Assessment & Plan:   Assessment & Plan    No follow-ups on file.  Candis Dandy, PA-C 02/22/2024      [1]  Allergies Allergen Reactions   Amlodipine      Nausea, migraine  [2]  Social History Tobacco Use  Smoking Status Never  Smokeless Tobacco Never   "

## 2024-02-29 ENCOUNTER — Other Ambulatory Visit

## 2024-03-09 ENCOUNTER — Encounter: Payer: Self-pay | Admitting: Internal Medicine

## 2024-03-31 ENCOUNTER — Ambulatory Visit

## 2024-04-25 ENCOUNTER — Ambulatory Visit

## 2024-05-25 ENCOUNTER — Ambulatory Visit: Admitting: Internal Medicine
